# Patient Record
Sex: Female | Born: 1935 | Race: White | Hispanic: No | Smoking: Never smoker
Health system: Southern US, Community
[De-identification: ages and names within clinical notes are randomized; demographics above are authoritative.]

## PROBLEM LIST (undated history)

## (undated) DIAGNOSIS — F0390 Unspecified dementia without behavioral disturbance: Secondary | ICD-10-CM

## (undated) DIAGNOSIS — Z8051 Family history of malignant neoplasm of kidney: Secondary | ICD-10-CM

## (undated) DIAGNOSIS — Z8042 Family history of malignant neoplasm of prostate: Secondary | ICD-10-CM

## (undated) DIAGNOSIS — Z803 Family history of malignant neoplasm of breast: Secondary | ICD-10-CM

## (undated) DIAGNOSIS — C561 Malignant neoplasm of right ovary: Secondary | ICD-10-CM

## (undated) DIAGNOSIS — I10 Essential (primary) hypertension: Secondary | ICD-10-CM

## (undated) DIAGNOSIS — R112 Nausea with vomiting, unspecified: Secondary | ICD-10-CM

## (undated) DIAGNOSIS — I509 Heart failure, unspecified: Secondary | ICD-10-CM

## (undated) DIAGNOSIS — E785 Hyperlipidemia, unspecified: Secondary | ICD-10-CM

## (undated) DIAGNOSIS — T4145XA Adverse effect of unspecified anesthetic, initial encounter: Secondary | ICD-10-CM

## (undated) DIAGNOSIS — Z9889 Other specified postprocedural states: Secondary | ICD-10-CM

## (undated) DIAGNOSIS — T8859XA Other complications of anesthesia, initial encounter: Secondary | ICD-10-CM

## (undated) DIAGNOSIS — J910 Malignant pleural effusion: Secondary | ICD-10-CM

## (undated) DIAGNOSIS — N390 Urinary tract infection, site not specified: Secondary | ICD-10-CM

## (undated) DIAGNOSIS — Z8489 Family history of other specified conditions: Secondary | ICD-10-CM

## (undated) HISTORY — DX: Malignant pleural effusion: J91.0

## (undated) HISTORY — PX: INNER EAR SURGERY: SHX679

## (undated) HISTORY — PX: CYSTECTOMY: SUR359

## (undated) HISTORY — DX: Family history of malignant neoplasm of prostate: Z80.42

## (undated) HISTORY — DX: Urinary tract infection, site not specified: N39.0

## (undated) HISTORY — DX: Family history of malignant neoplasm of kidney: Z80.51

## (undated) HISTORY — DX: Malignant neoplasm of right ovary: C56.1

## (undated) HISTORY — PX: ABDOMINAL HYSTERECTOMY: SHX81

## (undated) HISTORY — DX: Family history of malignant neoplasm of breast: Z80.3

## (undated) HISTORY — DX: Hyperlipidemia, unspecified: E78.5

---

## 2012-07-21 ENCOUNTER — Ambulatory Visit: Payer: Self-pay | Admitting: Orthopedic Surgery

## 2016-03-27 ENCOUNTER — Other Ambulatory Visit: Payer: Self-pay | Admitting: Radiology

## 2016-12-01 ENCOUNTER — Emergency Department (HOSPITAL_COMMUNITY): Payer: Medicare Other

## 2016-12-01 ENCOUNTER — Encounter (HOSPITAL_COMMUNITY): Payer: Self-pay | Admitting: Emergency Medicine

## 2016-12-01 ENCOUNTER — Emergency Department (HOSPITAL_COMMUNITY)
Admission: EM | Admit: 2016-12-01 | Discharge: 2016-12-01 | Disposition: A | Discharge status: Home / Self Care | Payer: Medicare Other | Attending: Emergency Medicine | Admitting: Emergency Medicine

## 2016-12-01 DIAGNOSIS — I10 Essential (primary) hypertension: Secondary | ICD-10-CM | POA: Diagnosis not present

## 2016-12-01 DIAGNOSIS — F0390 Unspecified dementia without behavioral disturbance: Secondary | ICD-10-CM | POA: Diagnosis not present

## 2016-12-01 DIAGNOSIS — Z79899 Other long term (current) drug therapy: Secondary | ICD-10-CM | POA: Insufficient documentation

## 2016-12-01 DIAGNOSIS — Z7982 Long term (current) use of aspirin: Secondary | ICD-10-CM | POA: Diagnosis not present

## 2016-12-01 DIAGNOSIS — N390 Urinary tract infection, site not specified: Secondary | ICD-10-CM | POA: Diagnosis not present

## 2016-12-01 DIAGNOSIS — R110 Nausea: Secondary | ICD-10-CM | POA: Diagnosis present

## 2016-12-01 HISTORY — DX: Unspecified dementia, unspecified severity, without behavioral disturbance, psychotic disturbance, mood disturbance, and anxiety: F03.90

## 2016-12-01 HISTORY — DX: Essential (primary) hypertension: I10

## 2016-12-01 LAB — COMPREHENSIVE METABOLIC PANEL
ALBUMIN: 3.8 g/dL (ref 3.5–5.0)
ALK PHOS: 73 U/L (ref 38–126)
ALT: 21 U/L (ref 14–54)
AST: 27 U/L (ref 15–41)
Anion gap: 10 (ref 5–15)
BILIRUBIN TOTAL: 0.9 mg/dL (ref 0.3–1.2)
BUN: 8 mg/dL (ref 6–20)
CO2: 26 mmol/L (ref 22–32)
CREATININE: 0.73 mg/dL (ref 0.44–1.00)
Calcium: 9.8 mg/dL (ref 8.9–10.3)
Chloride: 94 mmol/L — ABNORMAL LOW (ref 101–111)
GFR calc Af Amer: >60 mL/min (ref >60)
GFR calc non Af Amer: >60 mL/min (ref >60)
GLUCOSE: 109 mg/dL — AB (ref 65–99)
POTASSIUM: 3.9 mmol/L (ref 3.5–5.1)
Sodium: 130 mmol/L — ABNORMAL LOW (ref 135–145)
TOTAL PROTEIN: 6.9 g/dL (ref 6.5–8.1)

## 2016-12-01 LAB — URINALYSIS, ROUTINE W REFLEX MICROSCOPIC
Bilirubin Urine: NEGATIVE
GLUCOSE, UA: NEGATIVE mg/dL
Hgb urine dipstick: NEGATIVE
KETONES UR: 5 mg/dL — AB
NITRITE: NEGATIVE
PROTEIN: NEGATIVE mg/dL
Specific Gravity, Urine: 1.009 (ref 1.005–1.030)
pH: 7 (ref 5.0–8.0)

## 2016-12-01 LAB — CBC WITH DIFFERENTIAL/PLATELET
BASOS ABS: 0 10*3/uL (ref 0.0–0.1)
BASOS PCT: 0 %
Eosinophils Absolute: 0 10*3/uL (ref 0.0–0.7)
Eosinophils Relative: 0 %
HCT: 37.3 % (ref 36.0–46.0)
HEMOGLOBIN: 13.1 g/dL (ref 12.0–15.0)
LYMPHS PCT: 21 %
Lymphs Abs: 1.3 10*3/uL (ref 0.7–4.0)
MCH: 31.2 pg (ref 26.0–34.0)
MCHC: 35.1 g/dL (ref 30.0–36.0)
MCV: 88.8 fL (ref 78.0–100.0)
Monocytes Absolute: 0.6 10*3/uL (ref 0.1–1.0)
Monocytes Relative: 9 %
NEUTROS ABS: 4.3 10*3/uL (ref 1.7–7.7)
NEUTROS PCT: 70 %
Platelets: 280 10*3/uL (ref 150–400)
RBC: 4.2 MIL/uL (ref 3.87–5.11)
RDW: 12.6 % (ref 11.5–15.5)
WBC: 6.1 10*3/uL (ref 4.0–10.5)

## 2016-12-01 LAB — TROPONIN I: Troponin I: <0.03 ng/mL (ref <0.03)

## 2016-12-01 LAB — LIPASE, BLOOD: Lipase: 26 U/L (ref 11–51)

## 2016-12-01 MED ORDER — CEPHALEXIN 500 MG PO CAPS: 500.0000 mg | ORAL_CAPSULE | Freq: Four times a day (QID) | ORAL | 0 refills | Status: DC

## 2016-12-01 MED ORDER — DEXTROSE 5 % IV SOLN
1.0000 g | Freq: Once | INTRAVENOUS | Status: AC
  Administered 2016-12-01: 1 g via INTRAVENOUS
  Filled 2016-12-01: qty 10

## 2016-12-01 MED ORDER — SODIUM CHLORIDE 0.9 % IV BOLUS (SEPSIS)
1000.0000 mL | Freq: Once | INTRAVENOUS | Status: AC
  Administered 2016-12-01: 1000 mL via INTRAVENOUS

## 2016-12-01 NOTE — ED Triage Notes (Signed)
Per family, patient has been c/o nausea x1 month with decrease in appetite. Patient also reports feeling hot but family states no fever and skin doesn't feel hot. Denies any vomiting, diarrhea, pain or urinary symptoms. Family states she just states "she doesn't feel good." Patient has hx of dementia but has not had any increase in confusion per family.

## 2016-12-01 NOTE — Discharge Instructions (Signed)
You have a urinary tract infection. Increase fluids. Prescription for antibiotic to start Monday morning.

## 2016-12-01 NOTE — ED Provider Notes (Signed)
[facility] DEPT Provider Note   CSN: [id number] Arrival date & time: 12/01/16  1117     History   Chief Complaint Chief Complaint  Patient presents with  . Nausea    HPI Denise Bonilla is a 83 y.o. female.  Level V caveat for dementia. Most of history obtained from 2 daughters. Family reports nausea for one month with poor appetite. She feels hot occasionally but no obvious fever. No weight loss, chest pain, dyspnea, cough, dysuria. Severity of symptoms is mild to moderate.      Past Medical History:  Diagnosis Date  . Dementia   . High cholesterol   . Hypertension     There are no active problems to display for this patient.   Past Surgical History:  Procedure Laterality Date  . CYSTECTOMY      OB History    Gravida Para Term Preterm AB Living   3 3 3     3    SAB TAB Ectopic Multiple Live Births                   Home Medications    Prior to Admission medications   Medication Sig Start Date End Date Taking? Authorizing Provider  aspirin EC 81 MG tablet Take 81 mg by mouth daily.   Yes [provider]  bisoprolol (ZEBETA) 10 MG tablet Take 10 mg by mouth daily.   Yes [provider]  lisinopril (PRINIVIL,ZESTRIL) 10 MG tablet Take 10 mg by mouth daily.   Yes [provider]  Multiple Vitamins-Minerals (MULTI FOR HER 50+) TABS Take 1 tablet by mouth daily.   Yes [provider]  Omega-3 Fatty Acids (FISH OIL) 500 MG CAPS Take 1 capsule by mouth daily.   Yes [provider]  simvastatin (ZOCOR) 20 MG tablet Take 20 mg by mouth at bedtime.    Yes [provider]  cephALEXin (KEFLEX) 500 MG capsule Take 1 capsule (500 mg total) by mouth 4 (four) times daily. 12/01/16   Nat Christen, MD    Family History Family History  Problem Relation Age of Onset  . Cancer Other   . Hyperlipidemia Other   . Hypertension Other   . Heart failure Mother     Social History Social History  Substance Use Topics  .  Smoking status: Never Smoker  . Smokeless tobacco: Never Used  . Alcohol use No     Allergies   Tetanus toxoids and Codeine   Review of Systems Review of Systems  Unable to perform ROS: Dementia (Dementia)     Physical Exam Updated Vital Signs BP (!) 168/71   Pulse 69   Temp 97.9 F (36.6 C) (Oral)   Ht 5\' 2"  (1.575 m)   Wt 72.6 kg (160 lb)   SpO2 95%   BMI 29.26 kg/m   Physical Exam  Constitutional: She is oriented to person, place, and time.  Pleasant, no acute distress  HENT:  Head: Normocephalic and atraumatic.  Eyes: Conjunctivae are normal.  Neck: Neck supple.  Cardiovascular: Normal rate and regular rhythm.   Pulmonary/Chest: Effort normal and breath sounds normal.  Abdominal: Soft. Bowel sounds are normal.  Musculoskeletal: Normal range of motion.  Neurological: She is alert and oriented to person, place, and time.  Skin: Skin is warm and dry.  Psychiatric:  Minimal dementia noted  Nursing note and vitals reviewed.    ED Treatments / Results  Labs (all labs ordered are listed, but only abnormal results are displayed)  Labs Reviewed  COMPREHENSIVE METABOLIC PANEL - Abnormal; Notable for the following:       Result Value   Sodium 130 (*)    Chloride 94 (*)    Glucose, Bld 109 (*)    All other components within normal limits  URINALYSIS, ROUTINE W REFLEX MICROSCOPIC - Abnormal; Notable for the following:    APPearance CLOUDY (*)    Ketones, ur 5 (*)    Leukocytes, UA MODERATE (*)    Bacteria, UA MANY (*)    Squamous Epithelial / LPF TOO NUMEROUS TO COUNT (*)    Non Squamous Epithelial 6-30 (*)    All other components within normal limits  URINE CULTURE  CBC WITH DIFFERENTIAL/PLATELET  LIPASE, BLOOD  TROPONIN I    EKG  EKG Interpretation  Date/Time:  Sunday December 01 2016 12:16:54 EDT Ventricular Rate:  66 PR Interval:    QRS Duration: 98 QT Interval:  398 QTC Calculation: 417 R Axis:   64 Text Interpretation:  Sinus rhythm Confirmed  by Nat Christen [phone]) on 12/01/2016 12:43:24 PM       Radiology Dg Chest 2 View  Result Date: 12/01/2016 CLINICAL DATA:  Weakness and not eating well.  Possible fever. EXAM: CHEST  2 VIEW COMPARISON:  None. FINDINGS: Lungs are adequately inflated with opacification over the lower 60% of the left thorax likely large effusion with associated atelectasis. Cannot exclude infection over the left mid to lower lung. Mild cardiomegaly. Minimal calcification over the thoracic aorta. Degenerative change of the spine. IMPRESSION: Moderate opacification over the left mid to lower lung likely large effusion with atelectasis. Cannot exclude infection in the left mid to lower lung. Recommend follow-up to resolution. Cardiomegaly. Electronically Signed   By: Marin Olp M.D.   On: 12/01/2016 13:16    Procedures Procedures (including critical care time)  Medications Ordered in ED Medications  sodium chloride 0.9 % bolus 1,000 mL (0 mLs Intravenous Stopping Infusion hung by another clincian 12/01/16 1405)  cefTRIAXone (ROCEPHIN) 1 g in dextrose 5 % 50 mL IVPB (1 g Intravenous New Bag/Given 12/01/16 1413)     Initial Impression / Assessment and Plan / ED Course  I have reviewed the triage vital signs and the nursing notes.  Pertinent labs & imaging results that were available during my care of the patient were reviewed by me and considered in my medical decision making (see chart for details).     Patient appears in no acute distress. She does have evidence of a urinary tract infection. IV Rocephin administered. Discharge medication Keflex 500 mg. This was discussed with the 2 daughters. Patient has primary care follow-up.  Final Clinical Impressions(s) / ED Diagnoses   Final diagnoses:  Urinary tract infection without hematuria, site unspecified    New Prescriptions New Prescriptions   CEPHALEXIN (KEFLEX) 500 MG CAPSULE    Take 1 capsule (500 mg total) by mouth 4 (four) times daily.     Nat Christen, MD 12/01/16 [phone]

## 2016-12-03 LAB — URINE CULTURE

## 2016-12-07 ENCOUNTER — Encounter (HOSPITAL_COMMUNITY): Payer: Self-pay | Admitting: *Deleted

## 2016-12-07 ENCOUNTER — Inpatient Hospital Stay (HOSPITAL_COMMUNITY)
Admission: EM | Admit: 2016-12-07 | Discharge: 2016-12-13 | DRG: 194 | Disposition: A | Discharge status: Home / Self Care | Payer: Medicare Other | Attending: Internal Medicine | Admitting: Internal Medicine

## 2016-12-07 ENCOUNTER — Emergency Department (HOSPITAL_COMMUNITY): Payer: Medicare Other

## 2016-12-07 ENCOUNTER — Other Ambulatory Visit: Payer: Self-pay

## 2016-12-07 DIAGNOSIS — C801 Malignant (primary) neoplasm, unspecified: Secondary | ICD-10-CM

## 2016-12-07 DIAGNOSIS — E876 Hypokalemia: Secondary | ICD-10-CM | POA: Diagnosis present

## 2016-12-07 DIAGNOSIS — J90 Pleural effusion, not elsewhere classified: Secondary | ICD-10-CM | POA: Diagnosis present

## 2016-12-07 DIAGNOSIS — E222 Syndrome of inappropriate secretion of antidiuretic hormone: Secondary | ICD-10-CM | POA: Diagnosis not present

## 2016-12-07 DIAGNOSIS — I5032 Chronic diastolic (congestive) heart failure: Secondary | ICD-10-CM | POA: Diagnosis present

## 2016-12-07 DIAGNOSIS — E042 Nontoxic multinodular goiter: Secondary | ICD-10-CM

## 2016-12-07 DIAGNOSIS — I110 Hypertensive heart disease with heart failure: Secondary | ICD-10-CM | POA: Diagnosis present

## 2016-12-07 DIAGNOSIS — E785 Hyperlipidemia, unspecified: Secondary | ICD-10-CM | POA: Diagnosis present

## 2016-12-07 DIAGNOSIS — R531 Weakness: Secondary | ICD-10-CM | POA: Diagnosis not present

## 2016-12-07 DIAGNOSIS — N390 Urinary tract infection, site not specified: Secondary | ICD-10-CM | POA: Diagnosis present

## 2016-12-07 DIAGNOSIS — J9811 Atelectasis: Secondary | ICD-10-CM | POA: Diagnosis present

## 2016-12-07 DIAGNOSIS — J181 Lobar pneumonia, unspecified organism: Secondary | ICD-10-CM | POA: Diagnosis present

## 2016-12-07 DIAGNOSIS — I509 Heart failure, unspecified: Secondary | ICD-10-CM | POA: Diagnosis not present

## 2016-12-07 DIAGNOSIS — Z9889 Other specified postprocedural states: Secondary | ICD-10-CM

## 2016-12-07 DIAGNOSIS — Z09 Encounter for follow-up examination after completed treatment for conditions other than malignant neoplasm: Secondary | ICD-10-CM

## 2016-12-07 DIAGNOSIS — F0390 Unspecified dementia without behavioral disturbance: Secondary | ICD-10-CM | POA: Diagnosis present

## 2016-12-07 DIAGNOSIS — I10 Essential (primary) hypertension: Secondary | ICD-10-CM

## 2016-12-07 DIAGNOSIS — Z7982 Long term (current) use of aspirin: Secondary | ICD-10-CM | POA: Diagnosis not present

## 2016-12-07 DIAGNOSIS — E869 Volume depletion, unspecified: Secondary | ICD-10-CM | POA: Diagnosis present

## 2016-12-07 DIAGNOSIS — I340 Nonrheumatic mitral (valve) insufficiency: Secondary | ICD-10-CM | POA: Diagnosis not present

## 2016-12-07 DIAGNOSIS — R188 Other ascites: Secondary | ICD-10-CM

## 2016-12-07 DIAGNOSIS — E041 Nontoxic single thyroid nodule: Secondary | ICD-10-CM | POA: Diagnosis present

## 2016-12-07 DIAGNOSIS — E871 Hypo-osmolality and hyponatremia: Secondary | ICD-10-CM | POA: Diagnosis present

## 2016-12-07 DIAGNOSIS — Z79899 Other long term (current) drug therapy: Secondary | ICD-10-CM

## 2016-12-07 LAB — URINALYSIS, ROUTINE W REFLEX MICROSCOPIC
Bacteria, UA: NONE SEEN
Bilirubin Urine: NEGATIVE
GLUCOSE, UA: NEGATIVE mg/dL
KETONES UR: NEGATIVE mg/dL
LEUKOCYTES UA: NEGATIVE
Nitrite: NEGATIVE
PH: 7 (ref 5.0–8.0)
Protein, ur: NEGATIVE mg/dL
Specific Gravity, Urine: 1.021 (ref 1.005–1.030)

## 2016-12-07 LAB — CBC
HCT: 37.1 % (ref 36.0–46.0)
Hemoglobin: 13 g/dL (ref 12.0–15.0)
MCH: 31.1 pg (ref 26.0–34.0)
MCHC: 35 g/dL (ref 30.0–36.0)
MCV: 88.8 fL (ref 78.0–100.0)
PLATELETS: 344 10*3/uL (ref 150–400)
RBC: 4.18 MIL/uL (ref 3.87–5.11)
RDW: 12.6 % (ref 11.5–15.5)
WBC: 6.5 10*3/uL (ref 4.0–10.5)

## 2016-12-07 LAB — BASIC METABOLIC PANEL
Anion gap: 7 (ref 5–15)
BUN: 5 mg/dL — AB (ref 6–20)
CHLORIDE: 92 mmol/L — AB (ref 101–111)
CO2: 24 mmol/L (ref 22–32)
CREATININE: 0.7 mg/dL (ref 0.44–1.00)
Calcium: 9.3 mg/dL (ref 8.9–10.3)
GFR calc Af Amer: >60 mL/min (ref >60)
GFR calc non Af Amer: >60 mL/min (ref >60)
Glucose, Bld: 118 mg/dL — ABNORMAL HIGH (ref 65–99)
Potassium: 4.7 mmol/L (ref 3.5–5.1)
SODIUM: 123 mmol/L — AB (ref 135–145)

## 2016-12-07 LAB — BRAIN NATRIURETIC PEPTIDE: B Natriuretic Peptide: 131.2 pg/mL — ABNORMAL HIGH (ref 0.0–100.0)

## 2016-12-07 LAB — OSMOLALITY, URINE: OSMOLALITY UR: 168 mosm/kg — AB (ref 300–900)

## 2016-12-07 LAB — ALBUMIN: ALBUMIN: 3.6 g/dL (ref 3.5–5.0)

## 2016-12-07 LAB — SODIUM, URINE, RANDOM: SODIUM UR: 36 mmol/L

## 2016-12-07 LAB — TROPONIN I: Troponin I: <0.03 ng/mL (ref <0.03)

## 2016-12-07 MED ORDER — IOPAMIDOL (ISOVUE-370) INJECTION 76%
INTRAVENOUS | Status: AC
  Administered 2016-12-07: 100 mL via INTRAVENOUS
  Filled 2016-12-07: qty 100

## 2016-12-07 MED ORDER — CEPHALEXIN 250 MG PO CAPS: 500.0000 mg | ORAL_CAPSULE | Freq: Four times a day (QID) | ORAL | Status: DC

## 2016-12-07 MED ORDER — MENTHOL 3 MG MT LOZG
1.0000 | LOZENGE | OROMUCOSAL | Status: DC | PRN
  Filled 2016-12-07: qty 9

## 2016-12-07 MED ORDER — SODIUM CHLORIDE 0.9% FLUSH: 3.0000 mL | INTRAVENOUS | Status: DC | PRN

## 2016-12-07 MED ORDER — ASPIRIN EC 81 MG PO TBEC
81.0000 mg | DELAYED_RELEASE_TABLET | Freq: Every day | ORAL | Status: DC
  Administered 2016-12-08 – 2016-12-13 (×6): 81 mg via ORAL
  Filled 2016-12-07 (×6): qty 1

## 2016-12-07 MED ORDER — ACETAMINOPHEN 325 MG PO TABS
650.0000 mg | ORAL_TABLET | Freq: Four times a day (QID) | ORAL | Status: DC | PRN
  Administered 2016-12-10: 650 mg via ORAL
  Filled 2016-12-07: qty 2

## 2016-12-07 MED ORDER — OMEGA-3-ACID ETHYL ESTERS 1 G PO CAPS
1.0000 g | ORAL_CAPSULE | Freq: Every day | ORAL | Status: DC
  Administered 2016-12-10 – 2016-12-13 (×4): 1 g via ORAL
  Filled 2016-12-07 (×6): qty 1

## 2016-12-07 MED ORDER — FUROSEMIDE 10 MG/ML IJ SOLN
40.0000 mg | Freq: Two times a day (BID) | INTRAMUSCULAR | Status: DC
  Administered 2016-12-07 – 2016-12-09 (×4): 40 mg via INTRAVENOUS
  Filled 2016-12-07 (×4): qty 4

## 2016-12-07 MED ORDER — ONDANSETRON HCL 4 MG/2ML IJ SOLN
4.0000 mg | Freq: Four times a day (QID) | INTRAMUSCULAR | Status: DC | PRN
  Administered 2016-12-09: 4 mg via INTRAVENOUS
  Filled 2016-12-07: qty 2

## 2016-12-07 MED ORDER — FUROSEMIDE 10 MG/ML IJ SOLN: 20.0000 mg | Freq: Once | INTRAMUSCULAR | Status: DC

## 2016-12-07 MED ORDER — ENOXAPARIN SODIUM 40 MG/0.4ML ~~LOC~~ SOLN
40.0000 mg | SUBCUTANEOUS | Status: DC
  Administered 2016-12-07 – 2016-12-12 (×6): 40 mg via SUBCUTANEOUS
  Filled 2016-12-07 (×6): qty 0.4

## 2016-12-07 MED ORDER — SODIUM CHLORIDE 0.9 % IV SOLN: 250.0000 mL | INTRAVENOUS | Status: DC | PRN

## 2016-12-07 MED ORDER — ACETAMINOPHEN 650 MG RE SUPP: 650.0000 mg | Freq: Four times a day (QID) | RECTAL | Status: DC | PRN

## 2016-12-07 MED ORDER — SIMVASTATIN 20 MG PO TABS
20.0000 mg | ORAL_TABLET | Freq: Every day | ORAL | Status: DC
  Administered 2016-12-07 – 2016-12-12 (×6): 20 mg via ORAL
  Filled 2016-12-07 (×6): qty 1

## 2016-12-07 MED ORDER — MULTI FOR HER 50+ PO TABS: 1.0000 | ORAL_TABLET | Freq: Every day | ORAL | Status: DC

## 2016-12-07 MED ORDER — ADULT MULTIVITAMIN W/MINERALS CH
1.0000 | ORAL_TABLET | Freq: Every day | ORAL | Status: DC
  Administered 2016-12-10 – 2016-12-13 (×4): 1 via ORAL
  Filled 2016-12-07 (×6): qty 1

## 2016-12-07 MED ORDER — FISH OIL 500 MG PO CAPS: 1.0000 | ORAL_CAPSULE | Freq: Every day | ORAL | Status: DC

## 2016-12-07 MED ORDER — SODIUM CHLORIDE 0.9% FLUSH
3.0000 mL | Freq: Two times a day (BID) | INTRAVENOUS | Status: DC
  Administered 2016-12-07 – 2016-12-13 (×9): 3 mL via INTRAVENOUS

## 2016-12-07 MED ORDER — ONDANSETRON HCL 4 MG PO TABS: 4.0000 mg | ORAL_TABLET | Freq: Four times a day (QID) | ORAL | Status: DC | PRN

## 2016-12-07 MED ORDER — CEPHALEXIN 500 MG PO CAPS
500.0000 mg | ORAL_CAPSULE | Freq: Four times a day (QID) | ORAL | Status: DC
  Administered 2016-12-08 (×3): 500 mg via ORAL
  Filled 2016-12-07: qty 1
  Filled 2016-12-07: qty 2
  Filled 2016-12-07 (×2): qty 1

## 2016-12-07 MED ORDER — SODIUM CHLORIDE 0.9% FLUSH
3.0000 mL | Freq: Two times a day (BID) | INTRAVENOUS | Status: DC
  Administered 2016-12-11 – 2016-12-13 (×2): 3 mL via INTRAVENOUS

## 2016-12-07 NOTE — ED Triage Notes (Signed)
Pt and family reports that pt was seen last week at [facility] for fatigue/weakness and diagnosed with UTI, has been taking antibiotics as prescribed. Family states pt is still not acting like she feels well and decreased appetite/intake. Pt having recent cough over past few days. Airway intact at triage.

## 2016-12-07 NOTE — ED Provider Notes (Signed)
[facility] DEPT Provider Note   CSN: [id number] Arrival date & time: 12/07/16  1202     History   Chief Complaint Chief Complaint  Patient presents with  . Weakness    HPI Denise Bonilla is a 83 y.o. female.  83 year old female with past mental history including dementia, hypertension, hyperlipidemia who presents with weakness. The patient was evaluated at [facility] on 6/24 for weakness and nausea. She was diagnosed with a UTI and started on Keflex. Tomorrow is her last dose of antibiotic and she has been compliant with medications. Family notes that the nausea improved with the patient has continued to act like she does not feel well and had decreased appetite and decreased PO intake. She has had mildly increased cough recently but she denies any shortness of breath. They have noticed that she is mildly dyspneic when she walks. She denies any vomiting, diarrhea, or complaints of pain. Family has noticed increased leg swelling recently. She told them that she felt hot last night but they have not had any measured fevers. She does not have a personal history of heart failure but strong family history of heart failure. Daughter thinks her weight might be up a few pounds.  LEVEL 5 CAVEAT DUE TO DEMENTIA   The history is provided by a relative.    Past Medical History:  Diagnosis Date  . Dementia   . High cholesterol   . Hypertension     There are no active problems to display for this patient.   Past Surgical History:  Procedure Laterality Date  . CYSTECTOMY      OB History    Gravida Para Term Preterm AB Living   3 3 3     3    SAB TAB Ectopic Multiple Live Births                   Home Medications    Prior to Admission medications   Medication Sig Start Date End Date Taking? Authorizing Provider  aspirin EC 81 MG tablet Take 81 mg by mouth daily.    [provider]  bisoprolol (ZEBETA) 10 MG tablet Take 10 mg by mouth daily.    [provider]   cephALEXin (KEFLEX) 500 MG capsule Take 1 capsule (500 mg total) by mouth 4 (four) times daily. 12/01/16   Nat Christen, MD  lisinopril (PRINIVIL,ZESTRIL) 10 MG tablet Take 10 mg by mouth daily.    [provider]  Multiple Vitamins-Minerals (MULTI FOR HER 50+) TABS Take 1 tablet by mouth daily.    [provider]  Omega-3 Fatty Acids (FISH OIL) 500 MG CAPS Take 1 capsule by mouth daily.    [provider]  simvastatin (ZOCOR) 20 MG tablet Take 20 mg by mouth at bedtime.     [provider]    Family History Family History  Problem Relation Age of Onset  . Cancer Other   . Hyperlipidemia Other   . Hypertension Other   . Heart failure Mother     Social History Social History  Substance Use Topics  . Smoking status: Never Smoker  . Smokeless tobacco: Never Used  . Alcohol use No     Allergies   Tetanus toxoids and Codeine   Review of Systems Review of Systems  Unable to perform ROS: Dementia     Physical Exam Updated Vital Signs BP (!) 157/72   Pulse (!) 59   Temp 97.9 F (36.6 C) (Oral)   Resp (!) 21  SpO2 95%   Physical Exam  Constitutional: She appears well-developed and well-nourished. No distress.  HENT:  Head: Normocephalic and atraumatic.  Eyes: Conjunctivae are normal. Pupils are equal, round, and reactive to light.  Neck: Neck supple.  Cardiovascular: Normal rate, regular rhythm and normal heart sounds.   No murmur heard. Pulmonary/Chest: Effort normal.  Diminished BS left lung in all lung fields  Abdominal: Soft. Bowel sounds are normal. She exhibits no distension. There is no tenderness.  Musculoskeletal: She exhibits edema (mild pitting BLE).  Neurological: She is alert.  Fluent speech, follows commands and answers questions appropriately  Skin: Skin is warm and dry.  Abrasion left shin  Psychiatric:  Calm, cooperative  Nursing note and vitals reviewed.    ED Treatments / Results  Labs (all labs ordered  are listed, but only abnormal results are displayed) Labs Reviewed  BASIC METABOLIC PANEL - Abnormal; Notable for the following:       Result Value   Sodium 123 (*)    Chloride 92 (*)    Glucose, Bld 118 (*)    BUN 5 (*)    All other components within normal limits  BRAIN NATRIURETIC PEPTIDE - Abnormal; Notable for the following:    B Natriuretic Peptide 131.2 (*)    All other components within normal limits  CBC  TROPONIN I  URINALYSIS, ROUTINE W REFLEX MICROSCOPIC    EKG  EKG Interpretation None       Radiology Dg Chest 2 View  Result Date: 12/07/2016 CLINICAL DATA:  Dry cough for 1 week. EXAM: CHEST  2 VIEW COMPARISON:  12/01/2016 FINDINGS: The cardiomediastinal silhouette is obscured by a large left pleural effusion. Calcific atherosclerotic disease of the aorta. No evidence of pneumothorax. The left pleural effusion has enlarged when compared to the prior radiograph, occupying approximately 2/3 of the left hemithorax. Osteoarthritic changes of the spine and kyphotic deformity, with mild chronic appearing compression deformity of multiple lower thoracic and upper lumbar vertebral bodies. Soft tissues are grossly normal. IMPRESSION: Enlarged left pleural effusion. The left lung parenchyma is mostly obscured by it. Small right pleural effusion.  Right lung clear. Calcific atherosclerotic disease of the aorta. Electronically Signed   By: Fidela Salisbury M.D.   On: 12/07/2016 12:59    Procedures Procedures (including critical care time)  Medications Ordered in ED Medications  iopamidol (ISOVUE-370) 76 % injection (100 mLs Intravenous Contrast Given 12/07/16 1603)     Initial Impression / Assessment and Plan / ED Course  I have reviewed the triage vital signs and the nursing notes.  Pertinent labs & imaging results that were available during my care of the patient were reviewed by me and considered in my medical decision making (see chart for details).     Pt w/ recent  evaluation for weakness, on keflex for UTI but with continued sx and decreased PO intake. She was comfortable on exam, vital signs notable for O2 saturation 95% on room air, afebrile, heart rate in the 60s, BP 166/67. She had significantly diminished breath sounds on left and pitting edema of lower legs. She denied any complaints.  Labwork is notable for sodium of 123, down from 130 on 6/24. Chloride 92, creatinine 0.7, normal CBC. Chest x-ray shows large left pleural effusion with small right pleural effusion. This was found on chest x-ray on 6/24 but appears to have enlarged since then. Obtained CT chest for better characterization given unilateral nature of effusion.   I'm signing out to the oncoming provider  who will follow-up on CT. I anticipate patient will require admission for diuresis versus thoracentesis of her pleural effusion. UA is still pending.  Final Clinical Impressions(s) / ED Diagnoses   Final diagnoses:  None    New Prescriptions New Prescriptions   No medications on file     Corianne Buccellato, Wenda Overland, MD 12/07/16 [phone]

## 2016-12-07 NOTE — ED Notes (Signed)
Attempted report x2 no answer.

## 2016-12-07 NOTE — Progress Notes (Signed)
Patient wants to use home medication, took medication to pharmacy and requested tonight's dose. Patient also complaining of feeling congested, will continue to monitor.

## 2016-12-07 NOTE — Progress Notes (Signed)
New Admission Note:   Arrival Method: ED bed with ED nurse tech and daughter. Mental Orientation: Alert and oriented to self, place, time Telemetry: initiated, box 4 Pain: denies Safety Measures: safety plan explained, signed Family: daughter at bedside  Orders to be reviewed and implemented. Will continue to monitor the patient. Call light has been placed within reach and bed alarm has been activated.   Riki Altes, RN Phone: [phone]

## 2016-12-07 NOTE — Progress Notes (Signed)
Patient and family requests a medication to be due later than MD ordered, changed the time per request.

## 2016-12-07 NOTE — H&P (Addendum)
History and Physical    Denise Bonilla ION:[id number] DOB: 07-17-1929 DOA: 12/07/2016    PCP: Monico Blitz, MD  Patient coming from: home  Chief Complaint: "not feeling well"  HPI: Denise Bonilla is a 83 y.o. female with medical history of dementia, HTN, HLD who is unable to give me a history due to dementia. She states she feels fine and is not sure why her daughters brought her here. Her daughters state that the patient has not been eating well for 1 month now. She was taken to the PCP but nothing was done about it. She was then taken to the [facility] on 6/24 and a UTI was found. The patient had been noted to have urinary urgency at home. She was started on keflex and the urgency resolved but she stopped drinking fluids as well. Daughters have noted that she has swelling in her ankles this week which she has never had before. They looked up her CXR in Mychart and found that she had a pleural effusion and thought that may have something to do with how she was feeling. They have not noted any cough or dyspnea.   ED Course:  BP elevated at 163/81, RR in 20s Sodium 123, Cl 92 CXR with large left pleural effusion and small right pleural effusion CT chest showing Large left and small right pleural effusions, collapse of the left lung, Interstitial pulmonary edema, Small volume abdominal ascites. Heterogeneous appearance of the thyroid gland, with right sided nodules.   Review of Systems:  Due to dementia/ poor short term memory unable to do ROS All other systems reviewed and apart from HPI, are negative.  Past Medical History:  Diagnosis Date  . Dementia   . High cholesterol   . Hypertension     Past Surgical History:  Procedure Laterality Date  . CYSTECTOMY      Social History:   reports that she has never smoked. She has never used smokeless tobacco. She reports that she does not drink alcohol or use drugs.  Apparently very active despite her dementia.   Allergies  Allergen  Reactions  . Tetanus Toxoids Anaphylaxis  . Codeine Nausea And Vomiting    Family History  Problem Relation Age of Onset  . Cancer Other   . Hyperlipidemia Other   . Hypertension Other   . Heart failure Mother      Prior to Admission medications   Medication Sig Start Date End Date Taking? Authorizing Provider  aspirin EC 81 MG tablet Take 81 mg by mouth daily.   Yes [provider]  bisoprolol (ZEBETA) 10 MG tablet Take 10 mg by mouth daily.   Yes [provider]  cephALEXin (KEFLEX) 500 MG capsule Take 1 capsule (500 mg total) by mouth 4 (four) times daily. 12/01/16  Yes Nat Christen, MD  lisinopril (PRINIVIL,ZESTRIL) 10 MG tablet Take 10 mg by mouth daily.   Yes [provider]  Multiple Vitamins-Minerals (MULTI FOR HER 50+) TABS Take 1 tablet by mouth daily.   Yes [provider]  Omega-3 Fatty Acids (FISH OIL) 500 MG CAPS Take 1 capsule by mouth daily.   Yes [provider]  simvastatin (ZOCOR) 20 MG tablet Take 20 mg by mouth at bedtime.    Yes [provider]    Physical Exam: Wt Readings from Last 3 Encounters:  12/01/16 72.6 kg (160 lb)   Vitals:   12/07/16 1645 12/07/16 1715 12/07/16 1745 12/07/16 1804  BP: (!) 159/73 (!) 164/77 Marland Kitchen)  162/85 139/75  Pulse: 63 64 69 65  Resp: (!) 22 (!) 26 19 (!) 24  Temp:      TempSrc:      SpO2: 94% 94% 96% 95%      Constitutional: NAD, calm, comfortable Eyes: PERTLA, lids and conjunctivae normal ENMT: Mucous membranes are moist. Posterior pharynx clear of any exudate or lesions. Normal dentition.  Neck: normal, supple, no masses, no thyromegaly Respiratory: poor breath sound in left lung field, no wheezing, no crackles. Normal respiratory effort. No accessory muscle use.  Cardiovascular: S1 & S2 heard, regular rate and rhythm, no murmurs / rubs / gallops. 2 + lower extremity edema. 2+ pedal pulses. No carotid bruits.  Abdomen: No distension, no tenderness, no masses palpated.  No hepatosplenomegaly. Bowel sounds normal.  Musculoskeletal: no clubbing / cyanosis. No joint deformity upper and lower extremities. Good ROM, no contractures. Normal muscle tone.  Skin: no rashes, lesions, ulcers. No induration Neurologic: CN 2-12 grossly intact. Sensation intact, DTR normal. Strength 5/5 in all 4 limbs.  Psychiatric:   Alert and oriented x 2 (person and place) Normal mood. Poor insight    Labs on Admission: I have personally reviewed following labs and imaging studies  CBC:  Recent Labs Lab 12/01/16 1235 12/07/16 1210  WBC 6.1 6.5  NEUTROABS 4.3  --   HGB 13.1 13.0  HCT 37.3 37.1  MCV 88.8 88.8  PLT 280 921   Basic Metabolic Panel:  Recent Labs Lab 12/01/16 1235 12/07/16 1210  NA 130* 123*  K 3.9 4.7  CL 94* 92*  CO2 26 24  GLUCOSE 109* 118*  BUN 8 5*  CREATININE 0.73 0.70  CALCIUM 9.8 9.3   GFR: Estimated Creatinine Clearance: 48 mL/min (by C-G formula based on SCr of 0.7 mg/dL). Liver Function Tests:  Recent Labs Lab 12/01/16 1235  AST 27  ALT 21  ALKPHOS 73  BILITOT 0.9  PROT 6.9  ALBUMIN 3.8    Recent Labs Lab 12/01/16 1235  LIPASE 26   No results for input(s): AMMONIA in the last 168 hours. Coagulation Profile: No results for input(s): INR, PROTIME in the last 168 hours. Cardiac Enzymes:  Recent Labs Lab 12/01/16 1235 12/07/16 1210  TROPONINI <0.03 <0.03   BNP (last 3 results) No results for input(s): PROBNP in the last 8760 hours. HbA1C: No results for input(s): HGBA1C in the last 72 hours. CBG: No results for input(s): GLUCAP in the last 168 hours. Lipid Profile: No results for input(s): CHOL, HDL, LDLCALC, TRIG, CHOLHDL, LDLDIRECT in the last 72 hours. Thyroid Function Tests: No results for input(s): TSH, T4TOTAL, FREET4, T3FREE, THYROIDAB in the last 72 hours. Anemia Panel: No results for input(s): VITAMINB12, FOLATE, FERRITIN, TIBC, IRON, RETICCTPCT in the last 72 hours. Urine analysis:    Component Value  Date/Time   COLORURINE YELLOW 12/01/2016 1235   APPEARANCEUR CLOUDY (A) 12/01/2016 1235   LABSPEC 1.009 12/01/2016 1235   PHURINE 7.0 12/01/2016 1235   GLUCOSEU NEGATIVE 12/01/2016 1235   HGBUR NEGATIVE 12/01/2016 1235   BILIRUBINUR NEGATIVE 12/01/2016 1235   KETONESUR 5 (A) 12/01/2016 1235   PROTEINUR NEGATIVE 12/01/2016 1235   NITRITE NEGATIVE 12/01/2016 1235   LEUKOCYTESUR MODERATE (A) 12/01/2016 1235   Sepsis Labs: @LABRCNTIP (procalcitonin:4,lacticidven:4) ) Recent Results (from the past 240 hour(s))  Urine culture     Status: Abnormal   Collection Time: 12/01/16 12:35 PM  Result Value Ref Range Status   Specimen Description URINE, CLEAN CATCH  Final   Special Requests NONE  Final   Culture MULTIPLE SPECIES PRESENT, SUGGEST RECOLLECTION (A)  Final   Report Status 12/03/2016 FINAL  Final     Radiological Exams on Admission: Dg Chest 2 View  Result Date: 12/07/2016 CLINICAL DATA:  Dry cough for 1 week. EXAM: CHEST  2 VIEW COMPARISON:  12/01/2016 FINDINGS: The cardiomediastinal silhouette is obscured by a large left pleural effusion. Calcific atherosclerotic disease of the aorta. No evidence of pneumothorax. The left pleural effusion has enlarged when compared to the prior radiograph, occupying approximately 2/3 of the left hemithorax. Osteoarthritic changes of the spine and kyphotic deformity, with mild chronic appearing compression deformity of multiple lower thoracic and upper lumbar vertebral bodies. Soft tissues are grossly normal. IMPRESSION: Enlarged left pleural effusion. The left lung parenchyma is mostly obscured by it. Small right pleural effusion.  Right lung clear. Calcific atherosclerotic disease of the aorta. Electronically Signed   By: Fidela Salisbury M.D.   On: 12/07/2016 12:59   Ct Angio Chest Pe W/cm &/or Wo Cm  Result Date: 12/07/2016 CLINICAL DATA:  Weakness.  Pleural effusions. EXAM: CT ANGIOGRAPHY CHEST WITH CONTRAST TECHNIQUE: Multidetector CT imaging of  the chest was performed using the standard protocol during bolus administration of intravenous contrast. Multiplanar CT image reconstructions and MIPs were obtained to evaluate the vascular anatomy. CONTRAST:  100 cc Isovue 370 intravenously. COMPARISON:  Chest radiograph 12/07/2016 FINDINGS: Cardiovascular: Satisfactory opacification of the pulmonary arteries to the segmental level. No evidence of pulmonary embolism. Normal heart size. No pericardial effusion. Aortic atherosclerosis. Mediastinum/Nodes: No enlarged mediastinal, hilar, or axillary lymph nodes. The trachea, and esophagus demonstrate no significant findings. Heterogeneous appearance of the thyroid gland. Lungs/Pleura: The bronchial tree to the level of the lobar branches is intact. There is large left and small right pleural effusion. Evaluation of the lung parenchyma as suboptimal due to motion artifact, but no pulmonary masses are seen within the aerated lung. There is significant compressive atelectasis of the left lung, secondary to the large pleural effusion with complete collapse of the left lower lobe, and partial collapse of the lingula. There is interstitial pulmonary edema. Upper Abdomen: Small volume abdominal ascites. Musculoskeletal: No chest wall abnormality. No acute or significant osseous findings. Review of the MIP images confirms the above findings. IMPRESSION: No evidence of pulmonary embolus. Large left and small right pleural effusions. Complete collapse of the left lower lobe, partial collapse of the lingula and milder atelectasis of the left upper lobe, likely due to compressive atelectasis. The collapsed lung parenchyma is poorly evaluated, but no obvious pulmonary masses are seen. Interstitial pulmonary edema. Small volume abdominal ascites. Heterogeneous appearance of the thyroid gland, with right sided nodules. Aortic Atherosclerosis (ICD10-I70.0). Electronically Signed   By: Fidela Salisbury M.D.   On: 12/07/2016 16:40     EKG: Independently reviewed. NSR when check on 6/24- repeating EKG now  Assessment/Plan Principal Problem:   Large Pleural effusion with compressive atelectasis in left lung, mild right pleural effusion Pedal edema, mild ascites on CT  - pulse ox 95% on room air-  no dyspnea  - at this point I am suspecting CHF - will obtain ECHO and start Lasix - hold B blocker and ACE I for now tomorrow AM's dose - follow I and O and daily weights - will need to repeat CXR when she has had adequate diuresis- I have explained to the patient's daughters if diuretics due not reduce the effusion, she might need a thoracentesis  Active Problems:   Hyponatremia - sodium was 130  on 6/24 - likely is due to fluid overload  -will check U sodium/ U osm as well    Multiple thyroid nodules   - obtain TSH and free T 4  lack of appetite/ generalized weakness - follow for improvement with treatment- PT eval  UTI - Keflex for 7 days was prescribed- tomorrow is the last dose - U culture showed multiple organisms and was unhelpful    HTN (hypertension) - as mentioned, will give lasix and hold Coreg and Lisinopril  Hyperlipidemia - resume statin  Dementia - very poor short term memory  DVT prophylaxis: Lovenox Code Status: Full code   Family Communication: daughters at bedside  Disposition Plan: admit to telemetry  Consults called: none  Admission status: admission    Debbe Odea MD [facility] Pager: [email or web address] Password [email or web address] 7PM-7AM, please contact night-coverage   12/07/2016, 6:08 PM

## 2016-12-08 ENCOUNTER — Inpatient Hospital Stay (HOSPITAL_COMMUNITY): Payer: Medicare Other

## 2016-12-08 DIAGNOSIS — E222 Syndrome of inappropriate secretion of antidiuretic hormone: Secondary | ICD-10-CM

## 2016-12-08 DIAGNOSIS — E876 Hypokalemia: Secondary | ICD-10-CM

## 2016-12-08 DIAGNOSIS — I340 Nonrheumatic mitral (valve) insufficiency: Secondary | ICD-10-CM

## 2016-12-08 LAB — ECHOCARDIOGRAM COMPLETE
HEIGHTINCHES: 59 in
WEIGHTICAEL: 2547.2 [oz_av]

## 2016-12-08 LAB — ALBUMIN, PLEURAL OR PERITONEAL FLUID: Albumin, Fluid: 2.7 g/dL

## 2016-12-08 LAB — BODY FLUID CELL COUNT WITH DIFFERENTIAL
Lymphs, Fluid: 55 %
MONOCYTE-MACROPHAGE-SEROUS FLUID: 37 % — AB (ref 50–90)
NEUTROPHIL FLUID: 8 % (ref 0–25)
OTHER CELLS FL: ABNORMAL %
WBC FLUID: 2025 uL — AB (ref 0–1000)

## 2016-12-08 LAB — TSH: TSH: 2.523 u[IU]/mL (ref 0.350–4.500)

## 2016-12-08 LAB — HEPATIC FUNCTION PANEL
ALBUMIN: 3.5 g/dL (ref 3.5–5.0)
ALT: 18 U/L (ref 14–54)
AST: 22 U/L (ref 15–41)
Alkaline Phosphatase: 66 U/L (ref 38–126)
Bilirubin, Direct: 0.1 mg/dL (ref 0.1–0.5)
Indirect Bilirubin: 0.7 mg/dL (ref 0.3–0.9)
TOTAL PROTEIN: 6.3 g/dL — AB (ref 6.5–8.1)
Total Bilirubin: 0.8 mg/dL (ref 0.3–1.2)

## 2016-12-08 LAB — LACTATE DEHYDROGENASE, PLEURAL OR PERITONEAL FLUID: LD, Fluid: 1529 U/L — ABNORMAL HIGH (ref 3–23)

## 2016-12-08 LAB — GRAM STAIN: GRAM STAIN: NONE SEEN

## 2016-12-08 LAB — BASIC METABOLIC PANEL
ANION GAP: 9 (ref 5–15)
Anion gap: 9 (ref 5–15)
BUN: 5 mg/dL — AB (ref 6–20)
BUN: 8 mg/dL (ref 6–20)
CALCIUM: 9 mg/dL (ref 8.9–10.3)
CHLORIDE: 88 mmol/L — AB (ref 101–111)
CHLORIDE: 86 mmol/L — AB (ref 101–111)
CO2: 26 mmol/L (ref 22–32)
CO2: 25 mmol/L (ref 22–32)
CREATININE: 0.82 mg/dL (ref 0.44–1.00)
Calcium: 8.9 mg/dL (ref 8.9–10.3)
Creatinine, Ser: 0.8 mg/dL (ref 0.44–1.00)
GFR calc Af Amer: >60 mL/min (ref >60)
GFR calc non Af Amer: >60 mL/min (ref >60)
GFR calc non Af Amer: >60 mL/min (ref >60)
Glucose, Bld: 91 mg/dL (ref 65–99)
Glucose, Bld: 163 mg/dL — ABNORMAL HIGH (ref 65–99)
POTASSIUM: 3.8 mmol/L (ref 3.5–5.1)
Potassium: 3.4 mmol/L — ABNORMAL LOW (ref 3.5–5.1)
SODIUM: 123 mmol/L — AB (ref 135–145)
SODIUM: 120 mmol/L — AB (ref 135–145)

## 2016-12-08 LAB — OSMOLALITY: OSMOLALITY: 260 mosm/kg — AB (ref 275–295)

## 2016-12-08 LAB — LACTATE DEHYDROGENASE: LDH: 222 U/L — AB (ref 98–192)

## 2016-12-08 LAB — T4, FREE: FREE T4: 1.37 ng/dL — AB (ref 0.61–1.12)

## 2016-12-08 LAB — PROTEIN, PLEURAL OR PERITONEAL FLUID: Total protein, fluid: 4.6 g/dL

## 2016-12-08 MED ORDER — DEXTROSE 5 % IV SOLN
1.0000 g | INTRAVENOUS | Status: DC
  Administered 2016-12-08 – 2016-12-12 (×5): 1 g via INTRAVENOUS
  Filled 2016-12-08 (×6): qty 10

## 2016-12-08 MED ORDER — LISINOPRIL 10 MG PO TABS
10.0000 mg | ORAL_TABLET | Freq: Every day | ORAL | Status: DC
  Administered 2016-12-09: 10 mg via ORAL
  Filled 2016-12-08: qty 1

## 2016-12-08 MED ORDER — LORAZEPAM 2 MG/ML IJ SOLN: 1.0000 mg | Freq: Four times a day (QID) | INTRAMUSCULAR | Status: AC | PRN

## 2016-12-08 MED ORDER — LIDOCAINE HCL (PF) 1 % IJ SOLN
INTRAMUSCULAR | Status: AC
  Administered 2016-12-08: 12:00:00
  Filled 2016-12-08: qty 30

## 2016-12-08 MED ORDER — POTASSIUM CHLORIDE CRYS ER 20 MEQ PO TBCR
40.0000 meq | EXTENDED_RELEASE_TABLET | Freq: Once | ORAL | Status: AC
  Administered 2016-12-08: 40 meq via ORAL
  Filled 2016-12-08: qty 2

## 2016-12-08 MED ORDER — BISOPROLOL FUMARATE 5 MG PO TABS
10.0000 mg | ORAL_TABLET | Freq: Every day | ORAL | Status: DC
  Administered 2016-12-09 – 2016-12-13 (×5): 10 mg via ORAL
  Filled 2016-12-08 (×5): qty 2

## 2016-12-08 MED ORDER — AZITHROMYCIN 500 MG PO TABS
500.0000 mg | ORAL_TABLET | Freq: Every day | ORAL | Status: DC
  Administered 2016-12-08 – 2016-12-13 (×6): 500 mg via ORAL
  Filled 2016-12-08 (×6): qty 1

## 2016-12-08 NOTE — Progress Notes (Signed)
Verbal orders received for ativan 1mg  IV prn q 6hours for agitation/confusion.   Once phone call ended, pt was calmed by family. Family did not believe pt needed it at this time, but wishes her to have it later in the evening.   Orders also received for abx, entered for pharmacy review.

## 2016-12-08 NOTE — Progress Notes (Signed)
Pharmacy is storing home medication.

## 2016-12-08 NOTE — Progress Notes (Signed)
  Echocardiogram 2D Echocardiogram has been performed.  Denise Bonilla 12/08/2016, 2:47 PM

## 2016-12-08 NOTE — Procedures (Signed)
Ultrasound-guided diagnostic and therapeutic left thoracentesis performed yielding 1.6 liters of dark serous colored fluid. No immediate complications.  Procedure terminated early secondary to coughing Follow-up chest x-ray pending.      Denise Bonilla E 11:42 AM 12/08/2016

## 2016-12-08 NOTE — Progress Notes (Signed)
Patient complaining of feeling really hot, it comes and goes like hot flashes. Daughter says she has been having these periods of suddenly getting really hot for a little over a week.

## 2016-12-08 NOTE — Progress Notes (Signed)
Consent signed for thoracentesis. Pts medications held until post-procedure.   Consent signed by pt's daughter Shirlean Mylar.

## 2016-12-08 NOTE — Progress Notes (Signed)
I went in at 6:05pm pt is sun downing and is confused, the nurse is in the room answering her questions and the lab tech is in her room trying to draw blood. The pt wasn't feeling well earlier, she purked up around 4pm and was walking in the hallway with her daughter.

## 2016-12-08 NOTE — Progress Notes (Signed)
Family expresses concern about reduced food intake and concern for inadequate nutrition. Maud Deed Tobias-Diakun,RN

## 2016-12-08 NOTE — Progress Notes (Signed)
Patient keeps asking for drinks (coffee, water). Explained to her daughter that there is a fluid restriction in order to aid in reducing extra fluid buildup.

## 2016-12-08 NOTE — Progress Notes (Signed)
CCMD notified of pt to procedure, placed on standby.

## 2016-12-08 NOTE — Progress Notes (Signed)
Pt is gone to procedure, ultrasound guided left thoracentesis.

## 2016-12-08 NOTE — Progress Notes (Signed)
Page to Dr Clementeen Graham:   3e10 pt becoming increasingly confused/agitated demanding to leave. family requesting medication to assist in calming pt down. Pt requesting MD at bedside.

## 2016-12-08 NOTE — Progress Notes (Signed)
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

## 2016-12-08 NOTE — Progress Notes (Signed)
PROGRESS NOTE                                                                                                                                                                                                             Patient Demographics:    Denise Bonilla, is a 83 y.o. female, DOB - 1930/04/04, FKC:[id number]  Admit date - 12/07/2016   Admitting Physician Debbe Odea, MD  Outpatient Primary MD for the patient is Monico Blitz, MD  LOS - 1  Outpatient Specialists:None  Chief Complaint  Patient presents with  . Weakness       Brief Narrative   83 year old female with dementia, hypertension, hyperlipidemia brought to the ED by her daughter as she has been feeling weak for past 1 month. She also has had very poor appetite. She took patient to her PCP one month back and was given antibiotics for possible UTI. She then took her to [facility] ED on 6/24 since patient was increasingly weak and was wobbly on walking. She was found to have UTI and discharged on Keflex. At that time she had a chest x-ray done which showed moderate left pleural effusion. As the patient was not feeling well the daughter looked up her chest x-ray in my chart and found she had pleural effusion and was brought to the ED. Patient was also found to have leg swelling for past few weeks.  No history of fever, dizziness, loss of consciousness, chest pain, complains of shortness of breath, abdominal pain, diarrhea or dysuria. No new medication besides antibiotics. No history of fall.  In the ED she was hypertensive. Labs showed sodium of 123, chloride of 92, chest x-ray showing large left pleural effusion and small right pleural effusion. CT chest again showed large left and small right pleural effusion with collapse of the left lung, interstitial pulmonary edema and small abdominal ascites. BNP of 131. Admitted to hospitalist service for further management.   Subjective:   Patient unable to provide much history. Denies any chest pain or shortness of breath.   Assessment  & Plan :   Principal problem Left-sided pleural effusion Thoracentesis done with 1.6 L.status fluid removed. Exudative on preliminary pleural fluid analysis (high pleural  fluid WBC of 2K, LDH >1500). Check serum LDH and protein. Follow pleural fluid culture and cytology. No lung mass seen on  CT. -Continue IV Lasix for now. Monitor strict I/O and daily weight. Fluid restriction to 1 L daily. Check 2-D echo. Continue fluid restriction.  Hyponatremia Possibly SIADH vs hypervolemic hyponatremia.. Low serum and urine osmolality. Urine sodium of 36. Monitor with fluid restriction and Lasix. Monitor sodium level in the evening.  Hypokalemia Replenish.  Essential hypertension Continue lisinopril and bisoprolol.  Hyperlipidemia Continue statin.   Dementia, moderate to severe  Recently diagnosed UTI. Has been on antibiotics in 6/24. Will discontinue.   Code Status : Full code  Family Communication  : Daughter at bedside  Disposition Plan  : Home once improved  Barriers For Discharge : Active symptoms  Consults  : None  Procedures  :  CT angiogram of the chest Left thoracentesis 2-D echo  DVT Prophylaxis  :  Lovenox -   Lab Results  Component Value Date   PLT 344 12/07/2016    Antibiotics  :    Anti-infectives    Start     Dose/Rate Route Frequency Ordered Stop   12/07/16 1815  cephALEXin (KEFLEX) capsule 500 mg     500 mg Oral 4 times daily 12/07/16 1801 12/08/16 2359   12/07/16 1800  cephALEXin (KEFLEX) capsule 500 mg  Status:  Discontinued     500 mg Oral 4 times daily 12/07/16 1752 12/07/16 1801        Objective:   Vitals:   12/08/16 0200 12/08/16 0600 12/08/16 1122 12/08/16 1137  BP: (!) 157/76 (!) 152/70 (!) 143/76 136/67  Pulse: 73 67    Resp: (!) 22 18    Temp: 98 F (36.7 C) 98.2 F (36.8 C)    TempSrc: Oral Oral    SpO2: 98% 98%     Weight:  72.2 kg (159 lb 3.2 oz)    Height:        Wt Readings from Last 3 Encounters:  12/08/16 72.2 kg (159 lb 3.2 oz)  12/01/16 72.6 kg (160 lb)     Intake/Output Summary (Last 24 hours) at 12/08/16 1351 Last data filed at 12/08/16 0927  Gross per 24 hour  Intake             1423 ml  Output             3300 ml  Net            -1877 ml     Physical Exam  Gen.: Elderly female not in distress HEENT: Mucosa, supple neck, no JVD Chest: Diminished left-sided breath sounds CVS: Normal S1 and S2, no murmurs GI: Soft, nondistended, nontender  musculoskeletal: Warm, trace pitting edema bilaterally   Labs  CBC  Recent Labs Lab 12/07/16 1210  WBC 6.5  HGB 13.0  HCT 37.1  PLT 344  MCV 88.8  MCH 31.1  MCHC 35.0  RDW 12.6    Chemistries   Recent Labs Lab 12/07/16 1210 12/08/16 0253  NA 123* 123*  K 4.7 3.4*  CL 92* 88*  CO2 24 26  GLUCOSE 118* 91  BUN 5* 5*  CREATININE 0.70 0.80  CALCIUM 9.3 9.0   ------------------------------------------------------------------------------------------------------------------ No results for input(s): CHOL, HDL, LDLCALC, TRIG, CHOLHDL, LDLDIRECT in the last 72 hours.  No results found for: HGBA1C ------------------------------------------------------------------------------------------------------------------  Recent Labs  12/08/16 0253  TSH 2.523   ------------------------------------------------------------------------------------------------------------------ No results for input(s): VITAMINB12, FOLATE, FERRITIN, TIBC, IRON, RETICCTPCT in the last 72 hours.  Coagulation profile No results for input(s): INR, PROTIME in the last 168 hours.  No results for input(s):  DDIMER in the last 72 hours.  Cardiac Enzymes  Recent Labs Lab 12/07/16 1210  TROPONINI <0.03   ------------------------------------------------------------------------------------------------------------------    Component Value Date/Time    BNP 131.2 (H) 12/07/2016 1352    Inpatient Medications  Scheduled Meds: . aspirin EC  81 mg Oral Daily  . cephALEXin  500 mg Oral QID  . enoxaparin (LOVENOX) injection  40 mg Subcutaneous Q24H  . furosemide  40 mg Intravenous Q12H  . multivitamin with minerals  1 tablet Oral Daily  . omega-3 acid ethyl esters  1 g Oral Daily  . simvastatin  20 mg Oral QHS  . sodium chloride flush  3 mL Intravenous Q12H  . sodium chloride flush  3 mL Intravenous Q12H   Continuous Infusions: . sodium chloride     PRN Meds:.sodium chloride, acetaminophen **OR** acetaminophen, menthol-cetylpyridinium, ondansetron **OR** ondansetron (ZOFRAN) IV, sodium chloride flush  Micro Results Recent Results (from the past 240 hour(s))  Urine culture     Status: Abnormal   Collection Time: 12/01/16 12:35 PM  Result Value Ref Range Status   Specimen Description URINE, CLEAN CATCH  Final   Special Requests NONE  Final   Culture MULTIPLE SPECIES PRESENT, SUGGEST RECOLLECTION (A)  Final   Report Status 12/03/2016 FINAL  Final    Radiology Reports Dg Chest 1 View  Result Date: 12/08/2016 CLINICAL DATA:  Post thoracentesis EXAM: CHEST 1 VIEW COMPARISON:  12/08/2016 FINDINGS: Moderate-sized left pleural effusion, decreased following thoracentesis. No pneumothorax. Improving aeration in the left lung with continued left lower lobe atelectasis or infiltrate. No confluent opacity on the right. IMPRESSION: Decreasing left effusion following thoracentesis.  No pneumothorax. Electronically Signed   By: Rolm Baptise M.D.   On: 12/08/2016 12:03   Dg Chest 2 View  Result Date: 12/07/2016 CLINICAL DATA:  Dry cough for 1 week. EXAM: CHEST  2 VIEW COMPARISON:  12/01/2016 FINDINGS: The cardiomediastinal silhouette is obscured by a large left pleural effusion. Calcific atherosclerotic disease of the aorta. No evidence of pneumothorax. The left pleural effusion has enlarged when compared to the prior radiograph, occupying  approximately 2/3 of the left hemithorax. Osteoarthritic changes of the spine and kyphotic deformity, with mild chronic appearing compression deformity of multiple lower thoracic and upper lumbar vertebral bodies. Soft tissues are grossly normal. IMPRESSION: Enlarged left pleural effusion. The left lung parenchyma is mostly obscured by it. Small right pleural effusion.  Right lung clear. Calcific atherosclerotic disease of the aorta. Electronically Signed   By: Fidela Salisbury M.D.   On: 12/07/2016 12:59   Dg Chest 2 View  Result Date: 12/01/2016 CLINICAL DATA:  Weakness and not eating well.  Possible fever. EXAM: CHEST  2 VIEW COMPARISON:  None. FINDINGS: Lungs are adequately inflated with opacification over the lower 60% of the left thorax likely large effusion with associated atelectasis. Cannot exclude infection over the left mid to lower lung. Mild cardiomegaly. Minimal calcification over the thoracic aorta. Degenerative change of the spine. IMPRESSION: Moderate opacification over the left mid to lower lung likely large effusion with atelectasis. Cannot exclude infection in the left mid to lower lung. Recommend follow-up to resolution. Cardiomegaly. Electronically Signed   By: Marin Olp M.D.   On: 12/01/2016 13:16   Ct Angio Chest Pe W/cm &/or Wo Cm  Result Date: 12/07/2016 CLINICAL DATA:  Weakness.  Pleural effusions. EXAM: CT ANGIOGRAPHY CHEST WITH CONTRAST TECHNIQUE: Multidetector CT imaging of the chest was performed using the standard protocol during bolus administration of intravenous contrast. Multiplanar CT  image reconstructions and MIPs were obtained to evaluate the vascular anatomy. CONTRAST:  100 cc Isovue 370 intravenously. COMPARISON:  Chest radiograph 12/07/2016 FINDINGS: Cardiovascular: Satisfactory opacification of the pulmonary arteries to the segmental level. No evidence of pulmonary embolism. Normal heart size. No pericardial effusion. Aortic atherosclerosis.  Mediastinum/Nodes: No enlarged mediastinal, hilar, or axillary lymph nodes. The trachea, and esophagus demonstrate no significant findings. Heterogeneous appearance of the thyroid gland. Lungs/Pleura: The bronchial tree to the level of the lobar branches is intact. There is large left and small right pleural effusion. Evaluation of the lung parenchyma as suboptimal due to motion artifact, but no pulmonary masses are seen within the aerated lung. There is significant compressive atelectasis of the left lung, secondary to the large pleural effusion with complete collapse of the left lower lobe, and partial collapse of the lingula. There is interstitial pulmonary edema. Upper Abdomen: Small volume abdominal ascites. Musculoskeletal: No chest wall abnormality. No acute or significant osseous findings. Review of the MIP images confirms the above findings. IMPRESSION: No evidence of pulmonary embolus. Large left and small right pleural effusions. Complete collapse of the left lower lobe, partial collapse of the lingula and milder atelectasis of the left upper lobe, likely due to compressive atelectasis. The collapsed lung parenchyma is poorly evaluated, but no obvious pulmonary masses are seen. Interstitial pulmonary edema. Small volume abdominal ascites. Heterogeneous appearance of the thyroid gland, with right sided nodules. Aortic Atherosclerosis (ICD10-I70.0). Electronically Signed   By: Fidela Salisbury M.D.   On: 12/07/2016 16:40   Dg Chest Port 1 View  Result Date: 12/08/2016 CLINICAL DATA:  Left pleural effusion EXAM: PORTABLE CHEST 1 VIEW COMPARISON:  12/07/2016 FINDINGS: Large left pleural effusion with left lower lobe atelectasis, unchanged. Probable cardiomegaly. Right lung is clear. No change since prior study. IMPRESSION: Stable large left pleural effusion with left lung atelectasis. Electronically Signed   By: Rolm Baptise M.D.   On: 12/08/2016 11:10    Time Spent in minutes  35   Louellen Molder M.D on 12/08/2016 at 1:51 PM  Between 7am to 7pm - Pager - [phone]  After 7pm go to [email or web address] - password [facility]  [facility] -  Office  [phone]

## 2016-12-08 NOTE — Progress Notes (Signed)
PT Cancellation Note  Patient Details Name: Denise Bonilla MRN: [id number] DOB: 09-Feb-1930   Cancelled Treatment:    Reason Eval/Treat Not Completed: Patient at procedure or test/unavailable.   Duncan Dull 12/08/2016, 11:24 AM Alben Deeds, PT DPT [facility] [phone]

## 2016-12-09 ENCOUNTER — Inpatient Hospital Stay (HOSPITAL_COMMUNITY): Payer: Medicare Other

## 2016-12-09 LAB — URINALYSIS, COMPLETE (UACMP) WITH MICROSCOPIC
Bilirubin Urine: NEGATIVE
GLUCOSE, UA: NEGATIVE mg/dL
HGB URINE DIPSTICK: NEGATIVE
Ketones, ur: NEGATIVE mg/dL
Leukocytes, UA: NEGATIVE
NITRITE: NEGATIVE
PROTEIN: NEGATIVE mg/dL
SPECIFIC GRAVITY, URINE: 1.015 (ref 1.005–1.030)
pH: 5 (ref 5.0–8.0)

## 2016-12-09 LAB — BASIC METABOLIC PANEL
Anion gap: 10 (ref 5–15)
Anion gap: 11 (ref 5–15)
BUN: 8 mg/dL (ref 6–20)
BUN: 10 mg/dL (ref 6–20)
CALCIUM: 8.7 mg/dL — AB (ref 8.9–10.3)
CHLORIDE: 88 mmol/L — AB (ref 101–111)
CHLORIDE: 85 mmol/L — AB (ref 101–111)
CO2: 27 mmol/L (ref 22–32)
CO2: 25 mmol/L (ref 22–32)
CREATININE: 0.75 mg/dL (ref 0.44–1.00)
CREATININE: 0.85 mg/dL (ref 0.44–1.00)
Calcium: 9.1 mg/dL (ref 8.9–10.3)
GFR calc non Af Amer: >60 mL/min (ref >60)
Glucose, Bld: 95 mg/dL (ref 65–99)
Glucose, Bld: 138 mg/dL — ABNORMAL HIGH (ref 65–99)
POTASSIUM: 3.7 mmol/L (ref 3.5–5.1)
Potassium: 4 mmol/L (ref 3.5–5.1)
SODIUM: 121 mmol/L — AB (ref 135–145)
Sodium: 125 mmol/L — ABNORMAL LOW (ref 135–145)

## 2016-12-09 LAB — CORTISOL: CORTISOL PLASMA: 24.1 ug/dL

## 2016-12-09 LAB — SODIUM, URINE, RANDOM

## 2016-12-09 LAB — OSMOLALITY, URINE: Osmolality, Ur: 368 mOsm/kg (ref 300–900)

## 2016-12-09 LAB — OSMOLALITY: Osmolality: 255 mOsm/kg — ABNORMAL LOW (ref 275–295)

## 2016-12-09 LAB — CREATININE, URINE, RANDOM: CREATININE, URINE: 163.49 mg/dL

## 2016-12-09 MED ORDER — SODIUM CHLORIDE 0.9 % IV SOLN: INTRAVENOUS | Status: DC

## 2016-12-09 NOTE — Progress Notes (Signed)
PROGRESS NOTE                                                                                                                                                                                                             Patient Demographics:    Denise Bonilla, is a 83 y.o. female, DOB - [birth year], ZOX:[id number]  Admit date - 12/07/2016   Admitting Physician Debbe Odea, MD  Outpatient Primary MD for the patient is Monico Blitz, MD  LOS - 2  Outpatient Specialists:None  Chief Complaint  Patient presents with  . Weakness       Brief Narrative   83 year old female with dementia, hypertension, hyperlipidemia brought to the ED by her daughter as she has been feeling weak for past 1 month. She also has had very poor appetite. She took patient to her PCP one month back and was given antibiotics for possible UTI. She then took her to [facility] ED on 6/24 since patient was increasingly weak and was wobbly on walking. She was found to have UTI and discharged on Keflex. At that time she had a chest x-ray done which showed moderate left pleural effusion. As the patient was not feeling well the daughter looked up her chest x-ray in my chart and found she had pleural effusion and was brought to the ED. Patient was also found to have leg swelling for past few weeks.  No history of fever, dizziness, loss of consciousness, chest pain, complains of shortness of breath, abdominal pain, diarrhea or dysuria. No new medication besides antibiotics. No history of fall.  In the ED she was hypertensive. Labs showed sodium of 123, chloride of 92, chest x-ray showing large left pleural effusion and small right pleural effusion. CT chest again showed large left and small right pleural effusion with collapse of the left lung, interstitial pulmonary edema and small abdominal ascites. BNP of 131. Admitted to hospitalist service for further management.   Subjective:   She knows that access yesterday and wanting to go home. Time done after receiving some Ativan.   Assessment  & Plan :   Principal problem Left-sided pleural effusion Thoracentesis done with 1.6 L.status fluid removed. Appears exudative. (? Parapneumonic versus malignant) Follow fluid culture and cytology.  No lung mass seen on CT.  2-D echo with EF of 65-70% and no wall motion  abnormality, grade 1 diastolic dysfunction. DC IV Lasix and fluid restriction.  ? Lobar pneumonia with parapneumonic effusion. -Exudative effusion noted. treat empirically with Rocephin and azithromycin. Follow fluid for culture.  Hyponatremia Possibly hypovolemic given poor by mouth intake. Has low serum and urine osmolality and normal urine sodium. Did not show improvement with IV Lasix and fluid restriction. Have discontinued both. Nephrology consulted. May need IV fluids.   Hypokalemia Replenished  Essential hypertension Continue bisoprolol. Lisinopril held.  Hyperlipidemia Continue statin.   Dementia, moderate to severe  Recent UTI. Was treated with Keflex.  Thyroid nodule Daughter reports patient having hot flashes. TSH normal 1 free T4 is mildly elevated (1.7). Right-sided nodule seen on CT angiogram of the chest. Check thyroid ultrasound.    Code Status : Full code  Family Communication  : Daughter at bedside  Disposition Plan  : Home once improved (lung effusion, shortness of breath and hyponatremia)  Barriers For Discharge : Active symptoms  Consults  : Nephrology  Procedures  :  CT angiogram of the chest Left thoracentesis 2-D echo  DVT Prophylaxis  :  Lovenox -   Lab Results  Component Value Date   PLT 344 12/07/2016    Antibiotics  :    Anti-infectives    Start     Dose/Rate Route Frequency Ordered Stop   12/08/16 2000  cefTRIAXone (ROCEPHIN) 1 g in dextrose 5 % 50 mL IVPB     1 g 100 mL/hr over 30 Minutes Intravenous Every 24 hours 12/08/16 1902      12/08/16 1915  azithromycin (ZITHROMAX) tablet 500 mg     500 mg Oral Daily 12/08/16 1902     12/07/16 1815  cephALEXin (KEFLEX) capsule 500 mg  Status:  Discontinued     500 mg Oral 4 times daily 12/07/16 1801 12/08/16 1901   12/07/16 1800  cephALEXin (KEFLEX) capsule 500 mg  Status:  Discontinued     500 mg Oral 4 times daily 12/07/16 1752 12/07/16 1801        Objective:   Vitals:   12/08/16 2023 12/08/16 2353 12/09/16 0436 12/09/16 0622  BP: (!) 116/53 137/66 (!) 110/48   Pulse: 77 80 76   Resp: 17 16 15    Temp: 97.9 F (36.6 C) 97.9 F (36.6 C) 97.4 F (36.3 C)   TempSrc: Oral Oral Oral   SpO2: 94% 93% 93%   Weight:    70.8 kg (156 lb 1.4 oz)  Height:        Wt Readings from Last 3 Encounters:  12/09/16 70.8 kg (156 lb 1.4 oz)  12/01/16 72.6 kg (160 lb)     Intake/Output Summary (Last 24 hours) at 12/09/16 1019 Last data filed at 12/09/16 0940  Gross per 24 hour  Intake              536 ml  Output             1700 ml  Net            -1164 ml     Physical Exam Gen.: Elderly female not in distress, pleasant HEENT: Moist mucosa, supple neck Chest: Improved left-sided breath sounds, fine crackles This: S1 and S2, no murmurs GI: Soft, nondistended, nontender Musculoskeletal: Warm, trace pitting edema bilaterally  Labs  CBC  Recent Labs Lab 12/07/16 1210  WBC 6.5  HGB 13.0  HCT 37.1  PLT 344  MCV 88.8  MCH 31.1  MCHC 35.0  RDW 12.6    Chemistries  Recent Labs Lab 12/07/16 1210 12/08/16 0253 12/08/16 1455 12/08/16 1814 12/09/16 0706  NA 123* 123*  --  120* 125*  K 4.7 3.4*  --  3.8 3.7  CL 92* 88*  --  86* 88*  CO2 24 26  --  25 27  GLUCOSE 118* 91  --  163* 95  BUN 5* 5*  --  8 8  CREATININE 0.70 0.80  --  0.82 0.75  CALCIUM 9.3 9.0  --  8.9 9.1  AST  --   --  22  --   --   ALT  --   --  18  --   --   ALKPHOS  --   --  66  --   --   BILITOT  --   --  0.8  --   --     ------------------------------------------------------------------------------------------------------------------ No results for input(s): CHOL, HDL, LDLCALC, TRIG, CHOLHDL, LDLDIRECT in the last 72 hours.  No results found for: HGBA1C ------------------------------------------------------------------------------------------------------------------  Recent Labs  12/08/16 0253  TSH 2.523   ------------------------------------------------------------------------------------------------------------------ No results for input(s): VITAMINB12, FOLATE, FERRITIN, TIBC, IRON, RETICCTPCT in the last 72 hours.  Coagulation profile No results for input(s): INR, PROTIME in the last 168 hours.  No results for input(s): DDIMER in the last 72 hours.  Cardiac Enzymes  Recent Labs Lab 12/07/16 1210  TROPONINI <0.03   ------------------------------------------------------------------------------------------------------------------    Component Value Date/Time   BNP 131.2 (H) 12/07/2016 1352    Inpatient Medications  Scheduled Meds: . aspirin EC  81 mg Oral Daily  . azithromycin  500 mg Oral Daily  . bisoprolol  10 mg Oral Daily  . enoxaparin (LOVENOX) injection  40 mg Subcutaneous Q24H  . furosemide  40 mg Intravenous Q12H  . lisinopril  10 mg Oral Daily  . multivitamin with minerals  1 tablet Oral Daily  . omega-3 acid ethyl esters  1 g Oral Daily  . simvastatin  20 mg Oral QHS  . sodium chloride flush  3 mL Intravenous Q12H  . sodium chloride flush  3 mL Intravenous Q12H   Continuous Infusions: . sodium chloride    . cefTRIAXone (ROCEPHIN)  IV Stopped (12/08/16 2118)   PRN Meds:.sodium chloride, acetaminophen **OR** acetaminophen, LORazepam, menthol-cetylpyridinium, ondansetron **OR** ondansetron (ZOFRAN) IV, sodium chloride flush  Micro Results Recent Results (from the past 240 hour(s))  Urine culture     Status: Abnormal   Collection Time: 12/01/16 12:35 PM  Result  Value Ref Range Status   Specimen Description URINE, CLEAN CATCH  Final   Special Requests NONE  Final   Culture MULTIPLE SPECIES PRESENT, SUGGEST RECOLLECTION (A)  Final   Report Status 12/03/2016 FINAL  Final  Gram stain     Status: None   Collection Time: 12/08/16 11:32 AM  Result Value Ref Range Status   Specimen Description PLEURAL LEFT  Final   Special Requests NONE  Final   Gram Stain NO WBC SEEN NO ORGANISMS SEEN   Final   Report Status 12/08/2016 FINAL  Final    Radiology Reports Dg Chest 1 View  Result Date: 12/08/2016 CLINICAL DATA:  Post thoracentesis EXAM: CHEST 1 VIEW COMPARISON:  12/08/2016 FINDINGS: Moderate-sized left pleural effusion, decreased following thoracentesis. No pneumothorax. Improving aeration in the left lung with continued left lower lobe atelectasis or infiltrate. No confluent opacity on the right. IMPRESSION: Decreasing left effusion following thoracentesis.  No pneumothorax. Electronically Signed   By: Rolm Baptise M.D.   On: 12/08/2016 12:03  Dg Chest 2 View  Result Date: 12/07/2016 CLINICAL DATA:  Dry cough for 1 week. EXAM: CHEST  2 VIEW COMPARISON:  12/01/2016 FINDINGS: The cardiomediastinal silhouette is obscured by a large left pleural effusion. Calcific atherosclerotic disease of the aorta. No evidence of pneumothorax. The left pleural effusion has enlarged when compared to the prior radiograph, occupying approximately 2/3 of the left hemithorax. Osteoarthritic changes of the spine and kyphotic deformity, with mild chronic appearing compression deformity of multiple lower thoracic and upper lumbar vertebral bodies. Soft tissues are grossly normal. IMPRESSION: Enlarged left pleural effusion. The left lung parenchyma is mostly obscured by it. Small right pleural effusion.  Right lung clear. Calcific atherosclerotic disease of the aorta. Electronically Signed   By: Fidela Salisbury M.D.   On: 12/07/2016 12:59   Dg Chest 2 View  Result Date:  12/01/2016 CLINICAL DATA:  Weakness and not eating well.  Possible fever. EXAM: CHEST  2 VIEW COMPARISON:  None. FINDINGS: Lungs are adequately inflated with opacification over the lower 60% of the left thorax likely large effusion with associated atelectasis. Cannot exclude infection over the left mid to lower lung. Mild cardiomegaly. Minimal calcification over the thoracic aorta. Degenerative change of the spine. IMPRESSION: Moderate opacification over the left mid to lower lung likely large effusion with atelectasis. Cannot exclude infection in the left mid to lower lung. Recommend follow-up to resolution. Cardiomegaly. Electronically Signed   By: Marin Olp M.D.   On: 12/01/2016 13:16   Ct Angio Chest Pe W/cm &/or Wo Cm  Result Date: 12/07/2016 CLINICAL DATA:  Weakness.  Pleural effusions. EXAM: CT ANGIOGRAPHY CHEST WITH CONTRAST TECHNIQUE: Multidetector CT imaging of the chest was performed using the standard protocol during bolus administration of intravenous contrast. Multiplanar CT image reconstructions and MIPs were obtained to evaluate the vascular anatomy. CONTRAST:  100 cc Isovue 370 intravenously. COMPARISON:  Chest radiograph 12/07/2016 FINDINGS: Cardiovascular: Satisfactory opacification of the pulmonary arteries to the segmental level. No evidence of pulmonary embolism. Normal heart size. No pericardial effusion. Aortic atherosclerosis. Mediastinum/Nodes: No enlarged mediastinal, hilar, or axillary lymph nodes. The trachea, and esophagus demonstrate no significant findings. Heterogeneous appearance of the thyroid gland. Lungs/Pleura: The bronchial tree to the level of the lobar branches is intact. There is large left and small right pleural effusion. Evaluation of the lung parenchyma as suboptimal due to motion artifact, but no pulmonary masses are seen within the aerated lung. There is significant compressive atelectasis of the left lung, secondary to the large pleural effusion with complete  collapse of the left lower lobe, and partial collapse of the lingula. There is interstitial pulmonary edema. Upper Abdomen: Small volume abdominal ascites. Musculoskeletal: No chest wall abnormality. No acute or significant osseous findings. Review of the MIP images confirms the above findings. IMPRESSION: No evidence of pulmonary embolus. Large left and small right pleural effusions. Complete collapse of the left lower lobe, partial collapse of the lingula and milder atelectasis of the left upper lobe, likely due to compressive atelectasis. The collapsed lung parenchyma is poorly evaluated, but no obvious pulmonary masses are seen. Interstitial pulmonary edema. Small volume abdominal ascites. Heterogeneous appearance of the thyroid gland, with right sided nodules. Aortic Atherosclerosis (ICD10-I70.0). Electronically Signed   By: Fidela Salisbury M.D.   On: 12/07/2016 16:40   Dg Chest Port 1 View  Result Date: 12/08/2016 CLINICAL DATA:  Left pleural effusion EXAM: PORTABLE CHEST 1 VIEW COMPARISON:  12/07/2016 FINDINGS: Large left pleural effusion with left lower lobe atelectasis, unchanged. Probable cardiomegaly.  Right lung is clear. No change since prior study. IMPRESSION: Stable large left pleural effusion with left lung atelectasis. Electronically Signed   By: Rolm Baptise M.D.   On: 12/08/2016 11:10    Time Spent in minutes  25   Louellen Molder M.D on 12/09/2016 at 10:19 AM  Between 7am to 7pm - Pager - [phone]  After 7pm go to [email or web address] - password [facility]  [facility] -  Office  [phone]

## 2016-12-09 NOTE — Progress Notes (Signed)
Patient was stable throughout the shift, just ready to go home.

## 2016-12-09 NOTE — Evaluation (Signed)
Physical Therapy Evaluation Patient Details Name: Denise Bonilla MRN: [id number] DOB: 1930-05-15 Today's Date: 12/09/2016   History of Present Illness  Pt is a 83 yo female admitted with c/o of not feeling well, with decreased appetite and UTI, dx with pleural effusion s/p thorocentisis to remove 1.6L of fluid. PMH significant for UTI, HTN, and dementia. Dementia has been exacerbated by hospital stay.    Clinical Impression  Pt admitted with above diagnosis. Pt currently with functional limitations due to the deficits listed below (see PT Problem List). Pt is minA for transfers and min guard for ambulation of 150 feet with RW. Pt educated on need to use RW at d/c for safety given her generalized weakness until she is feeling better. Pt and daughter agree that using the RW would be a good idea. Pt will benefit from skilled PT to increase their independence and safety with mobility to allow discharge to the venue listed below.       Follow Up Recommendations No PT follow up;Supervision/Assistance - 24 hour    Equipment Recommendations  None recommended by PT       Precautions / Restrictions Precautions Precautions: Fall Restrictions Weight Bearing Restrictions: No      Mobility  Bed Mobility               General bed mobility comments: on BSC at entry  Transfers Overall transfer level: Needs assistance Equipment used: None;Rolling walker (2 wheeled) Transfers: Sit to/from Stand Sit to Stand: Min assist         General transfer comment: minA for safety with power up vc for hand placement to push off from bed when using RW   Ambulation/Gait Ambulation/Gait assistance: Min guard Ambulation Distance (Feet): 150 Feet Assistive device: Rolling walker (2 wheeled) Gait Pattern/deviations: Step-through pattern;Decreased step length - right;Decreased step length - left;Trunk flexed Gait velocity: slowed Gait velocity interpretation: Below normal speed for age/gender General Gait  Details: min guard for safety,  slow, steady gait with decreased step length, vc for upright posture and anterior pelvic tilt as well as staying within the walker with ambulation        Balance Overall balance assessment: Needs assistance Sitting-balance support: Feet supported;No upper extremity supported Sitting balance-Leahy Scale: Good     Standing balance support: No upper extremity supported Standing balance-Leahy Scale: Fair Standing balance comment: able to stand unsupported while staying within BoS                             Pertinent Vitals/Pain Pain Assessment: No/denies pain  VSS    Home Living Family/patient expects to be discharged to:: Private residence Living Arrangements: Children Available Help at Discharge: Family;Available 24 hours/day Type of Home: House Home Access: Stairs to enter Entrance Stairs-Rails: Right Entrance Stairs-Number of Steps: 3 Home Layout: Two level;Able to live on main level with bedroom/bathroom Home Equipment: Gilford Rile - 2 wheels;Hand held shower head      Prior Function Level of Independence: Independent;Needs assistance   Gait / Transfers Assistance Needed: independent with ambulation around house and stairs  ADL's / Homemaking Assistance Needed: help with iADLs, showers on her own   Comments: community ambulator and occasional driver        Extremity/Trunk Assessment   Upper Extremity Assessment Upper Extremity Assessment: Generalized weakness    Lower Extremity Assessment Lower Extremity Assessment: Generalized weakness    Cervical / Trunk Assessment Cervical / Trunk Assessment: Kyphotic  Communication  Communication: No difficulties  Cognition Arousal/Alertness: Awake/alert Behavior During Therapy: WFL for tasks assessed/performed Overall Cognitive Status: Impaired/Different from baseline Area of Impairment: Memory;Safety/judgement                     Memory: Decreased short-term memory    Safety/Judgement: Decreased awareness of safety;Decreased awareness of deficits            General Comments General comments (skin integrity, edema, etc.): Pt daughter present for session and aided in providing information on prior level of function        Assessment/Plan    PT Assessment Patient needs continued PT services  PT Problem List Decreased activity tolerance;Decreased knowledge of use of DME;Decreased safety awareness       PT Treatment Interventions Gait training;DME instruction;Functional mobility training;Therapeutic activities;Therapeutic exercise;Balance training;Patient/family education    PT Goals (Current goals can be found in the Care Plan section)  Acute Rehab PT Goals Patient Stated Goal: go home PT Goal Formulation: With patient/family Time For Goal Achievement: 12/23/16 Potential to Achieve Goals: Good    Frequency Min 3X/week    AM-PAC PT "6 Clicks" Daily Activity  Outcome Measure Difficulty turning over in bed (including adjusting bedclothes, sheets and blankets)?: A Little Difficulty moving from lying on back to sitting on the side of the bed? : A Little Difficulty sitting down on and standing up from a chair with arms (e.g., wheelchair, bedside commode, etc,.)?: A Little Help needed moving to and from a bed to chair (including a wheelchair)?: None Help needed walking in hospital room?: None Help needed climbing 3-5 steps with a railing? : A Little 6 Click Score: 20    End of Session Equipment Utilized During Treatment: Gait belt Activity Tolerance: Patient tolerated treatment well Patient left: in chair;with call bell/phone within reach;with family/visitor present Nurse Communication: Mobility status PT Visit Diagnosis: Other abnormalities of gait and mobility (R26.89);Muscle weakness (generalized) (M62.81)    Time: 3005-1102 PT Time Calculation (min) (ACUTE ONLY): 34 min   Charges:   PT Evaluation $PT Eval Moderate Complexity: 1  Procedure PT Treatments $Gait Training: 8-22 mins   PT G Codes:        Sherlin Sonier B. Migdalia Dk PT, DPT Acute Rehabilitation  [phone] Pager [phone]    [place] 12/09/2016, 9:16 AM

## 2016-12-09 NOTE — Progress Notes (Signed)
Pt and family educated about safety and importance of bed alarm during the night however family refuses bed alarm at this time. Daugter will stay overnight.. Will continue to round on patient.   Witney Huie, RN

## 2016-12-09 NOTE — Consult Note (Signed)
Reason for Consult: Hyponatremia Referring Physician: Clementeen Graham, MD  Denise Bonilla is an 83 y.o. female.  HPI: Denise Bonilla has a PMH significant for dementia, HTN, and hypercholesterolemia who was brought to [facility] on 12/07/16 by her daughters after she had been to [facility] on 12/01/16 with the complaints of not feeling well.  She was found to have an UTI and treated with keflex, however she stopped drinking fluids and they noted swelling in her ankles.  While in the ED, her serum sodium was found to be 123, CXR revealed large left pleural effusion and small right pleural effusion.  CT showed collaps of left lung with large effusion, small ascites.  She was admitted for further evaluation and management.  She was started on IV lasix for presumed volume overload as the source of her hyponatremia.  The trend in sodium levels is seen below.  We were consulted due to persistent hyponatremia.  Uosm at time of admission was appropriately low at 168, urine sodium was 36, serum osmolality was 260 on the following day.  She is 1.2 liters negative since admission and has had poor po intake for some time. Of note, her lisinopril was placed on hold since admission.    Trend in Serum Sodium:  Sodium  Date/Time Value Ref Range Status  12/09/2016 07:06 AM 125 (L) 135 - 145 mmol/L Final  12/08/2016 06:14 PM 120 (L) 135 - 145 mmol/L Final  12/08/2016 02:53 AM 123 (L) 135 - 145 mmol/L Final  12/07/2016 12:10 PM 123 (L) 135 - 145 mmol/L Final  12/01/2016 12:35 PM 130 (L) 135 - 145 mmol/L Final   PMH:   Past Medical History:  Diagnosis Date  . Dementia   . High cholesterol   . Hypertension     PSH:   Past Surgical History:  Procedure Laterality Date  . CYSTECTOMY      Allergies:  Allergies  Allergen Reactions  . Tetanus Toxoids Anaphylaxis  . Codeine Nausea And Vomiting    Medications:   Prior to Admission medications   Medication Sig Start Date End Date Taking? Authorizing Provider  aspirin EC 81 MG tablet  Take 81 mg by mouth daily.   Yes [provider]  bisoprolol (ZEBETA) 10 MG tablet Take 10 mg by mouth daily.   Yes [provider]  cephALEXin (KEFLEX) 500 MG capsule Take 1 capsule (500 mg total) by mouth 4 (four) times daily. 12/01/16  Yes Nat Christen, MD  lisinopril (PRINIVIL,ZESTRIL) 10 MG tablet Take 10 mg by mouth daily.   Yes [provider]  Multiple Vitamins-Minerals (MULTI FOR HER 50+) TABS Take 1 tablet by mouth daily.   Yes [provider]  Omega-3 Fatty Acids (FISH OIL) 500 MG CAPS Take 1 capsule by mouth daily.   Yes [provider]  simvastatin (ZOCOR) 20 MG tablet Take 20 mg by mouth at bedtime.    Yes [provider]    Discontinued Meds:   Medications Discontinued During This Encounter  Medication Reason  . furosemide (LASIX) injection 20 mg   . cephALEXin (KEFLEX) capsule 500 mg   . MULTI FOR HER 50+ TABS 1 tablet   . Fish Oil CAPS 500 mg   . cephALEXin (KEFLEX) capsule 500 mg   . 0.9 %  sodium chloride infusion   . furosemide (LASIX) injection 40 mg   . lisinopril (PRINIVIL,ZESTRIL) tablet 10 mg     Social History:  reports that she has never smoked. She has never used smokeless tobacco.  She reports that she does not drink alcohol or use drugs.  Family History:   Family History  Problem Relation Age of Onset  . Cancer Other   . Hyperlipidemia Other   . Hypertension Other   . Heart failure Mother     Pertinent items are noted in HPI.  Blood pressure (!) 116/57, pulse 65, temperature 97.5 F (36.4 C), temperature source Oral, resp. rate 18, height 4\' 11"  (1.499 m), weight 70.8 kg (156 lb 1.4 oz), SpO2 98 %. General appearance: alert, cooperative and no distress Head: Normocephalic, without obvious abnormality, atraumatic Eyes: negative findings: lids and lashes normal, conjunctivae and sclerae normal and corneas clear Neck: no adenopathy, no carotid bruit, no JVD, supple, symmetrical, trachea midline and  thyroid not enlarged, symmetric, no tenderness/mass/nodules Resp: diminished breath sounds LLL Cardio: regular rate and rhythm, S1, S2 normal, no murmur, click, rub or gallop GI: soft, non-tender; bowel sounds normal; no masses,  no organomegaly Extremities: extremities normal, atraumatic, no cyanosis or edema  Labs: Basic Metabolic Panel:  Recent Labs Lab 12/07/16 1210 12/07/16 1947 12/08/16 0253 12/08/16 1455 12/08/16 1814 12/09/16 0706  NA 123*  --  123*  --  120* 125*  K 4.7  --  3.4*  --  3.8 3.7  CL 92*  --  88*  --  86* 88*  CO2 24  --  26  --  25 27  GLUCOSE 118*  --  91  --  163* 95  BUN 5*  --  5*  --  8 8  CREATININE 0.70  --  0.80  --  0.82 0.75  ALBUMIN  --  3.6  --  3.5  --   --   CALCIUM 9.3  --  9.0  --  8.9 9.1   Liver Function Tests:  Recent Labs Lab 12/07/16 1947 12/08/16 1455  AST  --  22  ALT  --  18  ALKPHOS  --  66  BILITOT  --  0.8  PROT  --  6.3*  ALBUMIN 3.6 3.5   No results for input(s): LIPASE, AMYLASE in the last 168 hours. No results for input(s): AMMONIA in the last 168 hours. CBC:  Recent Labs Lab 12/07/16 1210  WBC 6.5  HGB 13.0  HCT 37.1  MCV 88.8  PLT 344   PT/INR: @labrcntip (inr:5) Cardiac Enzymes:  Recent Labs Lab 12/07/16 1210  TROPONINI <0.03   CBG: No results for input(s): GLUCAP in the last 168 hours.  Iron Studies: No results for input(s): IRON, TIBC, TRANSFERRIN, FERRITIN in the last 168 hours.  Xrays/Other Studies: Dg Chest 1 View  Result Date: 12/08/2016 CLINICAL DATA:  Post thoracentesis EXAM: CHEST 1 VIEW COMPARISON:  12/08/2016 FINDINGS: Moderate-sized left pleural effusion, decreased following thoracentesis. No pneumothorax. Improving aeration in the left lung with continued left lower lobe atelectasis or infiltrate. No confluent opacity on the right. IMPRESSION: Decreasing left effusion following thoracentesis.  No pneumothorax. Electronically Signed   By: Rolm Baptise M.D.   On: 12/08/2016 12:03    Ct Angio Chest Pe W/cm &/or Wo Cm  Result Date: 12/07/2016 CLINICAL DATA:  Weakness.  Pleural effusions. EXAM: CT ANGIOGRAPHY CHEST WITH CONTRAST TECHNIQUE: Multidetector CT imaging of the chest was performed using the standard protocol during bolus administration of intravenous contrast. Multiplanar CT image reconstructions and MIPs were obtained to evaluate the vascular anatomy. CONTRAST:  100 cc Isovue 370 intravenously. COMPARISON:  Chest radiograph 12/07/2016 FINDINGS: Cardiovascular: Satisfactory opacification of the pulmonary arteries to the segmental  level. No evidence of pulmonary embolism. Normal heart size. No pericardial effusion. Aortic atherosclerosis. Mediastinum/Nodes: No enlarged mediastinal, hilar, or axillary lymph nodes. The trachea, and esophagus demonstrate no significant findings. Heterogeneous appearance of the thyroid gland. Lungs/Pleura: The bronchial tree to the level of the lobar branches is intact. There is large left and small right pleural effusion. Evaluation of the lung parenchyma as suboptimal due to motion artifact, but no pulmonary masses are seen within the aerated lung. There is significant compressive atelectasis of the left lung, secondary to the large pleural effusion with complete collapse of the left lower lobe, and partial collapse of the lingula. There is interstitial pulmonary edema. Upper Abdomen: Small volume abdominal ascites. Musculoskeletal: No chest wall abnormality. No acute or significant osseous findings. Review of the MIP images confirms the above findings. IMPRESSION: No evidence of pulmonary embolus. Large left and small right pleural effusions. Complete collapse of the left lower lobe, partial collapse of the lingula and milder atelectasis of the left upper lobe, likely due to compressive atelectasis. The collapsed lung parenchyma is poorly evaluated, but no obvious pulmonary masses are seen. Interstitial pulmonary edema. Small volume abdominal  ascites. Heterogeneous appearance of the thyroid gland, with right sided nodules. Aortic Atherosclerosis (ICD10-I70.0). Electronically Signed   By: Fidela Salisbury M.D.   On: 12/07/2016 16:40   Dg Chest Port 1 View  Result Date: 12/08/2016 CLINICAL DATA:  Left pleural effusion EXAM: PORTABLE CHEST 1 VIEW COMPARISON:  12/07/2016 FINDINGS: Large left pleural effusion with left lower lobe atelectasis, unchanged. Probable cardiomegaly. Right lung is clear. No change since prior study. IMPRESSION: Stable large left pleural effusion with left lung atelectasis. Electronically Signed   By: Rolm Baptise M.D.   On: 12/08/2016 11:10   US Thyroid  Result Date: 12/09/2016 CLINICAL DATA:  Incidental on CT. Thyroid nodules incidentally seen on chest CT. EXAM: THYROID ULTRASOUND TECHNIQUE: Ultrasound examination of the thyroid gland and adjacent soft tissues was performed. COMPARISON:  Chest CT -12/07/2016 FINDINGS: Parenchymal Echotexture: Mildly heterogenous Isthmus: Normal in size measuring 0.2 cm in diameter Right lobe: Normal in size measuring 4.1 x 2.1 x 1.6 cm Left lobe: Normal in size measuring 3.4 x 1.5 x 1.1 cm _________________________________________________________ Estimated total number of nodules >/= 1 cm: 2 Number of spongiform nodules >/=  2 cm not described below (TR1): 0 Number of mixed cystic and solid nodules >/= 1.5 cm not described below (TR2): 0 _________________________________________________________ There is an approximately 1.5 x 0.7 x 1.2 cm anechoic cyst within the superior pole of the right lobe of the thyroid, which correlates with the nodule seen on preceding chest CT, which contains an eccentric echogenic calcification with ring down artifact suggestive of colloid. This nodule does not meet imaging criteria to recommend percutaneous sampling or dedicated follow-up There are several additional smaller punctate (sub 9 mm) cysts, several of which also contain benign colloid, and none of  which meet imaging criteria to recommend percutaneous sampling or dedicated follow-up. There is a punctate (approximately 0.8 x 0.5 x 0.7 cm) anechoic cyst within the superior pole the left lobe of the thyroid as well as a smaller (approximately 0.3 cm) anechoic cyst within the inferior pole the left lobe of the thyroid, both of which contain eccentric echogenic calcification with ring down artifact suggestive of colloid. Neither nodule meets imaging criteria to recommend percutaneous sampling or dedicated follow-up. IMPRESSION: 1. Findings suggestive of multinodular goiter. 2. None of the discretely measured thyroid nodules/cysts, including the approximately 1.5 cm cyst within  the right lobe of the thyroid which correlates with the nodule seen on preceding chest CT, meet imaging criteria to recommend percutaneous sampling or dedicated follow-up. The above is in keeping with the ACR TI-RADS recommendations - J Am Coll Radiol 2017;14:587-595. Electronically Signed   By: Sandi Mariscal M.D.   On: 12/09/2016 12:43     Assessment/Plan: 1.  Hyponatremia- in setting of large left pleural effusion.  Thoracentesis results appears exudative (parapneumonic vs malignant).  Awaiting cultures and cytology.  No lung mass seen on CT and had normal EF on ECHO but did have grad 1 diastolic dysfunction.  Her urine studies are appropriately low Uosm (but not maximally dilllute likely due to her poor nutrition and low protein intake).   1. Will repeat labs and include cortisol and tsh 2. Agree with holding diuretics for now and follow I's/O's as well as sodium levels 3. Likely related to her pulmonary issue as no offending agent has been identified 2. Left pleural effusion- as above, work up underway.  Awaiting cytologies and cultures. 3. Right sided CHF- peripheral edema has improved.  4. Hypokalemia- will also check TSH and cortisol levels 5. HTN- on bisoprolol, lisinopril on hold 6. Hyperlipidemia- on statin   [name] 12/09/2016, 1:27 PM

## 2016-12-10 LAB — BASIC METABOLIC PANEL
ANION GAP: 8 (ref 5–15)
Anion gap: 10 (ref 5–15)
BUN: 9 mg/dL (ref 6–20)
BUN: 10 mg/dL (ref 6–20)
CALCIUM: 8.6 mg/dL — AB (ref 8.9–10.3)
CALCIUM: 9.2 mg/dL (ref 8.9–10.3)
CO2: 27 mmol/L (ref 22–32)
CO2: 26 mmol/L (ref 22–32)
CREATININE: 0.8 mg/dL (ref 0.44–1.00)
CREATININE: 0.8 mg/dL (ref 0.44–1.00)
Chloride: 86 mmol/L — ABNORMAL LOW (ref 101–111)
Chloride: 84 mmol/L — ABNORMAL LOW (ref 101–111)
GFR calc Af Amer: >60 mL/min (ref >60)
GFR calc Af Amer: >60 mL/min (ref >60)
GFR calc non Af Amer: >60 mL/min (ref >60)
GLUCOSE: 99 mg/dL (ref 65–99)
GLUCOSE: 129 mg/dL — AB (ref 65–99)
Potassium: 3.8 mmol/L (ref 3.5–5.1)
Potassium: 4.9 mmol/L (ref 3.5–5.1)
Sodium: 121 mmol/L — ABNORMAL LOW (ref 135–145)
Sodium: 120 mmol/L — ABNORMAL LOW (ref 135–145)

## 2016-12-10 MED ORDER — SODIUM CHLORIDE 0.9 % IV SOLN
INTRAVENOUS | Status: AC
  Administered 2016-12-10 (×2): via INTRAVENOUS

## 2016-12-10 NOTE — Progress Notes (Signed)
Patient ID: Denise Bonilla, female   DOB: 1930-05-03, 83 y.o.   MRN: [id number] S:"I feel great" O:BP (!) 157/53 (BP Location: Left Arm)   Pulse 71   Temp 97.6 F (36.4 C) (Oral)   Resp 18   Ht 4\' 11"  (1.499 m)   Wt 71.3 kg (157 lb 3 oz)   SpO2 92%   BMI 31.75 kg/m   Intake/Output Summary (Last 24 hours) at 12/10/16 1044 Last data filed at 12/10/16 1013  Gross per 24 hour  Intake              920 ml  Output             1500 ml  Net             -580 ml   Intake/Output: I/O last 3 completed shifts: In: 56 [P.O.:600; I.V.:6; IV Piggyback:100] Out: 3200 [Urine:3200]  Intake/Output this shift:  Total I/O In: 510 [P.O.:510] Out: -  Weight change: 0.5 kg (1 lb 1.6 oz) Gen: WD WF in NAD CVS:no rub Resp:decreased BS at left base BWI:OMBTDH RCB:ULAGTXM pretib edema   Recent Labs Lab 12/07/16 1210 12/07/16 1947 12/08/16 0253 12/08/16 1455 12/08/16 1814 12/09/16 0706 12/09/16 1801 12/10/16 0711  NA 123*  --  123*  --  120* 125* 121* 121*  K 4.7  --  3.4*  --  3.8 3.7 4.0 3.8  CL 92*  --  88*  --  86* 88* 85* 86*  CO2 24  --  26  --  25 27 25 27   GLUCOSE 118*  --  91  --  163* 95 138* 99  BUN 5*  --  5*  --  8 8 10 9   CREATININE 0.70  --  0.80  --  0.82 0.75 0.85 0.80  ALBUMIN  --  3.6  --  3.5  --   --   --   --   CALCIUM 9.3  --  9.0  --  8.9 9.1 8.7* 8.6*  AST  --   --   --  22  --   --   --   --   ALT  --   --   --  18  --   --   --   --    Liver Function Tests:  Recent Labs Lab 12/07/16 1947 12/08/16 1455  AST  --  22  ALT  --  18  ALKPHOS  --  66  BILITOT  --  0.8  PROT  --  6.3*  ALBUMIN 3.6 3.5   No results for input(s): LIPASE, AMYLASE in the last 168 hours. No results for input(s): AMMONIA in the last 168 hours. CBC:  Recent Labs Lab 12/07/16 1210  WBC 6.5  HGB 13.0  HCT 37.1  MCV 88.8  PLT 344   Cardiac Enzymes:  Recent Labs Lab 12/07/16 1210  TROPONINI <0.03   CBG: No results for input(s): GLUCAP in the last 168 hours.  Iron  Studies: No results for input(s): IRON, TIBC, TRANSFERRIN, FERRITIN in the last 72 hours. Studies/Results: Dg Chest 1 View  Result Date: 12/08/2016 CLINICAL DATA:  Post thoracentesis EXAM: CHEST 1 VIEW COMPARISON:  12/08/2016 FINDINGS: Moderate-sized left pleural effusion, decreased following thoracentesis. No pneumothorax. Improving aeration in the left lung with continued left lower lobe atelectasis or infiltrate. No confluent opacity on the right. IMPRESSION: Decreasing left effusion following thoracentesis.  No pneumothorax. Electronically Signed   By: Rolm Baptise M.D.   On:  12/08/2016 12:03   US Thyroid  Result Date: 12/09/2016 CLINICAL DATA:  Incidental on CT. Thyroid nodules incidentally seen on chest CT. EXAM: THYROID ULTRASOUND TECHNIQUE: Ultrasound examination of the thyroid gland and adjacent soft tissues was performed. COMPARISON:  Chest CT -12/07/2016 FINDINGS: Parenchymal Echotexture: Mildly heterogenous Isthmus: Normal in size measuring 0.2 cm in diameter Right lobe: Normal in size measuring 4.1 x 2.1 x 1.6 cm Left lobe: Normal in size measuring 3.4 x 1.5 x 1.1 cm _________________________________________________________ Estimated total number of nodules >/= 1 cm: 2 Number of spongiform nodules >/=  2 cm not described below (TR1): 0 Number of mixed cystic and solid nodules >/= 1.5 cm not described below (TR2): 0 _________________________________________________________ There is an approximately 1.5 x 0.7 x 1.2 cm anechoic cyst within the superior pole of the right lobe of the thyroid, which correlates with the nodule seen on preceding chest CT, which contains an eccentric echogenic calcification with ring down artifact suggestive of colloid. This nodule does not meet imaging criteria to recommend percutaneous sampling or dedicated follow-up There are several additional smaller punctate (sub 9 mm) cysts, several of which also contain benign colloid, and none of which meet imaging criteria to  recommend percutaneous sampling or dedicated follow-up. There is a punctate (approximately 0.8 x 0.5 x 0.7 cm) anechoic cyst within the superior pole the left lobe of the thyroid as well as a smaller (approximately 0.3 cm) anechoic cyst within the inferior pole the left lobe of the thyroid, both of which contain eccentric echogenic calcification with ring down artifact suggestive of colloid. Neither nodule meets imaging criteria to recommend percutaneous sampling or dedicated follow-up. IMPRESSION: 1. Findings suggestive of multinodular goiter. 2. None of the discretely measured thyroid nodules/cysts, including the approximately 1.5 cm cyst within the right lobe of the thyroid which correlates with the nodule seen on preceding chest CT, meet imaging criteria to recommend percutaneous sampling or dedicated follow-up. The above is in keeping with the ACR TI-RADS recommendations - J Am Coll Radiol 2017;14:587-595. Electronically Signed   By: Sandi Mariscal M.D.   On: 12/09/2016 12:43   US Thoracentesis Asp Pleural Space W/img Guide  Result Date: 12/10/2016 INDICATION: Large left pleural effusion noted on recent imaging. Request made for diagnostic and therapeutic thoracentesis. EXAM: ULTRASOUND GUIDED DIAGNOSTIC AND THERAPEUTIC THORACENTESIS MEDICATIONS: 1% lidocaine COMPLICATIONS: None immediate. PROCEDURE: An ultrasound guided thoracentesis was thoroughly discussed with the patient's daughter and questions answered. The benefits, risks, alternatives and complications were also discussed. The patient's daughter understands and wishes to proceed with the procedure. Written consent was obtained. Ultrasound was performed to localize and mark an adequate pocket of fluid in the left chest. The area was then prepped and draped in the normal sterile fashion. 1% Lidocaine was used for local anesthesia. Under ultrasound guidance a Safe-T-Centesis catheter was introduced. Thoracentesis was performed. The catheter was removed  and a dressing applied. FINDINGS: A total of approximately 1.6 L of slightly dark, serous fluid was removed. Samples were sent to the laboratory as requested by the clinical team. IMPRESSION: Successful ultrasound guided left thoracentesis yielding 1.6 L of pleural fluid. The procedure was terminated early secondary to significant coughing. Read by: Saverio Danker, PA-C Electronically Signed   By: Markus Daft M.D.   On: 12/08/2016 11:47   . aspirin EC  81 mg Oral Daily  . azithromycin  500 mg Oral Daily  . bisoprolol  10 mg Oral Daily  . enoxaparin (LOVENOX) injection  40 mg Subcutaneous Q24H  .  multivitamin with minerals  1 tablet Oral Daily  . omega-3 acid ethyl esters  1 g Oral Daily  . simvastatin  20 mg Oral QHS  . sodium chloride flush  3 mL Intravenous Q12H  . sodium chloride flush  3 mL Intravenous Q12H    BMET    Component Value Date/Time   NA 121 (L) 12/10/2016 0711   K 3.8 12/10/2016 0711   CL 86 (L) 12/10/2016 0711   CO2 27 12/10/2016 0711   GLUCOSE 99 12/10/2016 0711   BUN 9 12/10/2016 0711   CREATININE 0.80 12/10/2016 0711   CALCIUM 8.6 (L) 12/10/2016 0711   GFRNONAA >60 12/10/2016 0711   GFRAA >60 12/10/2016 0711   CBC    Component Value Date/Time   WBC 6.5 12/07/2016 1210   RBC 4.18 12/07/2016 1210   HGB 13.0 12/07/2016 1210   HCT 37.1 12/07/2016 1210   PLT 344 12/07/2016 1210   MCV 88.8 12/07/2016 1210   MCH 31.1 12/07/2016 1210   MCHC 35.0 12/07/2016 1210   RDW 12.6 12/07/2016 1210   LYMPHSABS 1.3 12/01/2016 1235   MONOABS 0.6 12/01/2016 1235   EOSABS 0.0 12/01/2016 1235   BASOSABS 0.0 12/01/2016 1235     Assessment/Plan: 1.  Hyponatremia- in setting of large left pleural effusion.  Thoracentesis results appears exudative (parapneumonic vs malignant).  Awaiting cultures and cytology.  No lung mass seen on CT and had normal EF on ECHO but did have grad 1 diastolic dysfunction.  Her urine studies are appropriately low Uosm (but not maximally dilllute  likely due to her poor nutrition and low protein intake).   1. Repeat urine Na consistent with volume depletion (<10).  Will hold diuretics and start IV NS and follow.   2. follow I's/O's as well as sodium levels 3. Likely related to her pulmonary issue as no offending agent has been identified 2. Left pleural effusion- as above, work up underway.  Awaiting cytologies and cultures but worrisome for exudative effusion.  Cytology pending. 3. Right sided CHF- peripheral edema has improved.  4. Hypokalemia- will also check TSH and cortisol levels 5. HTN- on bisoprolol, lisinopril on hold 6. Hyperlipidemia- on statin  [name], MD [facility] [phone]

## 2016-12-10 NOTE — Progress Notes (Signed)
Initial Nutrition Assessment  DOCUMENTATION CODES:   Not applicable  INTERVENTION:  1. Ensure Enlive po BID, each supplement provides 350 kcal and 20 grams of protein  NUTRITION DIAGNOSIS:   Inadequate oral intake related to chronic illness (Dementia) as evidenced by per patient/family report.  GOAL:   Patient will meet greater than or equal to 90% of their needs  MONITOR:   PO intake, I & O's, Labs, Weight trends, Supplement acceptance  REASON FOR ASSESSMENT:   Consult Assessment of nutrition requirement/status  ASSESSMENT:   83 year old female with dementia, hypertension, hyperlipidemia brought to the ED by her daughter as she has been feeling weak for past 1 month  Patient with previous visits to PCP and [facility]. Recent UTI.  Moderate-Severe dementai Poor Appetite x1 month. Only 3# insignificant wt loss so far. Usual weight is ~160# Weight loss may be masked by edema, legs mildly swollen, non-pitting. RUE extrema mild w/ mild edema as well. PO intake has been poor in hospital, tray at bedside virtually un-touched. Daughter states patient will drink ensure with much encouragement.  Encouraged pt to increase PO intake, protein intake to maintain weight and strength. Daughter will encourage her to consume ensure. Nutrition-Focused physical exam completed. Findings are mild fat depletion, no  muscle depletion, and mild-moderate edema.  Labs and medications reviewed: Na 121 MVI w/ Minerals, NS @ 53mL/hr  Diet Order:  Diet regular Room service appropriate? Yes; Fluid consistency: Thin  Skin:  Wound (see comment) (Ecchymosis to arm, buttocks)  Last BM:  12/09/2016  Height:   Ht Readings from Last 1 Encounters:  12/07/16 4\' 11"  (1.499 m)    Weight:   Wt Readings from Last 1 Encounters:  12/10/16 157 lb 3 oz (71.3 kg)    Ideal Body Weight:  43.18 kg  BMI:  Body mass index is 31.75 kg/m.  Estimated Nutritional Needs:   Kcal:  1450-1750 calories  Protein:   71-86 grams  Fluid:  1.2L  EDUCATION NEEDS:   Education needs addressed  Satira Anis. Kari Kerth, MS, RD LDN Inpatient Clinical Dietitian Pager [phone]

## 2016-12-10 NOTE — Care Management Important Message (Signed)
Important Message  Patient Details  Name: Denise Bonilla MRN: [id number] Date of Birth: 20-Oct-1929   Medicare Important Message Given:  Yes    Koki Buxton 12/10/2016, 12:54 PM

## 2016-12-10 NOTE — Progress Notes (Addendum)
PROGRESS NOTE                                                                                                                                                                                                             Patient Demographics:    Denise Bonilla, is a 83 y.o. female, DOB - [birth year], MPN:[id number]  Admit date - 12/07/2016   Admitting Physician Debbe Odea, MD  Outpatient Primary MD for the patient is Monico Blitz, MD  LOS - 3  Outpatient Specialists:None  Chief Complaint  Patient presents with  . Weakness       Brief Narrative   83 year old female with dementia, hypertension, hyperlipidemia brought to the ED by her daughter as she has been feeling weak for past 1 month. She also has had very poor appetite. She took patient to her PCP one month back and was given antibiotics for possible UTI. She then took her to [facility] ED on 6/24 since patient was increasingly weak and was wobbly on walking. She was found to have UTI and discharged on Keflex. At that time she had a chest x-ray done which showed moderate left pleural effusion. As the patient was not feeling well the daughter looked up her chest x-ray in my chart and found she had pleural effusion and was brought to the ED. Patient was also found to have leg swelling for past few weeks.  No history of fever, dizziness, loss of consciousness, chest pain, complains of shortness of breath, abdominal pain, diarrhea or dysuria. No new medication besides antibiotics. No history of fall.  In the ED she was hypertensive. Labs showed sodium of 123, chloride of 92, chest x-ray showing large left pleural effusion and small right pleural effusion. CT chest again showed large left and small right pleural effusion with collapse of the left lung, interstitial pulmonary edema and small abdominal ascites. BNP of 131. Admitted to hospitalist service for further management.   Subjective:   Still has very poor appetite. Denies shortness of breath.   Assessment  & Plan :   Principal problem Left-sided pleural effusion Thoracentesis done with 1.6 L. serous fluid removed. Appears exudative. (? Parapneumonic versus malignant) Cultures without any growth so far. Cytology pending. No lung mass seen on CT.  2-D echo with EF of 65-70% and no wall motion abnormality, grade 1 diastolic  dysfunction.  Discontinued IV Lasix .  ? Lobar pneumonia with parapneumonic effusion. -Exudative effusion noted. Treating empirically with Rocephin and azithromycin. Follow fluid for culture.  Hyponatremia Possibly hypovolemic given poor by mouth intake. Has low serum and urine osmolality with normal urine sodium initially but repeat showed low urine sodium and low osmolality. Serum sodium did not improve with IV Lasix and fluid restriction. Started on gentle hydration with normal saline@ 56ml/ h for discussing with nephrology today. Monitor labs later today.  Hypokalemia Replenished. Normal cortisol.  Essential hypertension Continue bisoprolol. Lisinopril held.  Hyperlipidemia Continue statin.   Dementia, moderate to severe  Recent UTI. Was treated with Keflex.  Thyroid nodule Daughter reports patient having hot flashes. TSH normal 1 free T4 is mildly elevated (1.7). Right-sided nodule seen on CT angiogram of the chest. Thyroid ultrasound shows multinodular goiter. If pleural fluid cytology shows malignant cells then she would need CT of the abdomen and pelvis to look for primary source and also possibly need a thyroid nodule biopsy.  Poor oral intake Dilatation consulted. Patient encouraged to eat more.   Code Status : Full code  Family Communication  : Daughter at bedside  Disposition Plan  : Home pending clinical improvement  Barriers For Discharge : Active symptoms  Consults  : Nephrology  Procedures  :  CT angiogram of the chest Left thoracentesis 2-D  echo  DVT Prophylaxis  :  Lovenox -   Lab Results  Component Value Date   PLT 344 12/07/2016    Antibiotics  :    Anti-infectives    Start     Dose/Rate Route Frequency Ordered Stop   12/08/16 2000  cefTRIAXone (ROCEPHIN) 1 g in dextrose 5 % 50 mL IVPB     1 g 100 mL/hr over 30 Minutes Intravenous Every 24 hours 12/08/16 1902     12/08/16 1915  azithromycin (ZITHROMAX) tablet 500 mg     500 mg Oral Daily 12/08/16 1902     12/07/16 1815  cephALEXin (KEFLEX) capsule 500 mg  Status:  Discontinued     500 mg Oral 4 times daily 12/07/16 1801 12/08/16 1901   12/07/16 1800  cephALEXin (KEFLEX) capsule 500 mg  Status:  Discontinued     500 mg Oral 4 times daily 12/07/16 1752 12/07/16 1801        Objective:   Vitals:   12/09/16 1233 12/09/16 1935 12/10/16 0447 12/10/16 0837  BP: (!) 116/57 (!) 103/46 (!) 108/51 (!) 157/53  Pulse: 65 68 65 71  Resp: 18 18 18 18   Temp: 97.5 F (36.4 C) 97.6 F (36.4 C) 97.9 F (36.6 C) 97.6 F (36.4 C)  TempSrc: Oral Oral Oral Oral  SpO2: 98% 96% 97% 92%  Weight:   71.3 kg (157 lb 3 oz)   Height:        Wt Readings from Last 3 Encounters:  12/10/16 71.3 kg (157 lb 3 oz)  12/01/16 72.6 kg (160 lb)     Intake/Output Summary (Last 24 hours) at 12/10/16 1151 Last data filed at 12/10/16 1013  Gross per 24 hour  Intake              920 ml  Output             1500 ml  Net             -580 ml     Physical Exam Gen.: Elderly female not in distress HEENT: Moist mucosa, supple neck Chest: Improved breath sounds  over left lung, no rhonchi or wheeze CVS: Normal S1 and S2, no murmurs  GI: Soft, nondistended, nontender Musculoskeletal: Warm, trace pitting edema bilaterally   Labs  CBC  Recent Labs Lab 12/07/16 1210  WBC 6.5  HGB 13.0  HCT 37.1  PLT 344  MCV 88.8  MCH 31.1  MCHC 35.0  RDW 12.6    Chemistries   Recent Labs Lab 12/08/16 0253 12/08/16 1455 12/08/16 1814 12/09/16 0706 12/09/16 1801 12/10/16 0711  NA 123*   --  120* 125* 121* 121*  K 3.4*  --  3.8 3.7 4.0 3.8  CL 88*  --  86* 88* 85* 86*  CO2 26  --  25 27 25 27   GLUCOSE 91  --  163* 95 138* 99  BUN 5*  --  8 8 10 9   CREATININE 0.80  --  0.82 0.75 0.85 0.80  CALCIUM 9.0  --  8.9 9.1 8.7* 8.6*  AST  --  22  --   --   --   --   ALT  --  18  --   --   --   --   ALKPHOS  --  66  --   --   --   --   BILITOT  --  0.8  --   --   --   --    ------------------------------------------------------------------------------------------------------------------ No results for input(s): CHOL, HDL, LDLCALC, TRIG, CHOLHDL, LDLDIRECT in the last 72 hours.  No results found for: HGBA1C ------------------------------------------------------------------------------------------------------------------  Recent Labs  12/08/16 0253  TSH 2.523   ------------------------------------------------------------------------------------------------------------------ No results for input(s): VITAMINB12, FOLATE, FERRITIN, TIBC, IRON, RETICCTPCT in the last 72 hours.  Coagulation profile No results for input(s): INR, PROTIME in the last 168 hours.  No results for input(s): DDIMER in the last 72 hours.  Cardiac Enzymes  Recent Labs Lab 12/07/16 1210  TROPONINI <0.03   ------------------------------------------------------------------------------------------------------------------    Component Value Date/Time   BNP 131.2 (H) 12/07/2016 1352    Inpatient Medications  Scheduled Meds: . aspirin EC  81 mg Oral Daily  . azithromycin  500 mg Oral Daily  . bisoprolol  10 mg Oral Daily  . enoxaparin (LOVENOX) injection  40 mg Subcutaneous Q24H  . multivitamin with minerals  1 tablet Oral Daily  . omega-3 acid ethyl esters  1 g Oral Daily  . simvastatin  20 mg Oral QHS  . sodium chloride flush  3 mL Intravenous Q12H  . sodium chloride flush  3 mL Intravenous Q12H   Continuous Infusions: . sodium chloride    . sodium chloride    . cefTRIAXone (ROCEPHIN)  IV  Stopped (12/09/16 2022)   PRN Meds:.sodium chloride, acetaminophen **OR** acetaminophen, LORazepam, menthol-cetylpyridinium, ondansetron **OR** ondansetron (ZOFRAN) IV, sodium chloride flush  Micro Results Recent Results (from the past 240 hour(s))  Urine culture     Status: Abnormal   Collection Time: 12/01/16 12:35 PM  Result Value Ref Range Status   Specimen Description URINE, CLEAN CATCH  Final   Special Requests NONE  Final   Culture MULTIPLE SPECIES PRESENT, SUGGEST RECOLLECTION (A)  Final   Report Status 12/03/2016 FINAL  Final  Culture, body fluid-bottle     Status: None (Preliminary result)   Collection Time: 12/08/16 11:32 AM  Result Value Ref Range Status   Specimen Description PLEURAL LEFT  Final   Special Requests NONE  Final   Culture NO GROWTH 1 DAY  Final   Report Status PENDING  Incomplete  Gram stain  Status: None   Collection Time: 12/08/16 11:32 AM  Result Value Ref Range Status   Specimen Description PLEURAL LEFT  Final   Special Requests NONE  Final   Gram Stain NO WBC SEEN NO ORGANISMS SEEN   Final   Report Status 12/08/2016 FINAL  Final    Radiology Reports Dg Chest 1 View  Result Date: 12/08/2016 CLINICAL DATA:  Post thoracentesis EXAM: CHEST 1 VIEW COMPARISON:  12/08/2016 FINDINGS: Moderate-sized left pleural effusion, decreased following thoracentesis. No pneumothorax. Improving aeration in the left lung with continued left lower lobe atelectasis or infiltrate. No confluent opacity on the right. IMPRESSION: Decreasing left effusion following thoracentesis.  No pneumothorax. Electronically Signed   By: Rolm Baptise M.D.   On: 12/08/2016 12:03   Dg Chest 2 View  Result Date: 12/07/2016 CLINICAL DATA:  Dry cough for 1 week. EXAM: CHEST  2 VIEW COMPARISON:  12/01/2016 FINDINGS: The cardiomediastinal silhouette is obscured by a large left pleural effusion. Calcific atherosclerotic disease of the aorta. No evidence of pneumothorax. The left pleural  effusion has enlarged when compared to the prior radiograph, occupying approximately 2/3 of the left hemithorax. Osteoarthritic changes of the spine and kyphotic deformity, with mild chronic appearing compression deformity of multiple lower thoracic and upper lumbar vertebral bodies. Soft tissues are grossly normal. IMPRESSION: Enlarged left pleural effusion. The left lung parenchyma is mostly obscured by it. Small right pleural effusion.  Right lung clear. Calcific atherosclerotic disease of the aorta. Electronically Signed   By: Fidela Salisbury M.D.   On: 12/07/2016 12:59   Dg Chest 2 View  Result Date: 12/01/2016 CLINICAL DATA:  Weakness and not eating well.  Possible fever. EXAM: CHEST  2 VIEW COMPARISON:  None. FINDINGS: Lungs are adequately inflated with opacification over the lower 60% of the left thorax likely large effusion with associated atelectasis. Cannot exclude infection over the left mid to lower lung. Mild cardiomegaly. Minimal calcification over the thoracic aorta. Degenerative change of the spine. IMPRESSION: Moderate opacification over the left mid to lower lung likely large effusion with atelectasis. Cannot exclude infection in the left mid to lower lung. Recommend follow-up to resolution. Cardiomegaly. Electronically Signed   By: Marin Olp M.D.   On: 12/01/2016 13:16   Ct Angio Chest Pe W/cm &/or Wo Cm  Result Date: 12/07/2016 CLINICAL DATA:  Weakness.  Pleural effusions. EXAM: CT ANGIOGRAPHY CHEST WITH CONTRAST TECHNIQUE: Multidetector CT imaging of the chest was performed using the standard protocol during bolus administration of intravenous contrast. Multiplanar CT image reconstructions and MIPs were obtained to evaluate the vascular anatomy. CONTRAST:  100 cc Isovue 370 intravenously. COMPARISON:  Chest radiograph 12/07/2016 FINDINGS: Cardiovascular: Satisfactory opacification of the pulmonary arteries to the segmental level. No evidence of pulmonary embolism. Normal heart  size. No pericardial effusion. Aortic atherosclerosis. Mediastinum/Nodes: No enlarged mediastinal, hilar, or axillary lymph nodes. The trachea, and esophagus demonstrate no significant findings. Heterogeneous appearance of the thyroid gland. Lungs/Pleura: The bronchial tree to the level of the lobar branches is intact. There is large left and small right pleural effusion. Evaluation of the lung parenchyma as suboptimal due to motion artifact, but no pulmonary masses are seen within the aerated lung. There is significant compressive atelectasis of the left lung, secondary to the large pleural effusion with complete collapse of the left lower lobe, and partial collapse of the lingula. There is interstitial pulmonary edema. Upper Abdomen: Small volume abdominal ascites. Musculoskeletal: No chest wall abnormality. No acute or significant osseous findings. Review of  the MIP images confirms the above findings. IMPRESSION: No evidence of pulmonary embolus. Large left and small right pleural effusions. Complete collapse of the left lower lobe, partial collapse of the lingula and milder atelectasis of the left upper lobe, likely due to compressive atelectasis. The collapsed lung parenchyma is poorly evaluated, but no obvious pulmonary masses are seen. Interstitial pulmonary edema. Small volume abdominal ascites. Heterogeneous appearance of the thyroid gland, with right sided nodules. Aortic Atherosclerosis (ICD10-I70.0). Electronically Signed   By: Fidela Salisbury M.D.   On: 12/07/2016 16:40   Dg Chest Port 1 View  Result Date: 12/08/2016 CLINICAL DATA:  Left pleural effusion EXAM: PORTABLE CHEST 1 VIEW COMPARISON:  12/07/2016 FINDINGS: Large left pleural effusion with left lower lobe atelectasis, unchanged. Probable cardiomegaly. Right lung is clear. No change since prior study. IMPRESSION: Stable large left pleural effusion with left lung atelectasis. Electronically Signed   By: Rolm Baptise M.D.   On: 12/08/2016  11:10   US Thyroid  Result Date: 12/09/2016 CLINICAL DATA:  Incidental on CT. Thyroid nodules incidentally seen on chest CT. EXAM: THYROID ULTRASOUND TECHNIQUE: Ultrasound examination of the thyroid gland and adjacent soft tissues was performed. COMPARISON:  Chest CT -12/07/2016 FINDINGS: Parenchymal Echotexture: Mildly heterogenous Isthmus: Normal in size measuring 0.2 cm in diameter Right lobe: Normal in size measuring 4.1 x 2.1 x 1.6 cm Left lobe: Normal in size measuring 3.4 x 1.5 x 1.1 cm _________________________________________________________ Estimated total number of nodules >/= 1 cm: 2 Number of spongiform nodules >/=  2 cm not described below (TR1): 0 Number of mixed cystic and solid nodules >/= 1.5 cm not described below (TR2): 0 _________________________________________________________ There is an approximately 1.5 x 0.7 x 1.2 cm anechoic cyst within the superior pole of the right lobe of the thyroid, which correlates with the nodule seen on preceding chest CT, which contains an eccentric echogenic calcification with ring down artifact suggestive of colloid. This nodule does not meet imaging criteria to recommend percutaneous sampling or dedicated follow-up There are several additional smaller punctate (sub 9 mm) cysts, several of which also contain benign colloid, and none of which meet imaging criteria to recommend percutaneous sampling or dedicated follow-up. There is a punctate (approximately 0.8 x 0.5 x 0.7 cm) anechoic cyst within the superior pole the left lobe of the thyroid as well as a smaller (approximately 0.3 cm) anechoic cyst within the inferior pole the left lobe of the thyroid, both of which contain eccentric echogenic calcification with ring down artifact suggestive of colloid. Neither nodule meets imaging criteria to recommend percutaneous sampling or dedicated follow-up. IMPRESSION: 1. Findings suggestive of multinodular goiter. 2. None of the discretely measured thyroid  nodules/cysts, including the approximately 1.5 cm cyst within the right lobe of the thyroid which correlates with the nodule seen on preceding chest CT, meet imaging criteria to recommend percutaneous sampling or dedicated follow-up. The above is in keeping with the ACR TI-RADS recommendations - J Am Coll Radiol 2017;14:587-595. Electronically Signed   By: Sandi Mariscal M.D.   On: 12/09/2016 12:43   US Thoracentesis Asp Pleural Space W/img Guide  Result Date: 12/10/2016 INDICATION: Large left pleural effusion noted on recent imaging. Request made for diagnostic and therapeutic thoracentesis. EXAM: ULTRASOUND GUIDED DIAGNOSTIC AND THERAPEUTIC THORACENTESIS MEDICATIONS: 1% lidocaine COMPLICATIONS: None immediate. PROCEDURE: An ultrasound guided thoracentesis was thoroughly discussed with the patient's daughter and questions answered. The benefits, risks, alternatives and complications were also discussed. The patient's daughter understands and wishes to proceed with the procedure.  Written consent was obtained. Ultrasound was performed to localize and mark an adequate pocket of fluid in the left chest. The area was then prepped and draped in the normal sterile fashion. 1% Lidocaine was used for local anesthesia. Under ultrasound guidance a Safe-T-Centesis catheter was introduced. Thoracentesis was performed. The catheter was removed and a dressing applied. FINDINGS: A total of approximately 1.6 L of slightly dark, serous fluid was removed. Samples were sent to the laboratory as requested by the clinical team. IMPRESSION: Successful ultrasound guided left thoracentesis yielding 1.6 L of pleural fluid. The procedure was terminated early secondary to significant coughing. Read by: Saverio Danker, PA-C Electronically Signed   By: Markus Daft M.D.   On: 12/08/2016 11:47    Time Spent in minutes  25   Louellen Molder M.D on 12/10/2016 at 11:51 AM  Between 7am to 7pm - Pager - [phone]  After 7pm go to  [email or web address] - password [facility]  [facility] -  Office  [phone]

## 2016-12-11 DIAGNOSIS — E871 Hypo-osmolality and hyponatremia: Secondary | ICD-10-CM

## 2016-12-11 DIAGNOSIS — E041 Nontoxic single thyroid nodule: Secondary | ICD-10-CM

## 2016-12-11 DIAGNOSIS — R531 Weakness: Secondary | ICD-10-CM

## 2016-12-11 DIAGNOSIS — Z9889 Other specified postprocedural states: Secondary | ICD-10-CM

## 2016-12-11 DIAGNOSIS — I10 Essential (primary) hypertension: Secondary | ICD-10-CM

## 2016-12-11 DIAGNOSIS — J90 Pleural effusion, not elsewhere classified: Secondary | ICD-10-CM

## 2016-12-11 DIAGNOSIS — E042 Nontoxic multinodular goiter: Secondary | ICD-10-CM

## 2016-12-11 DIAGNOSIS — I509 Heart failure, unspecified: Secondary | ICD-10-CM

## 2016-12-11 LAB — RENAL FUNCTION PANEL
ANION GAP: 7 (ref 5–15)
Albumin: 2.7 g/dL — ABNORMAL LOW (ref 3.5–5.0)
BUN: 8 mg/dL (ref 6–20)
CHLORIDE: 90 mmol/L — AB (ref 101–111)
CO2: 27 mmol/L (ref 22–32)
Calcium: 8.9 mg/dL (ref 8.9–10.3)
Creatinine, Ser: 0.65 mg/dL (ref 0.44–1.00)
GFR calc non Af Amer: >60 mL/min (ref >60)
Glucose, Bld: 94 mg/dL (ref 65–99)
POTASSIUM: 3.7 mmol/L (ref 3.5–5.1)
Phosphorus: 3 mg/dL (ref 2.5–4.6)
Sodium: 124 mmol/L — ABNORMAL LOW (ref 135–145)

## 2016-12-11 MED ORDER — SODIUM CHLORIDE 0.9 % IV SOLN
INTRAVENOUS | Status: DC
  Administered 2016-12-11: 17:00:00 via INTRAVENOUS
  Administered 2016-12-12: 75 mL/h via INTRAVENOUS

## 2016-12-11 NOTE — Progress Notes (Signed)
PROGRESS NOTE    Denise Bonilla  GDJ:[id number] DOB: [birth year] DOA: 12/07/2016 PCP: Monico Blitz, MD    Brief Narrative: 83 year old lady  Admitted for left pleural effusion and hyponatremia.   Assessment & Plan:   Principal Problem:   Pleural effusion due to CHF (congestive heart failure) (HCC) Active Problems:   Hyponatremia   Ascites   HTN (hypertension)   Multiple thyroid nodules   Large left pleural effusion: s/p thoracentesis, appears exudative. Possibly para pneumonic effusion.  Negative cultures.  Cytology pending.  CT chest, no lung mass.  2 d echocardiogram with grade 1 diastolic dysfunction.    Para pneumonic effusion: Treating the effusion with IV rocephin and zithromax.  Negative cultures so far.    Hyponatremia:  Volume depleted.  Gentle hydration with NS, slight improvement of sodium.  Renal consulted and recommendations given.    Hypertension:  Controlled.   Dementia:  Moderate, no agitation.    Thyroid nodule: US showed multinodular goiter.  Possibly need biopsy after we get the cytology of the sputum.      DVT prophylaxis: lovenox.  Code Status: (Full) Family Communication: family at bedside.  Disposition Plan: home in 1 to 2 days.    Consultants:   Nephrology.    Procedures: none.    Antimicrobials: rocephin and zithromax from 7/1   Subjective: Reports feeling  Better, no chest pain or sob.  No nausea, or vomiting.  No abdominal pain.   Objective: Vitals:   12/10/16 2026 12/11/16 0033 12/11/16 0349 12/11/16 1204  BP: (!) 132/47 120/60 (!) 124/46 (!) 129/57  Pulse: 66 62 64 (!) 58  Resp: 18 18 18 18   Temp: 97.8 F (36.6 C) 97.9 F (36.6 C) 97.9 F (36.6 C) 98.3 F (36.8 C)  TempSrc: Oral Oral Oral Oral  SpO2: 95% 95% 93% 97%  Weight:   71.3 kg (157 lb 3.2 oz)   Height:        Intake/Output Summary (Last 24 hours) at 12/11/16 1922 Last data filed at 12/11/16 1719  Gross per 24 hour  Intake          1903.75 ml   Output             2775 ml  Net          -871.25 ml   Filed Weights   12/09/16 0622 12/10/16 0447 12/11/16 0349  Weight: 70.8 kg (156 lb 1.4 oz) 71.3 kg (157 lb 3 oz) 71.3 kg (157 lb 3.2 oz)    Examination:  General exam: Appears calm and comfortable  Respiratory system: Clear to auscultation. Respiratory effort normal. Cardiovascular system: S1 & S2 heard, RRR. No JVD, murmurs, rubs, gallops or clicks. No pedal edema. Gastrointestinal system: Abdomen is nondistended, soft and nontender. No organomegaly or masses felt. Normal bowel sounds heard. Central nervous system: Alert and oriented. No focal neurological deficits. Extremities: Symmetric 5 x 5 power. Skin: No rashes, lesions or ulcers Psychiatry:  Mood & affect appropriate.     Data Reviewed: I have personally reviewed following labs and imaging studies  CBC:  Recent Labs Lab 12/07/16 1210  WBC 6.5  HGB 13.0  HCT 37.1  MCV 88.8  PLT 622   Basic Metabolic Panel:  Recent Labs Lab 12/09/16 0706 12/09/16 1801 12/10/16 0711 12/10/16 1832 12/11/16 0326  NA 125* 121* 121* 120* 124*  K 3.7 4.0 3.8 4.9 3.7  CL 88* 85* 86* 84* 90*  CO2 27 25 27 26 27   GLUCOSE 95 138*  99 129* 94  BUN 8 10 9 10 8   CREATININE 0.75 0.85 0.80 0.80 0.65  CALCIUM 9.1 8.7* 8.6* 9.2 8.9  PHOS  --   --   --   --  3.0   GFR: Estimated Creatinine Clearance: 44.2 mL/min (by C-G formula based on SCr of 0.65 mg/dL). Liver Function Tests:  Recent Labs Lab 12/07/16 1947 12/08/16 1455 12/11/16 0326  AST  --  22  --   ALT  --  18  --   ALKPHOS  --  66  --   BILITOT  --  0.8  --   PROT  --  6.3*  --   ALBUMIN 3.6 3.5 2.7*   No results for input(s): LIPASE, AMYLASE in the last 168 hours. No results for input(s): AMMONIA in the last 168 hours. Coagulation Profile: No results for input(s): INR, PROTIME in the last 168 hours. Cardiac Enzymes:  Recent Labs Lab 12/07/16 1210  TROPONINI <0.03   BNP (last 3 results) No results for  input(s): PROBNP in the last 8760 hours. HbA1C: No results for input(s): HGBA1C in the last 72 hours. CBG: No results for input(s): GLUCAP in the last 168 hours. Lipid Profile: No results for input(s): CHOL, HDL, LDLCALC, TRIG, CHOLHDL, LDLDIRECT in the last 72 hours. Thyroid Function Tests: No results for input(s): TSH, T4TOTAL, FREET4, T3FREE, THYROIDAB in the last 72 hours. Anemia Panel: No results for input(s): VITAMINB12, FOLATE, FERRITIN, TIBC, IRON, RETICCTPCT in the last 72 hours. Sepsis Labs: No results for input(s): PROCALCITON, LATICACIDVEN in the last 168 hours.  Recent Results (from the past 240 hour(s))  Culture, body fluid-bottle     Status: None (Preliminary result)   Collection Time: 12/08/16 11:32 AM  Result Value Ref Range Status   Specimen Description PLEURAL LEFT  Final   Special Requests NONE  Final   Culture NO GROWTH 3 DAYS  Final   Report Status PENDING  Incomplete  Gram stain     Status: None   Collection Time: 12/08/16 11:32 AM  Result Value Ref Range Status   Specimen Description PLEURAL LEFT  Final   Special Requests NONE  Final   Gram Stain NO WBC SEEN NO ORGANISMS SEEN   Final   Report Status 12/08/2016 FINAL  Final         Radiology Studies: No results found.      Scheduled Meds: . aspirin EC  81 mg Oral Daily  . azithromycin  500 mg Oral Daily  . bisoprolol  10 mg Oral Daily  . enoxaparin (LOVENOX) injection  40 mg Subcutaneous Q24H  . multivitamin with minerals  1 tablet Oral Daily  . omega-3 acid ethyl esters  1 g Oral Daily  . simvastatin  20 mg Oral QHS  . sodium chloride flush  3 mL Intravenous Q12H  . sodium chloride flush  3 mL Intravenous Q12H   Continuous Infusions: . sodium chloride    . sodium chloride 75 mL/hr at 12/11/16 1719  . cefTRIAXone (ROCEPHIN)  IV Stopped (12/10/16 2100)     LOS: 4 days    Time spent: 40 minutes.     Hosie Poisson, MD [facility] Pager [phone]  If 7PM-7AM, please  contact night-coverage [email or web address] Password [email or web address] 12/11/2016, 7:22 PM

## 2016-12-11 NOTE — Progress Notes (Signed)
Physical Therapy Treatment Patient Details Name: Denise Bonilla MRN: [id number] DOB: 1929/06/27 Today's Date: 12/11/2016    History of Present Illness Pt is a 83 yo female admitted with c/o of not feeling well, with decreased appetite and UTI, dx with pleural effusion s/p thorocentisis to remove 1.6L of fluid. PMH significant for UTI, HTN, and dementia. Dementia has been exacerbated by hospital stay.      PT Comments    Pt is making good progress towards her goals. Pt is currently min guard for transfer to RW and ambulation of 250 feet with RW. Pt daughter stated that pt walked last night with nursing without the RW. PT encourages pt to practice using the RW for now so that she maintains her stability with ambulation. Pt requires skilled PT in the acute setting to continue gait training and to increase LE strength and endurance to safely mobilize in her home environment at discharge.     Follow Up Recommendations  No PT follow up;Supervision/Assistance - 24 hour     Equipment Recommendations  None recommended by PT    Recommendations for Other Services       Precautions / Restrictions Precautions Precautions: Fall Restrictions Weight Bearing Restrictions: No    Mobility  Bed Mobility               General bed mobility comments: in recliner at entry  Transfers Overall transfer level: Needs assistance Equipment used: None;Rolling walker (2 wheeled) Transfers: Sit to/from Stand Sit to Stand: Min guard         General transfer comment: min guard for safety, good power up and hand placement on RW  Ambulation/Gait Ambulation/Gait assistance: Min guard Ambulation Distance (Feet): 250 Feet Assistive device: Rolling walker (2 wheeled) Gait Pattern/deviations: Step-through pattern;Decreased step length - right;Decreased step length - left;Trunk flexed Gait velocity: slowed Gait velocity interpretation: Below normal speed for age/gender General Gait Details: min guard for  safety, slow, steady gait with decreased step length, vc for staying inside the RW and upright posture        Balance Overall balance assessment: Needs assistance Sitting-balance support: Feet supported;No upper extremity supported Sitting balance-Leahy Scale: Good     Standing balance support: No upper extremity supported Standing balance-Leahy Scale: Fair Standing balance comment: able to stand unsupported while staying within [employer] Arousal/Alertness: Awake/alert Behavior During Therapy: [employer] for tasks assessed/performed Overall Cognitive Status: Impaired/Different from baseline Area of Impairment: Memory;Safety/judgement                     Memory: Decreased short-term memory   Safety/Judgement: Decreased awareness of safety;Decreased awareness of deficits            Exercises General Exercises - Lower Extremity Ankle Circles/Pumps: AROM;Both;10 reps;Seated Quad Sets: AROM;Both;10 reps;Seated Gluteal Sets: AROM;Both;10 reps;Seated Hip ABduction/ADduction: AROM;Both;10 reps;Seated Hip Flexion/Marching: AROM;Both;10 reps;Seated    General Comments General comments (skin integrity, edema, etc.): Pt daughter present in room       Pertinent Vitals/Pain Pain Assessment: No/denies pain  VSS    Home Living Family/patient expects to be discharged to:: Private residence Living Arrangements: Children Available Help at Discharge: Family;Available 24 hours/day Type of Home: House Home Access: Stairs to enter Entrance Stairs-Rails: Right Home Layout: Two level;Able to live on main level with bedroom/bathroom Home Equipment: Walker - 2 wheels;Hand held shower head  Prior Function Level of Independence: Independent;Needs assistance  Gait / Transfers Assistance Needed: independent with ambulation around house and stairs ADL's / Homemaking Assistance Needed: help with iADLs, showers on her own  Comments: community  ambulator and occasional driver   PT Goals (current goals can now be found in the care plan section) Acute Rehab PT Goals Patient Stated Goal: go home PT Goal Formulation: With patient/family Time For Goal Achievement: 12/23/16 Potential to Achieve Goals: Good Progress towards PT goals: Progressing toward goals    Frequency    Min 3X/week      PT Plan Current plan remains appropriate       AM-PAC PT "6 Clicks" Daily Activity  Outcome Measure  Difficulty turning over in bed (including adjusting bedclothes, sheets and blankets)?: A Little Difficulty moving from lying on back to sitting on the side of the bed? : A Little Difficulty sitting down on and standing up from a chair with arms (e.g., wheelchair, bedside commode, etc,.)?: A Little Help needed moving to and from a bed to chair (including a wheelchair)?: None Help needed walking in hospital room?: None Help needed climbing 3-5 steps with a railing? : A Little 6 Click Score: 20    End of Session Equipment Utilized During Treatment: Gait belt Activity Tolerance: Patient tolerated treatment well Patient left: in chair;with call bell/phone within reach;with family/visitor present Nurse Communication: Mobility status PT Visit Diagnosis: Other abnormalities of gait and mobility (R26.89);Muscle weakness (generalized) (M62.81)     Time: 8757-9728 PT Time Calculation (min) (ACUTE ONLY): 17 min  Charges:  $Gait Training: 8-22 mins $Therapeutic Exercise: 8-22 mins                    G Codes:       Mattew Chriswell B. Migdalia Dk PT, DPT Acute Rehabilitation  [phone] Pager [phone]     [place] 12/11/2016, 3:17 PM

## 2016-12-11 NOTE — Progress Notes (Signed)
Patient ID: Denise Bonilla, female   DOB: 20-Nov-1929, 83 y.o.   MRN: [id number] S:Wants to go home, no complaints O:BP (!) 129/57 (BP Location: Left Arm)   Pulse (!) 58   Temp 98.3 F (36.8 C) (Oral)   Resp 18   Ht 4\' 11"  (1.499 m)   Wt 71.3 kg (157 lb 3.2 oz)   SpO2 97%   BMI 31.75 kg/m   Intake/Output Summary (Last 24 hours) at 12/11/16 1415 Last data filed at 12/11/16 1300  Gross per 24 hour  Intake          2023.75 ml  Output             2495 ml  Net          -471.25 ml   Intake/Output: I/O last 3 completed shifts: In: 2703.8 [P.O.:1230; I.V.:1373.8; IV Piggyback:100] Out: 3299 [Urine:3345]  Intake/Output this shift:  Total I/O In: 240 [P.O.:240] Out: 300 [Urine:300] Weight change: 0.006 kg (0.2 oz) Gen:WD WN WF in NAD CVS:no rub Resp:decreased BS at left base MEQ:ASTMHD QQI:WLNLGXQ pretibial edema   Recent Labs Lab 12/07/16 1947 12/08/16 0253 12/08/16 1455 12/08/16 1814 12/09/16 0706 12/09/16 1801 12/10/16 0711 12/10/16 1832 12/11/16 0326  NA  --  123*  --  120* 125* 121* 121* 120* 124*  K  --  3.4*  --  3.8 3.7 4.0 3.8 4.9 3.7  CL  --  88*  --  86* 88* 85* 86* 84* 90*  CO2  --  26  --  25 27 25 27 26 27   GLUCOSE  --  91  --  163* 95 138* 99 129* 94  BUN  --  5*  --  8 8 10 9 10 8   CREATININE  --  0.80  --  0.82 0.75 0.85 0.80 0.80 0.65  ALBUMIN 3.6  --  3.5  --   --   --   --   --  2.7*  CALCIUM  --  9.0  --  8.9 9.1 8.7* 8.6* 9.2 8.9  PHOS  --   --   --   --   --   --   --   --  3.0  AST  --   --  22  --   --   --   --   --   --   ALT  --   --  18  --   --   --   --   --   --    Liver Function Tests:  Recent Labs Lab 12/07/16 1947 12/08/16 1455 12/11/16 0326  AST  --  22  --   ALT  --  18  --   ALKPHOS  --  66  --   BILITOT  --  0.8  --   PROT  --  6.3*  --   ALBUMIN 3.6 3.5 2.7*   No results for input(s): LIPASE, AMYLASE in the last 168 hours. No results for input(s): AMMONIA in the last 168 hours. CBC:  Recent Labs Lab 12/07/16 1210   WBC 6.5  HGB 13.0  HCT 37.1  MCV 88.8  PLT 344   Cardiac Enzymes:  Recent Labs Lab 12/07/16 1210  TROPONINI <0.03   CBG: No results for input(s): GLUCAP in the last 168 hours.  Iron Studies: No results for input(s): IRON, TIBC, TRANSFERRIN, FERRITIN in the last 72 hours. Studies/Results: No results found. Marland Kitchen aspirin EC  81 mg Oral Daily  . azithromycin  500 mg Oral Daily  . bisoprolol  10 mg Oral Daily  . enoxaparin (LOVENOX) injection  40 mg Subcutaneous Q24H  . multivitamin with minerals  1 tablet Oral Daily  . omega-3 acid ethyl esters  1 g Oral Daily  . simvastatin  20 mg Oral QHS  . sodium chloride flush  3 mL Intravenous Q12H  . sodium chloride flush  3 mL Intravenous Q12H    BMET    Component Value Date/Time   NA 124 (L) 12/11/2016 0326   K 3.7 12/11/2016 0326   CL 90 (L) 12/11/2016 0326   CO2 27 12/11/2016 0326   GLUCOSE 94 12/11/2016 0326   BUN 8 12/11/2016 0326   CREATININE 0.65 12/11/2016 0326   CALCIUM 8.9 12/11/2016 0326   GFRNONAA >60 12/11/2016 0326   GFRAA >60 12/11/2016 0326   CBC    Component Value Date/Time   WBC 6.5 12/07/2016 1210   RBC 4.18 12/07/2016 1210   HGB 13.0 12/07/2016 1210   HCT 37.1 12/07/2016 1210   PLT 344 12/07/2016 1210   MCV 88.8 12/07/2016 1210   MCH 31.1 12/07/2016 1210   MCHC 35.0 12/07/2016 1210   RDW 12.6 12/07/2016 1210   LYMPHSABS 1.3 12/01/2016 1235   MONOABS 0.6 12/01/2016 1235   EOSABS 0.0 12/01/2016 1235   BASOSABS 0.0 12/01/2016 1235    Assessment/Plan: 1. Hyponatremia- in setting of large left pleural effusion. Thoracentesis results appears exudative (parapneumonic vs malignant). Awaiting cultures and cytology. No lung mass seen on CT and had normal EF on ECHO but did have grad 1 diastolic dysfunction. Her urine studies are appropriately low Uosm (but not maximally dilllute likely due to her poor nutrition and low protein intake).  1. Repeat urine Na consistent with volume depletion (<10).  Will  hold diuretics and started IV NS 12/10/16 with improvement of sodium to 124.   2. follow I's/O's as well as sodium levels 3. Likely related to her pulmonary issue as no offending agent has been identified 2. Left pleural effusion- as above, work up underway. Awaiting cytologies and cultures but worrisome for exudative effusion.  Cytology pending. 3. Right sided CHF- peripheral edema has improved.  4. Hypokalemia- will also check TSH and cortisol levels 5. HTN- on bisoprolol, lisinopril on hold 6. Hyperlipidemia- on statin   Denise Potts, MD [facility] [phone]

## 2016-12-11 NOTE — Progress Notes (Signed)
Patient with no complaints or concerns during 7pm - 7am shift. Slept during the night.   Macklyn Glandon, RN 

## 2016-12-12 ENCOUNTER — Encounter (HOSPITAL_COMMUNITY): Payer: Self-pay | Admitting: Radiology

## 2016-12-12 ENCOUNTER — Inpatient Hospital Stay (HOSPITAL_COMMUNITY): Payer: Medicare Other

## 2016-12-12 LAB — BASIC METABOLIC PANEL
Anion gap: 7 (ref 5–15)
BUN: 9 mg/dL (ref 6–20)
CALCIUM: 9.1 mg/dL (ref 8.9–10.3)
CO2: 27 mmol/L (ref 22–32)
CREATININE: 0.7 mg/dL (ref 0.44–1.00)
Chloride: 95 mmol/L — ABNORMAL LOW (ref 101–111)
GLUCOSE: 90 mg/dL (ref 65–99)
Potassium: 4 mmol/L (ref 3.5–5.1)
Sodium: 129 mmol/L — ABNORMAL LOW (ref 135–145)

## 2016-12-12 MED ORDER — FUROSEMIDE 20 MG PO TABS
20.0000 mg | ORAL_TABLET | Freq: Every day | ORAL | Status: DC
  Administered 2016-12-12 – 2016-12-13 (×2): 20 mg via ORAL
  Filled 2016-12-12 (×2): qty 1

## 2016-12-12 MED ORDER — IOPAMIDOL (ISOVUE-300) INJECTION 61%
30.0000 mL | Freq: Once | INTRAVENOUS | Status: AC | PRN
  Administered 2016-12-12: 100 mL via ORAL

## 2016-12-12 MED ORDER — IOPAMIDOL (ISOVUE-300) INJECTION 61%
INTRAVENOUS | Status: AC
  Filled 2016-12-12: qty 100

## 2016-12-12 NOTE — Progress Notes (Signed)
Pt's daughter wants her mom to roll her down in a hallway in a wheelchair, since daughter is her primary caregiver an taking care of her Mom, RN let her wheel down her mom, they verbalize the understanding of safety and policy of hospital

## 2016-12-12 NOTE — Progress Notes (Signed)
Patient ID: Denise Bonilla, female   DOB: 08/07/1929, 83 y.o.   MRN: [id number] S:No new complaints, "I want to go home" O:BP (!) 123/52 (BP Location: Left Arm)   Pulse 60   Temp 97.7 F (36.5 C) (Oral)   Resp 17   Ht 4\' 11"  (1.499 m)   Wt 71.4 kg (157 lb 4.8 oz)   SpO2 97%   BMI 31.77 kg/m   Intake/Output Summary (Last 24 hours) at 12/12/16 1014 Last data filed at 12/12/16 0609  Gross per 24 hour  Intake          1486.25 ml  Output             3100 ml  Net         -1613.75 ml   Intake/Output: I/O last 3 completed shifts: In: 3150 [P.O.:720; I.V.:2330; IV Piggyback:100] Out: 4775 [Urine:4775]  Intake/Output this shift:  No intake/output data recorded. Weight change: 0.045 kg (1.6 oz) Gen:NAD CVS:no rub Resp:decreased BS at left base UKG:URKYHC WCB:JSEGBTD pretibial edema   Recent Labs Lab 12/07/16 1947  12/08/16 1455 12/08/16 1814 12/09/16 0706 12/09/16 1801 12/10/16 0711 12/10/16 1832 12/11/16 0326 12/12/16 0317  NA  --   < >  --  120* 125* 121* 121* 120* 124* 129*  K  --   < >  --  3.8 3.7 4.0 3.8 4.9 3.7 4.0  CL  --   < >  --  86* 88* 85* 86* 84* 90* 95*  CO2  --   < >  --  25 27 25 27 26 27 27   GLUCOSE  --   < >  --  163* 95 138* 99 129* 94 90  BUN  --   < >  --  8 8 10 9 10 8 9   CREATININE  --   < >  --  0.82 0.75 0.85 0.80 0.80 0.65 0.70  ALBUMIN 3.6  --  3.5  --   --   --   --   --  2.7*  --   CALCIUM  --   < >  --  8.9 9.1 8.7* 8.6* 9.2 8.9 9.1  PHOS  --   --   --   --   --   --   --   --  3.0  --   AST  --   --  22  --   --   --   --   --   --   --   ALT  --   --  18  --   --   --   --   --   --   --   < > = values in this interval not displayed. Liver Function Tests:  Recent Labs Lab 12/07/16 1947 12/08/16 1455 12/11/16 0326  AST  --  22  --   ALT  --  18  --   ALKPHOS  --  66  --   BILITOT  --  0.8  --   PROT  --  6.3*  --   ALBUMIN 3.6 3.5 2.7*   No results for input(s): LIPASE, AMYLASE in the last 168 hours. No results for input(s):  AMMONIA in the last 168 hours. CBC:  Recent Labs Lab 12/07/16 1210  WBC 6.5  HGB 13.0  HCT 37.1  MCV 88.8  PLT 344   Cardiac Enzymes:  Recent Labs Lab 12/07/16 1210  TROPONINI <0.03   CBG: No results for input(s):  GLUCAP in the last 168 hours.  Iron Studies: No results for input(s): IRON, TIBC, TRANSFERRIN, FERRITIN in the last 72 hours. Studies/Results: No results found. Marland Kitchen aspirin EC  81 mg Oral Daily  . azithromycin  500 mg Oral Daily  . bisoprolol  10 mg Oral Daily  . enoxaparin (LOVENOX) injection  40 mg Subcutaneous Q24H  . multivitamin with minerals  1 tablet Oral Daily  . omega-3 acid ethyl esters  1 g Oral Daily  . simvastatin  20 mg Oral QHS  . sodium chloride flush  3 mL Intravenous Q12H  . sodium chloride flush  3 mL Intravenous Q12H    BMET    Component Value Date/Time   NA 129 (L) 12/12/2016 0317   K 4.0 12/12/2016 0317   CL 95 (L) 12/12/2016 0317   CO2 27 12/12/2016 0317   GLUCOSE 90 12/12/2016 0317   BUN 9 12/12/2016 0317   CREATININE 0.70 12/12/2016 0317   CALCIUM 9.1 12/12/2016 0317   GFRNONAA >60 12/12/2016 0317   GFRAA >60 12/12/2016 0317   CBC    Component Value Date/Time   WBC 6.5 12/07/2016 1210   RBC 4.18 12/07/2016 1210   HGB 13.0 12/07/2016 1210   HCT 37.1 12/07/2016 1210   PLT 344 12/07/2016 1210   MCV 88.8 12/07/2016 1210   MCH 31.1 12/07/2016 1210   MCHC 35.0 12/07/2016 1210   RDW 12.6 12/07/2016 1210   LYMPHSABS 1.3 12/01/2016 1235   MONOABS 0.6 12/01/2016 1235   EOSABS 0.0 12/01/2016 1235   BASOSABS 0.0 12/01/2016 1235     Assessment/Plan: 1. Hyponatremia- in setting of large left pleural effusion. Thoracentesis results appears exudative (parapneumonic vs malignant). Awaiting cultures and cytology. No lung mass seen on CT and had normal EF on ECHO but did have grad 1 diastolic dysfunction. Her urine studies are appropriately low Uosm (but not maximally dilllute likely due to her poor nutrition and low protein  intake).  1. Repeat urine Na consistent with volume depletion (<10). Will hold diuretics and started IV NS 12/10/16 with improvement of sodium to 124 and now 129.  2. follow I's/O's as well as sodium levels 3. Stop IVF's and follow sodium response prior to discharge.   4. Would add po lasix 20mg  daily to help with her CHF and sodium levels 5. Would not resume lisinopril given hyponatremia and f/u with her PCP 6. Encourage increased protein intake  2. Left pleural effusion- as above, work up underway. Cultures negative to date but worrisome for exudative effusion. Cytology pending. 3. Right sided CHF- peripheral edema has improved.  4. Hypokalemia- will also check TSH and cortisol levels 5. HTN- on bisoprolol, lisinopril on hold 6. Hyperlipidemia- on statin 7. Disposition- awaiting cytology and will need to maintain serum sodium levels off of IVFs prior to discharge  [name], MD [facility] [phone]

## 2016-12-12 NOTE — Progress Notes (Signed)
PROGRESS NOTE    Denise Bonilla  ZOX:[id number] DOB: 1930-02-17 DOA: 12/07/2016 PCP: Monico Blitz, MD    Brief Narrative: 83 year old lady  With prior h/o hypertension, hyperlipidemia, dementia, was broughti n to ED by her daughters for generalized malaise, loss of appetite,,. She was found to have mod to large left pleural effusion and hyponatremia. She underwent thoracentesis and about 1.6 lit of pleural fluid removed and cultures and cytology sent. Cultures have been negative and pathology called to say cytology looks suspicious for malignancy and they are sending it for further stains. Discussed the results with the patient's daughter at bedside.  We will go ahead and get a CT abd and pelvis to look for primary.  Assessment & Plan:   Principal Problem:   Pleural effusion due to CHF (congestive heart failure) (HCC) Active Problems:   Hyponatremia   Ascites   HTN (hypertension)   Multiple thyroid nodules   Large left pleural effusion: s/p thoracentesis, appears exudative. Possibly para pneumonic effusion vs malignancy.  Negative cultures so far. Is off oxygen and breathing good. Not in any distress.  Cytology pending, pathology notified that it could possibly be malignant, further testing with stains.  CT chest, no lung mass.  2 d echocardiogram with grade 1 diastolic dysfunction.  Get CT abd and pelvis to look for primary and mets.    Para pneumonic effusion: Treating the effusion with IV rocephin and zithromax.  Negative cultures so far.    Hyponatremia:  Volume depleted.  Gentle hydration with NS, improvement of sodium to 129. Stopped he IV fluids and start her on low dose lasix.  Renal consulted and recommendations given.    Hypertension:  Controlled.   Dementia:  Moderate, no agitation.    Thyroid nodule: US showed multinodular goiter.  Possibly need biopsy after we get the cytology of the sputum.  Awaiting final results from cytology of the pleural fluid.       DVT prophylaxis: lovenox.  Code Status: (Full) Family Communication: family at bedside.  Disposition Plan: home in 1 to 2 days.    Consultants:   Nephrology.    Procedures: none.    Antimicrobials: rocephin and zithromax from 7/1   Subjective: Reports feeling  Better, no chest pain or sob.  No nausea, or vomiting.  No abdominal pain.  Wants to go home.   Objective: Vitals:   12/11/16 0349 12/11/16 1204 12/11/16 2100 12/12/16 0608  BP: (!) 124/46 (!) 129/57 (!) 145/54 (!) 123/52  Pulse: 64 (!) 58 62 60  Resp: 18 18 18 17   Temp: 97.9 F (36.6 C) 98.3 F (36.8 C) 98 F (36.7 C) 97.7 F (36.5 C)  TempSrc: Oral Oral Oral Oral  SpO2: 93% 97% 95% 97%  Weight: 71.3 kg (157 lb 3.2 oz)   71.4 kg (157 lb 4.8 oz)  Height:        Intake/Output Summary (Last 24 hours) at 12/12/16 1110 Last data filed at 12/12/16 0609  Gross per 24 hour  Intake          1486.25 ml  Output             3100 ml  Net         -1613.75 ml   Filed Weights   12/10/16 0447 12/11/16 0349 12/12/16 0608  Weight: 71.3 kg (157 lb 3 oz) 71.3 kg (157 lb 3.2 oz) 71.4 kg (157 lb 4.8 oz)    Examination:  General exam: Appears calm and comfortable  Respiratory  system: diminished at bases.  Cardiovascular system: S1 & S2 heard, RRR. No JVD, murmurs, rubs, gallops or clicks.  Gastrointestinal system: Abdomen is nondistended, soft and nontender. No organomegaly or masses felt. Normal bowel sounds heard. Central nervous system: Alert and oriented. No focal neurological deficits. Extremities: Symmetric 5 x 5 power. 1+ pedal edema.  Skin: No rashes, lesions or ulcers Psychiatry:  Mood & affect appropriate.     Data Reviewed: I have personally reviewed following labs and imaging studies  CBC:  Recent Labs Lab 12/07/16 1210  WBC 6.5  HGB 13.0  HCT 37.1  MCV 88.8  PLT 071   Basic Metabolic Panel:  Recent Labs Lab 12/09/16 1801 12/10/16 0711 12/10/16 1832 12/11/16 0326 12/12/16 0317   NA 121* 121* 120* 124* 129*  K 4.0 3.8 4.9 3.7 4.0  CL 85* 86* 84* 90* 95*  CO2 25 27 26 27 27   GLUCOSE 138* 99 129* 94 90  BUN 10 9 10 8 9   CREATININE 0.85 0.80 0.80 0.65 0.70  CALCIUM 8.7* 8.6* 9.2 8.9 9.1  PHOS  --   --   --  3.0  --    GFR: Estimated Creatinine Clearance: 44.2 mL/min (by C-G formula based on SCr of 0.7 mg/dL). Liver Function Tests:  Recent Labs Lab 12/07/16 1947 12/08/16 1455 12/11/16 0326  AST  --  22  --   ALT  --  18  --   ALKPHOS  --  66  --   BILITOT  --  0.8  --   PROT  --  6.3*  --   ALBUMIN 3.6 3.5 2.7*   No results for input(s): LIPASE, AMYLASE in the last 168 hours. No results for input(s): AMMONIA in the last 168 hours. Coagulation Profile: No results for input(s): INR, PROTIME in the last 168 hours. Cardiac Enzymes:  Recent Labs Lab 12/07/16 1210  TROPONINI <0.03   BNP (last 3 results) No results for input(s): PROBNP in the last 8760 hours. HbA1C: No results for input(s): HGBA1C in the last 72 hours. CBG: No results for input(s): GLUCAP in the last 168 hours. Lipid Profile: No results for input(s): CHOL, HDL, LDLCALC, TRIG, CHOLHDL, LDLDIRECT in the last 72 hours. Thyroid Function Tests: No results for input(s): TSH, T4TOTAL, FREET4, T3FREE, THYROIDAB in the last 72 hours. Anemia Panel: No results for input(s): VITAMINB12, FOLATE, FERRITIN, TIBC, IRON, RETICCTPCT in the last 72 hours. Sepsis Labs: No results for input(s): PROCALCITON, LATICACIDVEN in the last 168 hours.  Recent Results (from the past 240 hour(s))  Culture, body fluid-bottle     Status: None (Preliminary result)   Collection Time: 12/08/16 11:32 AM  Result Value Ref Range Status   Specimen Description PLEURAL LEFT  Final   Special Requests NONE  Final   Culture NO GROWTH 3 DAYS  Final   Report Status PENDING  Incomplete  Gram stain     Status: None   Collection Time: 12/08/16 11:32 AM  Result Value Ref Range Status   Specimen Description PLEURAL LEFT   Final   Special Requests NONE  Final   Gram Stain NO WBC SEEN NO ORGANISMS SEEN   Final   Report Status 12/08/2016 FINAL  Final         Radiology Studies: No results found.      Scheduled Meds: . aspirin EC  81 mg Oral Daily  . azithromycin  500 mg Oral Daily  . bisoprolol  10 mg Oral Daily  . enoxaparin (LOVENOX) injection  40 mg Subcutaneous Q24H  . furosemide  20 mg Oral Daily  . multivitamin with minerals  1 tablet Oral Daily  . omega-3 acid ethyl esters  1 g Oral Daily  . simvastatin  20 mg Oral QHS  . sodium chloride flush  3 mL Intravenous Q12H  . sodium chloride flush  3 mL Intravenous Q12H   Continuous Infusions: . sodium chloride    . cefTRIAXone (ROCEPHIN)  IV Stopped (12/11/16 2057)     LOS: 5 days    Time spent: 35 minutes.     Hosie Poisson, MD [facility] Pager [phone]  If 7PM-7AM, please contact night-coverage [email or web address] Password [email or web address] 12/12/2016, 11:10 AM

## 2016-12-12 NOTE — Progress Notes (Signed)
Patient sitting in the geri chair, Denies any pain. Two daughters at bedside. Patient already finished two nottle of contrast. CT tech  notified.

## 2016-12-12 NOTE — Progress Notes (Signed)
Physical Therapy Treatment and Discharge Patient Details Name: Denise Bonilla MRN: [id number] DOB: December 06, 1929 Today's Date: 12/12/2016    History of Present Illness Pt is a 83 yo female admitted with c/o of not feeling well, with decreased appetite and UTI, dx with pleural effusion s/p thorocentisis to remove 1.6L of fluid. PMH significant for UTI, HTN, and dementia. Dementia has been exacerbated by hospital stay.      PT Comments    Pt is capable of independence with transfers and ambulation of over 250 feet as well as supervision for ascent/descent of 10 stairs. Given pt's low level dementia PT encourages pt to ambulate with RW while in the hospital to gain familiarity with using walker for stability so that if it is necessary for use in the future it will not be a novel experience. Pt and family educated on purpose for RW training and use and are agreeable for use while in hospital. Pt has met all of her goals and in not in need of skilled PT services at this time. PT recommends that pt stay on mobility team schedule. If pt's mobility status changes please request reevaluation. Thank you.   Follow Up Recommendations  No PT follow up;Supervision/Assistance - 24 hour     Equipment Recommendations  None recommended by PT    Recommendations for Other Services       Precautions / Restrictions Precautions Precautions: Fall Restrictions Weight Bearing Restrictions: No    Mobility  Bed Mobility               General bed mobility comments: in recliner at entry  Transfers Overall transfer level: Needs assistance Equipment used: None;Rolling walker (2 wheeled) Transfers: Sit to/from Stand Sit to Stand: Modified independent (Device/Increase time);Independent         General transfer comment: mod I with RW  Ambulation/Gait Ambulation/Gait assistance: Independent;Modified independent (Device/Increase time) Ambulation Distance (Feet): 250 Feet Assistive device: Rolling walker (2  wheeled) Gait Pattern/deviations: Step-through pattern;Trunk flexed;Decreased stride length Gait velocity: increased Gait velocity interpretation: at or above normal speed for age/gender General Gait Details: supervision for safety    Stairs Stairs: Yes   Stair Management: Two rails;Step to pattern;Alternating pattern;Forwards Number of Stairs: 10 General stair comments: steady ascent with step over step pattern and descent with step to pattern     Balance Overall balance assessment: Needs assistance Sitting-balance support: Feet supported;No upper extremity supported Sitting balance-Leahy Scale: Good     Standing balance support: No upper extremity supported Standing balance-Leahy Scale: Good Standing balance comment: able to stand unsupported and reach for things outside her BoS                            Cognition Arousal/Alertness: Awake/alert Behavior During Therapy: WFL for tasks assessed/performed Overall Cognitive Status: Impaired/Different from baseline Area of Impairment: Memory;Safety/judgement                     Memory: Decreased short-term memory   Safety/Judgement: Decreased awareness of safety;Decreased awareness of deficits               General Comments General comments (skin integrity, edema, etc.): Given pt's low level dementia, training for walking in RW so that using a RW is familiar      Pertinent Vitals/Pain Pain Assessment: No/denies pain    Home Living Family/patient expects to be discharged to:: Private residence Living Arrangements: Children Available Help at Discharge: Family;Available 24 hours/day Type  of Home: House Home Access: Stairs to enter Entrance Stairs-Rails: Right Home Layout: Two level;Able to live on main level with bedroom/bathroom Home Equipment: Gilford Rile - 2 wheels;Hand held shower head      Prior Function Level of Independence: Independent;Needs assistance  Gait / Transfers Assistance Needed:  independent with ambulation around house and stairs ADL's / Homemaking Assistance Needed: help with iADLs, showers on her own  Comments: community ambulator and occasional driver   PT Goals (current goals can now be found in the care plan section) Acute Rehab PT Goals Patient Stated Goal: go home PT Goal Formulation: With patient/family Time For Goal Achievement: 12/23/16 Potential to Achieve Goals: Good Progress towards PT goals: Goals met/education completed, patient discharged from PT       PT Plan Current plan remains appropriate;Other (comment) (d/c from PT )       AM-PAC PT "6 Clicks" Daily Activity  Outcome Measure  Difficulty turning over in bed (including adjusting bedclothes, sheets and blankets)?: None Difficulty moving from lying on back to sitting on the side of the bed? : None Difficulty sitting down on and standing up from a chair with arms (e.g., wheelchair, bedside commode, etc,.)?: None Help needed moving to and from a bed to chair (including a wheelchair)?: None Help needed walking in hospital room?: None Help needed climbing 3-5 steps with a railing? : None 6 Click Score: 24    End of Session Equipment Utilized During Treatment: Gait belt Activity Tolerance: Patient tolerated treatment well Patient left: in chair;with call bell/phone within reach;with family/visitor present Nurse Communication: Mobility status       Time: 5486-2824 PT Time Calculation (min) (ACUTE ONLY): 17 min  Charges:  $Gait Training: 8-22 mins                    G Codes:       Denise Bonilla B. Migdalia Dk PT, DPT Acute Rehabilitation  [phone] Pager [phone]     [place] 12/12/2016, 3:42 PM

## 2016-12-12 NOTE — Progress Notes (Signed)
Physical Therapy Discharge Patient Details Name: Denise Bonilla MRN: [id number] DOB: [birth year] Today's Date: 12/12/2016 Time: 8916-9450 PT Time Calculation (min) (ACUTE ONLY): 17 min  Patient discharged from PT services secondary to goals met and no further PT needs identified.  Please see latest therapy progress note for current level of functioning and progress toward goals.    Progress and discharge plan discussed with patient and/or caregiver: Patient/Caregiver agrees with plan  GP   Dani Gobble. Migdalia Dk PT, DPT Acute Rehabilitation  [phone] Pager [phone]    [place] 12/12/2016, 3:49 PM

## 2016-12-13 DIAGNOSIS — C801 Malignant (primary) neoplasm, unspecified: Secondary | ICD-10-CM

## 2016-12-13 LAB — CULTURE, BODY FLUID W GRAM STAIN -BOTTLE: Culture: NO GROWTH

## 2016-12-13 LAB — BASIC METABOLIC PANEL
Anion gap: 7 (ref 5–15)
BUN: 7 mg/dL (ref 6–20)
CALCIUM: 9.4 mg/dL (ref 8.9–10.3)
CO2: 25 mmol/L (ref 22–32)
CREATININE: 0.75 mg/dL (ref 0.44–1.00)
Chloride: 98 mmol/L — ABNORMAL LOW (ref 101–111)
Glucose, Bld: 99 mg/dL (ref 65–99)
Potassium: 4.2 mmol/L (ref 3.5–5.1)
SODIUM: 130 mmol/L — AB (ref 135–145)

## 2016-12-13 LAB — CULTURE, BODY FLUID-BOTTLE

## 2016-12-13 MED ORDER — FUROSEMIDE 20 MG PO TABS
20.0000 mg | ORAL_TABLET | Freq: Every day | ORAL | 0 refills | Status: AC
Start: 2016-12-14 — End: ?

## 2016-12-13 NOTE — Discharge Summary (Signed)
Physician Discharge Summary  Denise Bonilla ONG:[id number] DOB: 1929/12/15 DOA: 12/07/2016  PCP: Monico Blitz, MD  Admit date: 12/07/2016 Discharge date: 12/13/2016  Admitted From:Home.  Disposition:  Home.   Recommendations for Outpatient Follow-up:  1. Follow up with PCP in 1-2 weeks 2. Please obtain BMP/CBC in one week 3. Please follow up with Dr Alvy Bimler in one week for evaluation of right ovarian mass. 4. Please follow up with PCP regarding the multinodular goiter, you will probably need a biopsy of the thyroid nodule.  5. Please follow up the final results of the cytology from pleural fluid.     Discharge Condition:stable.  CODE STATUS:full code.  Diet recommendation: Heart Healthy  Brief/Interim Summary: 83 year old lady  With prior h/o hypertension, hyperlipidemia, dementia, was broughti n to ED by her daughters for generalized malaise, loss of appetite,,. She was found to have mod to large left pleural effusion and hyponatremia. She underwent thoracentesis and about 1.6 lit of pleural fluid removed and cultures and cytology sent. Cultures have been negative and pathology called to say cytology looks suspicious for malignancy and they are sending it for further stains. Discussed the results with the patient's daughter at bedside.  CT abd and pelvis ordered showed right adnexal mass, suspicious for ovarian cancer.  Oncology consulted and recommended outpatient follow up.  Patient did not want to wait to speak with oncology and wanted to be discharged.  Oncology office will call the patient for follow up appt.   Discharge Diagnoses:  Principal Problem:   Pleural effusion due to CHF (congestive heart failure) (HCC) Active Problems:   Hyponatremia   Ascites   HTN (hypertension)   Multiple thyroid nodules  Large left pleural effusion: s/p thoracentesis, appears exudative. Possibly para pneumonic effusion vs malignancy.  Negative cultures so far. Is off oxygen and breathing good. Not  in any distress.  Cytology pending, pathology notified that it could possibly be malignant, further testing with stains.  CT chest, no lung mass.  2 d echocardiogram with grade 1 diastolic dysfunction.  Get CT abd and pelvis showed right adnexal mass, consulted oncology, outpatient follow up.  Discussed the results with the patient and family at bedside.    Para pneumonic effusion: Completed the treatment with rocephin and zithromax.  Negative cultures so far.    Hyponatremia:  Volume depleted on admission.  Gentle hydration with NS, improvement of sodium to 129. Stopped he IV fluids and started her on low dose lasix.  Renal consulted and recommendations given.    Hypertension:  Controlled.   Dementia:  Moderate, no agitation.    Thyroid nodule: US showed multinodular goiter.  Possibly need biopsy after we get the cytology of the sputum.  Awaiting final results from cytology of the pleural fluid.    Chronic diastolic heart failure:  She appears compensated.  Echo reviewed.  On low dose lasix.   Discharge Instructions  Discharge Instructions    Diet - low sodium heart healthy    Complete by:  As directed    Discharge instructions    Complete by:  As directed    Please follow up with Dr Alvy Bimler as recommended, their office will call for an appointment.  Please follow up with PCP in one week.  Please follow up the blood work done today.     Allergies as of 12/13/2016      Reactions   Tetanus Toxoids Anaphylaxis   Codeine Nausea And Vomiting      Medication List  STOP taking these medications   cephALEXin 500 MG capsule Commonly known as:  KEFLEX   lisinopril 10 MG tablet Commonly known as:  PRINIVIL,ZESTRIL     TAKE these medications   aspirin EC 81 MG tablet Take 81 mg by mouth daily.   bisoprolol 10 MG tablet Commonly known as:  ZEBETA Take 10 mg by mouth daily.   Fish Oil 500 MG Caps Take 1 capsule by mouth daily.   furosemide 20  MG tablet Commonly known as:  LASIX Take 1 tablet (20 mg total) by mouth daily. Start taking on:  12/14/2016   MULTI FOR HER 50+ Tabs Take 1 tablet by mouth daily.   simvastatin 20 MG tablet Commonly known as:  ZOCOR Take 20 mg by mouth at bedtime.      Follow-up Information    Monico Blitz, MD. Schedule an appointment as soon as possible for a visit in 1 week(s).   Specialty:  Internal Medicine Why:  follow up with PCP in one week, post hospitalization visit and follwo up the tumour markers.  Contact information: [place] [address] [phone]        Heath Lark, MD. Schedule an appointment as soon as possible for a visit in 1 week(s).   Specialty:  Hematology and Oncology Why:  please call to make appt in one week.  Contact information: [place] [address] [phone]          Allergies  Allergen Reactions  . Tetanus Toxoids Anaphylaxis  . Codeine Nausea And Vomiting    Consultations:  Oncology over the phone   Nephrology.   Procedures/Studies: Dg Chest 1 View  Result Date: 12/08/2016 CLINICAL DATA:  Post thoracentesis EXAM: CHEST 1 VIEW COMPARISON:  12/08/2016 FINDINGS: Moderate-sized left pleural effusion, decreased following thoracentesis. No pneumothorax. Improving aeration in the left lung with continued left lower lobe atelectasis or infiltrate. No confluent opacity on the right. IMPRESSION: Decreasing left effusion following thoracentesis.  No pneumothorax. Electronically Signed   By: Rolm Baptise M.D.   On: 12/08/2016 12:03   Dg Chest 2 View  Result Date: 12/07/2016 CLINICAL DATA:  Dry cough for 1 week. EXAM: CHEST  2 VIEW COMPARISON:  12/01/2016 FINDINGS: The cardiomediastinal silhouette is obscured by a large left pleural effusion. Calcific atherosclerotic disease of the aorta. No evidence of pneumothorax. The left pleural effusion has enlarged when compared to the prior radiograph, occupying approximately 2/3 of  the left hemithorax. Osteoarthritic changes of the spine and kyphotic deformity, with mild chronic appearing compression deformity of multiple lower thoracic and upper lumbar vertebral bodies. Soft tissues are grossly normal. IMPRESSION: Enlarged left pleural effusion. The left lung parenchyma is mostly obscured by it. Small right pleural effusion.  Right lung clear. Calcific atherosclerotic disease of the aorta. Electronically Signed   By: Fidela Salisbury M.D.   On: 12/07/2016 12:59   Dg Chest 2 View  Result Date: 12/01/2016 CLINICAL DATA:  Weakness and not eating well.  Possible fever. EXAM: CHEST  2 VIEW COMPARISON:  None. FINDINGS: Lungs are adequately inflated with opacification over the lower 60% of the left thorax likely large effusion with associated atelectasis. Cannot exclude infection over the left mid to lower lung. Mild cardiomegaly. Minimal calcification over the thoracic aorta. Degenerative change of the spine. IMPRESSION: Moderate opacification over the left mid to lower lung likely large effusion with atelectasis. Cannot exclude infection in the left mid to lower lung. Recommend follow-up to resolution. Cardiomegaly. Electronically Signed  By: Marin Olp M.D.   On: 12/01/2016 13:16   Ct Angio Chest Pe W/cm &/or Wo Cm  Result Date: 12/07/2016 CLINICAL DATA:  Weakness.  Pleural effusions. EXAM: CT ANGIOGRAPHY CHEST WITH CONTRAST TECHNIQUE: Multidetector CT imaging of the chest was performed using the standard protocol during bolus administration of intravenous contrast. Multiplanar CT image reconstructions and MIPs were obtained to evaluate the vascular anatomy. CONTRAST:  100 cc Isovue 370 intravenously. COMPARISON:  Chest radiograph 12/07/2016 FINDINGS: Cardiovascular: Satisfactory opacification of the pulmonary arteries to the segmental level. No evidence of pulmonary embolism. Normal heart size. No pericardial effusion. Aortic atherosclerosis. Mediastinum/Nodes: No enlarged  mediastinal, hilar, or axillary lymph nodes. The trachea, and esophagus demonstrate no significant findings. Heterogeneous appearance of the thyroid gland. Lungs/Pleura: The bronchial tree to the level of the lobar branches is intact. There is large left and small right pleural effusion. Evaluation of the lung parenchyma as suboptimal due to motion artifact, but no pulmonary masses are seen within the aerated lung. There is significant compressive atelectasis of the left lung, secondary to the large pleural effusion with complete collapse of the left lower lobe, and partial collapse of the lingula. There is interstitial pulmonary edema. Upper Abdomen: Small volume abdominal ascites. Musculoskeletal: No chest wall abnormality. No acute or significant osseous findings. Review of the MIP images confirms the above findings. IMPRESSION: No evidence of pulmonary embolus. Large left and small right pleural effusions. Complete collapse of the left lower lobe, partial collapse of the lingula and milder atelectasis of the left upper lobe, likely due to compressive atelectasis. The collapsed lung parenchyma is poorly evaluated, but no obvious pulmonary masses are seen. Interstitial pulmonary edema. Small volume abdominal ascites. Heterogeneous appearance of the thyroid gland, with right sided nodules. Aortic Atherosclerosis (ICD10-I70.0). Electronically Signed   By: Fidela Salisbury M.D.   On: 12/07/2016 16:40   Ct Abdomen Pelvis W Contrast  Result Date: 12/12/2016 CLINICAL DATA:  Left pleural effusion with cytology suspicious for malignancy. Concern for intra- abdominal malignancy/metastasis. EXAM: CT ABDOMEN AND PELVIS WITH CONTRAST TECHNIQUE: Multidetector CT imaging of the abdomen and pelvis was performed using the standard protocol following bolus administration of intravenous contrast. CONTRAST:  146mL ISOVUE-300 IOPAMIDOL (ISOVUE-300) INJECTION 61% COMPARISON:  No prior abdominal imaging.  Chest CTA 12/07/2016  FINDINGS: Lower chest: Left pleural effusion has decreased in size from prior exam post thoracentesis. Persistent adjacent left lung base atelectasis. Tiny right pleural effusion and adjacent atelectasis. Hepatobiliary: Nonspecific ill-defined low density in the inferior right lobe of the liver image 30 series 3 measures approximately 18 mm. Subcapsular lesion measures 14 mm with peripheral enhancement, may be a hemangioma, incompletely characterized. Pancreas: No ductal dilatation or inflammation. Spleen: Normal in size without focal abnormality. Adrenals/Urinary Tract: Normal adrenal glands, no adrenal nodule. No hydronephrosis or perinephric edema. 9 mm low-density lesion in the posterior mid right kidney is too small to characterize. Small subcentimeter low-density lesions in the lower left kidney, also too small to characterize. Symmetric excretion on delayed phase imaging. Urinary bladder is physiologically distended. Stomach/Bowel: Stomach is nondistended. No bowel obstruction or inflammation. Moderate stool burden throughout the colon with stool distending the rectum. Minimal sigmoid colonic diverticulosis, no diverticulitis. No evidence of colonic mass. Vascular/Lymphatic: Abdominal aortic atherosclerosis. Retroaortic left renal vein. No retroperitoneal adenopathy. No mesenteric adenopathy. No definite pelvic adenopathy. Reproductive: Large right adnexal cystic structure measures 8.5 x 6.5 cm. There internal septations. Left ovary not well-defined, no gross left adnexal mass. Uterus is abnormal in appearance for  age. Endometrial thickening or fluid in the endometrial canal. Posterior heterogeneous lesion in the uterus may be a fibroid, unusual for age. Other: Small volume of upper abdominal ascites. Fluid is mildly complex. Minimal fluid tracks in both pericolic gutters in the mesenteries. There is no definite omental soft tissue density, however mild stranding and ascites is seen. Musculoskeletal: No  blastic osseous lesions. No evidence of destructive lytic lesion. Bones are under mineralized which limits assessment. Scoliosis and degenerative change in the lumbar spine. IMPRESSION: 1. Septated right adnexal cyst measuring 8.5 x 6.5 cm, concerning for ovarian malignancy in this setting. Small volume complex intra-abdominal ascites which tracks in the pericolic gutters and anterior omentum. No definite soft tissue omental caking. 2. Endometrial thickening versus fluid in the endometrial canal, nonspecific. Rounded 3.9 cm lesion in the uterine fundus may be a fibroid, however would be unusual for patient's age. Endometrial/ uterine neoplasm is considered. 3. Nonspecific ill-defined low-density lesion in the right lobe of the liver. Probable hepatic hemangioma at the dome. 4. Further evaluation of the GYN structures could be performed with ultrasound. MRI may provide further detail if needed for potential surgical planning. 5. Aortic atherosclerosis. Electronically Signed   By: Jeb Levering M.D.   On: 12/12/2016 23:42   Dg Chest Port 1 View  Result Date: 12/12/2016 CLINICAL DATA:  Follow-up left pleural effusion. EXAM: PORTABLE CHEST 1 VIEW COMPARISON:  12/08/2016. FINDINGS: Cardiomegaly normal pulmonary vascularity. Progressive left base atelectasis and infiltrate. Left base infiltrate cannot be excluded. Small left pleural effusion is unchanged. IMPRESSION: 1. Progressive left base atelectasis and infiltrate. Small left pleural effusion again noted. 2.  Cardiomegaly, no evidence of pulmonary venous congestion. Electronically Signed   By: Marcello Moores  Register   On: 12/12/2016 11:59   Dg Chest Port 1 View  Result Date: 12/08/2016 CLINICAL DATA:  Left pleural effusion EXAM: PORTABLE CHEST 1 VIEW COMPARISON:  12/07/2016 FINDINGS: Large left pleural effusion with left lower lobe atelectasis, unchanged. Probable cardiomegaly. Right lung is clear. No change since prior study. IMPRESSION: Stable large left pleural  effusion with left lung atelectasis. Electronically Signed   By: Rolm Baptise M.D.   On: 12/08/2016 11:10   US Thyroid  Result Date: 12/09/2016 CLINICAL DATA:  Incidental on CT. Thyroid nodules incidentally seen on chest CT. EXAM: THYROID ULTRASOUND TECHNIQUE: Ultrasound examination of the thyroid gland and adjacent soft tissues was performed. COMPARISON:  Chest CT -12/07/2016 FINDINGS: Parenchymal Echotexture: Mildly heterogenous Isthmus: Normal in size measuring 0.2 cm in diameter Right lobe: Normal in size measuring 4.1 x 2.1 x 1.6 cm Left lobe: Normal in size measuring 3.4 x 1.5 x 1.1 cm _________________________________________________________ Estimated total number of nodules >/= 1 cm: 2 Number of spongiform nodules >/=  2 cm not described below (TR1): 0 Number of mixed cystic and solid nodules >/= 1.5 cm not described below (TR2): 0 _________________________________________________________ There is an approximately 1.5 x 0.7 x 1.2 cm anechoic cyst within the superior pole of the right lobe of the thyroid, which correlates with the nodule seen on preceding chest CT, which contains an eccentric echogenic calcification with ring down artifact suggestive of colloid. This nodule does not meet imaging criteria to recommend percutaneous sampling or dedicated follow-up There are several additional smaller punctate (sub 9 mm) cysts, several of which also contain benign colloid, and none of which meet imaging criteria to recommend percutaneous sampling or dedicated follow-up. There is a punctate (approximately 0.8 x 0.5 x 0.7 cm) anechoic cyst within the superior pole the  left lobe of the thyroid as well as a smaller (approximately 0.3 cm) anechoic cyst within the inferior pole the left lobe of the thyroid, both of which contain eccentric echogenic calcification with ring down artifact suggestive of colloid. Neither nodule meets imaging criteria to recommend percutaneous sampling or dedicated follow-up. IMPRESSION:  1. Findings suggestive of multinodular goiter. 2. None of the discretely measured thyroid nodules/cysts, including the approximately 1.5 cm cyst within the right lobe of the thyroid which correlates with the nodule seen on preceding chest CT, meet imaging criteria to recommend percutaneous sampling or dedicated follow-up. The above is in keeping with the ACR TI-RADS recommendations - J Am Coll Radiol 2017;14:587-595. Electronically Signed   By: Sandi Mariscal M.D.   On: 12/09/2016 12:43   US Thoracentesis Asp Pleural Space W/img Guide  Result Date: 12/10/2016 INDICATION: Large left pleural effusion noted on recent imaging. Request made for diagnostic and therapeutic thoracentesis. EXAM: ULTRASOUND GUIDED DIAGNOSTIC AND THERAPEUTIC THORACENTESIS MEDICATIONS: 1% lidocaine COMPLICATIONS: None immediate. PROCEDURE: An ultrasound guided thoracentesis was thoroughly discussed with the patient's daughter and questions answered. The benefits, risks, alternatives and complications were also discussed. The patient's daughter understands and wishes to proceed with the procedure. Written consent was obtained. Ultrasound was performed to localize and mark an adequate pocket of fluid in the left chest. The area was then prepped and draped in the normal sterile fashion. 1% Lidocaine was used for local anesthesia. Under ultrasound guidance a Safe-T-Centesis catheter was introduced. Thoracentesis was performed. The catheter was removed and a dressing applied. FINDINGS: A total of approximately 1.6 L of slightly dark, serous fluid was removed. Samples were sent to the laboratory as requested by the clinical team. IMPRESSION: Successful ultrasound guided left thoracentesis yielding 1.6 L of pleural fluid. The procedure was terminated early secondary to significant coughing. Read by: Saverio Danker, PA-C Electronically Signed   By: Markus Daft M.D.   On: 12/08/2016 11:47       Subjective:  No chest pain ,no sob. No headache, or  dizziness.  No abd pain. Wants to go home as soon as possitble.   Discharge Exam: Vitals:   12/12/16 2142 12/13/16 0558  BP: (!) 155/65 (!) 146/62  Pulse: 61 64  Resp:  18  Temp:  98.1 F (36.7 C)   Vitals:   12/12/16 1218 12/12/16 2127 12/12/16 2142 12/13/16 0558  BP: (!) 113/47 (!) 187/75 (!) 155/65 (!) 146/62  Pulse: 60 66 61 64  Resp: 18 18  18   Temp: 97.7 F (36.5 C) 98.3 F (36.8 C)  98.1 F (36.7 C)  TempSrc: Oral Oral  Oral  SpO2: 98% 96% 97% 96%  Weight:    71.6 kg (157 lb 12.8 oz)  Height:        General: Pt is alert, awake, not in acute distress Cardiovascular: RRR, S1/S2 +, no rubs, no gallops Respiratory: CTA bilaterally, no wheezing, no rhonchi Abdominal: Soft, NT, ND, bowel sounds + Extremities: no edema, no cyanosis    The results of significant diagnostics from this hospitalization (including imaging, microbiology, ancillary and laboratory) are listed below for reference.     Microbiology: Recent Results (from the past 240 hour(s))  Culture, body fluid-bottle     Status: None (Preliminary result)   Collection Time: 12/08/16 11:32 AM  Result Value Ref Range Status   Specimen Description PLEURAL LEFT  Final   Special Requests NONE  Final   Culture NO GROWTH 4 DAYS  Final   Report Status PENDING  Incomplete  Gram stain     Status: None   Collection Time: 12/08/16 11:32 AM  Result Value Ref Range Status   Specimen Description PLEURAL LEFT  Final   Special Requests NONE  Final   Gram Stain NO WBC SEEN NO ORGANISMS SEEN   Final   Report Status 12/08/2016 FINAL  Final     Labs: BNP (last 3 results)  Recent Labs  12/07/16 1352  BNP 638.7*   Basic Metabolic Panel:  Recent Labs Lab 12/10/16 0711 12/10/16 1832 12/11/16 0326 12/12/16 0317 12/13/16 0738  NA 121* 120* 124* 129* 130*  K 3.8 4.9 3.7 4.0 4.2  CL 86* 84* 90* 95* 98*  CO2 27 26 27 27 25   GLUCOSE 99 129* 94 90 99  BUN 9 10 8 9 7   CREATININE 0.80 0.80 0.65 0.70 0.75   CALCIUM 8.6* 9.2 8.9 9.1 9.4  PHOS  --   --  3.0  --   --    Liver Function Tests:  Recent Labs Lab 12/07/16 1947 12/08/16 1455 12/11/16 0326  AST  --  22  --   ALT  --  18  --   ALKPHOS  --  66  --   BILITOT  --  0.8  --   PROT  --  6.3*  --   ALBUMIN 3.6 3.5 2.7*   No results for input(s): LIPASE, AMYLASE in the last 168 hours. No results for input(s): AMMONIA in the last 168 hours. CBC:  Recent Labs Lab 12/07/16 1210  WBC 6.5  HGB 13.0  HCT 37.1  MCV 88.8  PLT 344   Cardiac Enzymes:  Recent Labs Lab 12/07/16 1210  TROPONINI <0.03   BNP: Invalid input(s): POCBNP CBG: No results for input(s): GLUCAP in the last 168 hours. D-Dimer No results for input(s): DDIMER in the last 72 hours. Hgb A1c No results for input(s): HGBA1C in the last 72 hours. Lipid Profile No results for input(s): CHOL, HDL, LDLCALC, TRIG, CHOLHDL, LDLDIRECT in the last 72 hours. Thyroid function studies No results for input(s): TSH, T4TOTAL, T3FREE, THYROIDAB in the last 72 hours.  Invalid input(s): FREET3 Anemia work up No results for input(s): VITAMINB12, FOLATE, FERRITIN, TIBC, IRON, RETICCTPCT in the last 72 hours. Urinalysis    Component Value Date/Time   COLORURINE YELLOW 12/09/2016 2031   APPEARANCEUR HAZY (A) 12/09/2016 2031   LABSPEC 1.015 12/09/2016 2031   PHURINE 5.0 12/09/2016 2031   GLUCOSEU NEGATIVE 12/09/2016 2031   HGBUR NEGATIVE 12/09/2016 2031   BILIRUBINUR NEGATIVE 12/09/2016 2031   KETONESUR NEGATIVE 12/09/2016 2031   PROTEINUR NEGATIVE 12/09/2016 2031   NITRITE NEGATIVE 12/09/2016 2031   LEUKOCYTESUR NEGATIVE 12/09/2016 2031   Sepsis Labs Invalid input(s): PROCALCITONIN,  WBC,  LACTICIDVEN Microbiology Recent Results (from the past 240 hour(s))  Culture, body fluid-bottle     Status: None (Preliminary result)   Collection Time: 12/08/16 11:32 AM  Result Value Ref Range Status   Specimen Description PLEURAL LEFT  Final   Special Requests NONE  Final    Culture NO GROWTH 4 DAYS  Final   Report Status PENDING  Incomplete  Gram stain     Status: None   Collection Time: 12/08/16 11:32 AM  Result Value Ref Range Status   Specimen Description PLEURAL LEFT  Final   Special Requests NONE  Final   Gram Stain NO WBC SEEN NO ORGANISMS SEEN   Final   Report Status 12/08/2016 FINAL  Final     Time coordinating  discharge: Over 30 minutes  SIGNED:   Hosie Poisson, MD  [facility] 12/13/2016, 11:58 AM Pager   If 7PM-7AM, please contact night-coverage [email or web address] Password [email or web address]

## 2016-12-13 NOTE — Progress Notes (Signed)
Pt discharge education provided at bedside with pt and pt daughters. Pt IV discontinued, catheter intact and telemetry removed. Pt has all belongings including home medications. Pt discharged via wheelchair with nurse tech  Pt daughters aware to schedule follow up appointments, stated MD told her the office would call. Office number provided   [employer]

## 2016-12-13 NOTE — Progress Notes (Signed)
MD aware oncologist has not seen pt, MD states pt will follow outpt, okay to discharge   Whitman Meinhardt

## 2016-12-14 LAB — CANCER ANTIGEN 19-9: CA 19-9: 357 U/mL — ABNORMAL HIGH (ref 0–35)

## 2016-12-14 LAB — CA 125: Cancer Antigen (CA) 125: 1310 U/mL — ABNORMAL HIGH (ref 0.0–38.1)

## 2016-12-16 ENCOUNTER — Telehealth: Payer: Self-pay | Admitting: *Deleted

## 2016-12-16 NOTE — Telephone Encounter (Signed)
Per staff message from Dr. Burr Medico and Dr. Alvy Bimler, scheduled appts for Monday July 16th. Contacted the patient and spoke to the Shirlean Mylar the daughter. Gave her the date/times of the appts on Monday July 16th.

## 2016-12-23 ENCOUNTER — Ambulatory Visit: Payer: Medicare Other | Attending: Gynecologic Oncology | Admitting: Gynecologic Oncology

## 2016-12-23 ENCOUNTER — Encounter: Payer: Self-pay | Admitting: Hematology and Oncology

## 2016-12-23 ENCOUNTER — Ambulatory Visit (HOSPITAL_BASED_OUTPATIENT_CLINIC_OR_DEPARTMENT_OTHER): Payer: Medicare Other | Admitting: Hematology and Oncology

## 2016-12-23 ENCOUNTER — Telehealth: Payer: Self-pay | Admitting: Hematology and Oncology

## 2016-12-23 ENCOUNTER — Other Ambulatory Visit: Payer: Self-pay | Admitting: Radiology

## 2016-12-23 VITALS — BP 151/72 | HR 76 | Temp 98.3°F | Resp 20 | Ht 60.04 in | Wt 151.9 lb

## 2016-12-23 VITALS — BP 144/70 | HR 69 | Temp 97.6°F | Resp 18 | Ht 60.0 in | Wt 155.0 lb

## 2016-12-23 DIAGNOSIS — K5909 Other constipation: Secondary | ICD-10-CM | POA: Diagnosis not present

## 2016-12-23 DIAGNOSIS — Z8249 Family history of ischemic heart disease and other diseases of the circulatory system: Secondary | ICD-10-CM | POA: Insufficient documentation

## 2016-12-23 DIAGNOSIS — R64 Cachexia: Secondary | ICD-10-CM | POA: Diagnosis not present

## 2016-12-23 DIAGNOSIS — Z7189 Other specified counseling: Secondary | ICD-10-CM | POA: Diagnosis not present

## 2016-12-23 DIAGNOSIS — Z803 Family history of malignant neoplasm of breast: Secondary | ICD-10-CM | POA: Diagnosis not present

## 2016-12-23 DIAGNOSIS — E7800 Pure hypercholesterolemia, unspecified: Secondary | ICD-10-CM | POA: Diagnosis not present

## 2016-12-23 DIAGNOSIS — I10 Essential (primary) hypertension: Secondary | ICD-10-CM | POA: Insufficient documentation

## 2016-12-23 DIAGNOSIS — R110 Nausea: Secondary | ICD-10-CM | POA: Insufficient documentation

## 2016-12-23 DIAGNOSIS — Z7982 Long term (current) use of aspirin: Secondary | ICD-10-CM | POA: Diagnosis not present

## 2016-12-23 DIAGNOSIS — J910 Malignant pleural effusion: Secondary | ICD-10-CM | POA: Diagnosis not present

## 2016-12-23 DIAGNOSIS — Z79899 Other long term (current) drug therapy: Secondary | ICD-10-CM | POA: Diagnosis not present

## 2016-12-23 DIAGNOSIS — N83201 Unspecified ovarian cyst, right side: Secondary | ICD-10-CM

## 2016-12-23 DIAGNOSIS — Z888 Allergy status to other drugs, medicaments and biological substances status: Secondary | ICD-10-CM | POA: Diagnosis not present

## 2016-12-23 DIAGNOSIS — Z809 Family history of malignant neoplasm, unspecified: Secondary | ICD-10-CM | POA: Insufficient documentation

## 2016-12-23 DIAGNOSIS — R140 Abdominal distension (gaseous): Secondary | ICD-10-CM

## 2016-12-23 DIAGNOSIS — C561 Malignant neoplasm of right ovary: Secondary | ICD-10-CM | POA: Diagnosis not present

## 2016-12-23 DIAGNOSIS — R971 Elevated cancer antigen 125 [CA 125]: Secondary | ICD-10-CM

## 2016-12-23 DIAGNOSIS — Z9049 Acquired absence of other specified parts of digestive tract: Secondary | ICD-10-CM | POA: Insufficient documentation

## 2016-12-23 DIAGNOSIS — F0390 Unspecified dementia without behavioral disturbance: Secondary | ICD-10-CM | POA: Diagnosis not present

## 2016-12-23 DIAGNOSIS — R188 Other ascites: Secondary | ICD-10-CM | POA: Insufficient documentation

## 2016-12-23 DIAGNOSIS — N83209 Unspecified ovarian cyst, unspecified side: Secondary | ICD-10-CM

## 2016-12-23 HISTORY — DX: Malignant neoplasm of right ovary: C56.1

## 2016-12-23 NOTE — Telephone Encounter (Signed)
Gave patient avs report and appointments for July. [facility] will call re port placement.

## 2016-12-23 NOTE — Assessment & Plan Note (Addendum)
I have discussed this with GYN oncologist. She is a good candidate for neoadjuvant chemotherapy followed by radiation treatment. I will attempt to arrange port placement, chemo education class, lab work and see her back for chemo consent next week we will plan to start treatment early next week. I recommend carboplatin and Taxol with G-CSF support The goal would be to give her minimum 3 cycles before surgery.  I will order repeat blood work and CT scan before cycle 4 to determine if she is a good surgical candidate or not.  If not, we would extend to 6 cycles of treatment before surgery. She has strong family history of breast cancer I recommend genetic counseling and she agreed to be referred

## 2016-12-23 NOTE — Assessment & Plan Note (Signed)
The patient is aware she has possibly curable disease and treatment is given in neoadjuvant fashion. We discussed importance of Advanced Directives and Living will. We discussed CODE STATUS; the patient desires to remain in full code. Her 2 daughters, Bailey Mech and Joseph Art are dedicated healthcare power of attorney.  A copy of the MPOA paperwork is scanned

## 2016-12-23 NOTE — Progress Notes (Signed)
Consult Note: Gyn-Onc  Consult was requested by Dr. Karleen Hampshire for the evaluation of Denise Bonilla 83 y.o. female  CC:  Chief Complaint  Patient presents with  . Cyst of ovary, unspecified laterality  . Malignant Pleural Effusion    gyn primary on special stains  . Ascites  . elevated CA 125    Assessment/Plan:  Denise Bonilla  is a 83 y.o.  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  Together we reviewed her CT from 12/12/16.   I discussed that I believe she likely has stage  IV ovarian cancer. I discussed that the treatment approach for this disease is typically combination of cytoreductive surgery and chemotherapy. I discussed that sequencing of this can be either with upfront debulking followed by adjuvant chemotherapy sequentially or neoadjuvant chemotherapy followed by an interval cytoreductive attempt, then additional chemotherapy. This latter approach is associated with a reduced perioperative morbidity at the time of surgery. I discussed that the goal of optimal sequencing is to optimise the likelihood that cytoreductive effect can be optimal to less than 1 cm of residual disease, and would not induce morbidity for the patient that would result in a delay of adjuvant chemotherapy. I discussed that it is an individual decision process that takes into account individual patient health, and preference factors, in addition to the apparent tumor distribution on imaging. I discussed that the overall survival observed in patients is equivalent for both approaches provided that there is an optimal cytoreductive effort at the time of surgery (regardless of the timing of that surgery).  Given her advanced age and large volume of left pleural fluid (which might make ventilation intra and postoperatively a challenge), I am recommending neoadjvant chemotherapy with 3 cycles of carboplatin and paclitaxel followed by restaging imaging and interval debulking if she has improved  clinically on imaging. Followed by 3 additional cycles of chemotherapy.  We discussed the prognosis of this cancer and the high likelihood of "remission" but low likelihood of "cure". I discussed that while she is of advanced age and has dementia, her baseline performance status is very good and she is very high functioning and fairly independent, therefore I believe it is reasonable to proceed with curative intent therapy.   She will require genetics counseling assessment given the diagnosis of ovarian cancer. THis has been ordered. She will see my colleague, Dr Heath Lark, from medical oncology later today to discuss chemotherapy.  HPI: Denise Bonilla is a 83 year old P3 who is seen in consultation at the request of Dr Karleen Hampshire for presumed stage IV right ovarian cancer.  The patient has been having nausea and anorexia (but without weight loss or GI obstruction symptoms) since June, 2018. Her daughter took her to an ED for this where she was treated for UTI. Chest imaging had revealed a large pleural effusion. She had a small right pleural effusion.   CT chest/abdo/pelvis on 12/12/16 showed a left pleural effusion, tiny right pleural effusion. Nonspecific ill defined low density in the inferior right lobe of the liver. No bowel obstruction. Large right adnexal cystic structure measures 8.5x6.5cm with internal septations. Left ovary not well definied. No gross left adnexal mass. Uterus is abnormal in appearance for age with endometrial thinkening and fluid in the canal. Posterior heterogeneous lesion in the uterus may be fibroid. Small volume of upper abdominal ascites.   Cytology from the thoracentesis on 12/08/16 showed metastatic adenocarcinoma with positive stains for CK7, Pax 8, WT1, negative for  CK 20 and ER. Overall favored gyn primary.  CA 125 on 12/13/16 was elevated at 1310 CA 19-9 was elevated at 357.  The patient has had a prior left ovarian cystectomy (possible LSO) remotely in the past for a  benign ovarian cyst.  She has a history of dementia but is currently living with her daughter and fairly independent with most activities. She is a fair historian.  Current Meds:  Outpatient Encounter Prescriptions as of 12/23/2016  Medication Sig  . aspirin EC 81 MG tablet Take 81 mg by mouth daily.  . bisoprolol (ZEBETA) 10 MG tablet Take 10 mg by mouth daily.  . furosemide (LASIX) 20 MG tablet Take 1 tablet (20 mg total) by mouth daily.  . Multiple Vitamins-Minerals (MULTI FOR HER 50+) TABS Take 1 tablet by mouth daily.  . Omega-3 Fatty Acids (FISH OIL) 500 MG CAPS Take 1 capsule by mouth daily.  . simvastatin (ZOCOR) 20 MG tablet Take 20 mg by mouth at bedtime.    No facility-administered encounter medications on file as of 12/23/2016.     Allergy:  Allergies  Allergen Reactions  . Tetanus Toxoids Anaphylaxis  . Codeine Nausea And Vomiting    Social Hx:   Social History   Social History  . Marital status: Married    Spouse name: N/A  . Number of children: N/A  . Years of education: N/A   Occupational History  . Not on file.   Social History Main Topics  . Smoking status: Never Smoker  . Smokeless tobacco: Never Used  . Alcohol use No  . Drug use: No  . Sexual activity: Not on file   Other Topics Concern  . Not on file   Social History Narrative  . No narrative on file    Past Surgical Hx:  Past Surgical History:  Procedure Laterality Date  . CYSTECTOMY      Past Medical Hx:  Past Medical History:  Diagnosis Date  . Dementia   . High cholesterol   . Hypertension   . Ovarian cancer on right [facility]) 12/23/2016    Past Gynecological History:  SVDx 3 No LMP recorded. Patient is postmenopausal.  Family Hx:  Family History  Problem Relation Age of Onset  . Cancer Other   . Hyperlipidemia Other   . Hypertension Other   . Heart failure Mother     Review of Systems:  Constitutional  Feels well,    ENT Normal appearing ears and nares  bilaterally Skin/Breast  No rash, sores, jaundice, itching, dryness Cardiovascular  No chest pain, shortness of breath, or edema  Pulmonary  No cough or wheeze.  Gastro Intestinal  No nausea, vomitting, or diarrhoea. No bright red blood per rectum, no abdominal pain, change in bowel movement, or constipation.  Genito Urinary  No frequency, urgency, dysuria, no bleeding Musculo Skeletal  No myalgia, arthralgia, joint swelling or pain  Neurologic  No weakness, numbness, change in gait,  Psychology  No depression, anxiety, insomnia.   Vitals:  Blood pressure (!) 151/72, pulse 76, temperature 98.3 F (36.8 C), temperature source Oral, resp. rate 20, height 5' 0.04" (1.525 m), weight 151 lb 14.4 oz (68.9 kg), SpO2 100 %.  Physical Exam: WD in NAD Neck  Supple NROM, without any enlargements.  Lymph Node Survey No cervical supraclavicular or inguinal adenopathy Cardiovascular  Pulse normal rate, regularity and rhythm. S1 and S2 normal.  Lungs  Clear to auscultation bilateraly, without wheezes/crackles/rhonchi. Good air movement.  Skin  No rash/lesions/breakdown  Psychiatry  Alert and oriented to person, place, and time  Abdomen  Normoactive bowel sounds, abdomen soft, non-tender and nonobese without evidence of hernia.  Back No CVA tenderness Genito Urinary  Vulva/vagina: Normal external female genitalia.  No lesions. No discharge or bleeding.  Bladder/urethra:  No lesions or masses, well supported bladder  Vagina: normal  Cervix: Normal appearing, no lesions.  Uterus:  Small, mobile, no parametrial involvement or nodularity.  Adnexa: no palpable masses. Rectal  Good tone, no masses no cul de sac nodularity. Large burden of stool. Extremities  No bilateral cyanosis, clubbing or edema.   Donaciano Eva, MD  12/23/2016, 12:43 PM

## 2016-12-23 NOTE — Patient Instructions (Signed)
Keep your appointment with Dr. Alvy Bimler on 12-23-16 to discuss chemotherapy. Dr. Denman George will see you after three cycles of chemotherapy for evaluation for surgery.

## 2016-12-23 NOTE — Assessment & Plan Note (Signed)
She has persistent left sided pleural effusion but not symptomatic with this. She had diagnostic and therapeutic thoracentesis recently.  Continue close observation

## 2016-12-23 NOTE — Assessment & Plan Note (Signed)
We discussed frequent small meals. I suspect part of the because of cachexia due to constipation and malignancy. Hopefully, she will improve after treatment is started

## 2016-12-23 NOTE — Progress Notes (Signed)
[facility] CONSULT NOTE  Patient Care Team: Monico Blitz, MD as PCP - General (Internal Medicine)  CHIEF COMPLAINTS/PURPOSE OF CONSULTATION:  Ovarian cancer, for neoadjuvant treatment  HISTORY OF PRESENTING ILLNESS:  Denise Bonilla 83 y.o. female is here because of recent diagnosis of ovarian cancer. I review her records extensively. The patient can give fair history.  I review her records and collaborated the history with her to daughters, Denise Bonilla and Denise Bonilla today. The patient have strong family history of breast cancer. The patient had remote history of cystectomy along time ago. She has mild cognitive impairment but is independent in her activities of daily living.  She lives with Denise Bonilla. Summary of oncologic history as follows:   Ovarian cancer on right [facility])   12/07/2016 Imaging    Ct angiogram:  No evidence of pulmonary embolus.  Large left and small right pleural effusions. Complete collapse of the left lower lobe, partial collapse of the lingula and milder atelectasis of the left upper lobe, likely due to compressive atelectasis. The collapsed lung parenchyma is poorly evaluated, but no obvious pulmonary masses are seen.  Interstitial pulmonary edema.  Small volume abdominal ascites.  Heterogeneous appearance of the thyroid gland, with right sided nodules.  Aortic Atherosclerosis (ICD10-I70.0).      12/07/2016 - 12/13/2016 Hospital Admission    She was admitted for generalized malaise, abnormal blood work, large bilateral pleural effusion and possible right ovarian cancer      12/08/2016 Imaging    LV EF: 65% -   70%      12/08/2016 Pathology Results    PLEURAL FLUID, LEFT (SPECIMEN 1 OF 1, COLLECTED ON 12/08/16): - MALIGNANT CELLS CONSISTENT WITH METASTATIC ADENOCARCINOMA, SEE COMMENT      12/08/2016 Procedure    Successful ultrasound guided left thoracentesis yielding 1.6 L of pleural fluid. The procedure was terminated early secondary to significant  coughing.      12/12/2016 Imaging    CT scan of abdomen and pelvis 1. Septated right adnexal cyst measuring 8.5 x 6.5 cm, concerning for ovarian malignancy in this setting. Small volume complex intra-abdominal ascites which tracks in the pericolic gutters and anterior omentum. No definite soft tissue omental caking. 2. Endometrial thickening versus fluid in the endometrial canal, nonspecific. Rounded 3.9 cm lesion in the uterine fundus may be a fibroid, however would be unusual for patient's age. Endometrial/ uterine neoplasm is considered. 3. Nonspecific ill-defined low-density lesion in the right lobe of the liver. Probable hepatic hemangioma at the dome. 4. Further evaluation of the GYN structures could be performed with ultrasound. MRI may provide further detail if needed for potential surgical planning. 5. Aortic atherosclerosis      12/12/2016 Imaging    CXR 1. Progressive left base atelectasis and infiltrate. Small left pleural effusion again noted. 2.  Cardiomegaly, no evidence of pulmonary venous congestion.      12/13/2016 Tumor Marker    Patient's tumor was tested for the following markers: CA-125. Results of the tumor marker test revealed 1310.      She returns with her daughters today. She denies chest pain or shortness of breath. She has poor appetite and early satiety.  She had mild abdominal bloating. She have chronic constipation.  She lives with her daughter, Denise Bonilla.  She is fairly active in activities of daily living. She denies abdominal pain.  MEDICAL HISTORY:  Past Medical History:  Diagnosis Date  . Dementia   . High cholesterol   . Hypertension   . Ovarian cancer  on right [facility]) 12/23/2016    SURGICAL HISTORY: Past Surgical History:  Procedure Laterality Date  . CYSTECTOMY      SOCIAL HISTORY: Social History   Social History  . Marital status: Widowed    Spouse name: N/A  . Number of children: 3  . Years of education: N/A   Occupational History  .  retired    Social History Main Topics  . Smoking status: Never Smoker  . Smokeless tobacco: Never Used  . Alcohol use No  . Drug use: No  . Sexual activity: Not on file   Other Topics Concern  . Not on file   Social History Narrative  . No narrative on file    FAMILY HISTORY: Family History  Problem Relation Age of Onset  . Cancer Other   . Hyperlipidemia Other   . Hypertension Other   . Heart failure Mother   . Cancer Sister        breast ca    ALLERGIES:  is allergic to tetanus toxoids and codeine.  MEDICATIONS:  Current Outpatient Prescriptions  Medication Sig Dispense Refill  . aspirin EC 81 MG tablet Take 81 mg by mouth daily.    . bisoprolol (ZEBETA) 10 MG tablet Take 10 mg by mouth daily.    . furosemide (LASIX) 20 MG tablet Take 1 tablet (20 mg total) by mouth daily. 30 tablet 0  . Multiple Vitamins-Minerals (MULTI FOR HER 50+) TABS Take 1 tablet by mouth daily.    . Omega-3 Fatty Acids (FISH OIL) 500 MG CAPS Take 1 capsule by mouth daily.    . simvastatin (ZOCOR) 20 MG tablet Take 20 mg by mouth at bedtime.      No current facility-administered medications for this visit.     REVIEW OF SYSTEMS:   Constitutional: Denies fevers, chills or abnormal night sweats Eyes: Denies blurriness of vision, double vision or watery eyes Ears, nose, mouth, throat, and face: Denies mucositis or sore throat Respiratory: Denies cough, dyspnea or wheezes Cardiovascular: Denies palpitation, chest discomfort  Skin: Denies abnormal skin rashes Lymphatics: Denies new lymphadenopathy or easy bruising Neurological:Denies numbness, tingling or new weaknesses Behavioral/Psych: Mood is stable, no new changes  All other systems were reviewed with the patient and are negative.  PHYSICAL EXAMINATION: ECOG PERFORMANCE STATUS: 2 - Symptomatic, <50% confined to bed  Vitals:   12/23/16 1323  BP: (!) 144/70  Pulse: 69  Resp: 18  Temp: 97.6 F (36.4 C)   Filed Weights   12/23/16  1323  Weight: 155 lb (70.3 kg)    GENERAL:alert, no distress and comfortable SKIN: skin color, texture, turgor are normal, no rashes or significant lesions EYES: normal, conjunctiva are pink and non-injected, sclera clear OROPHARYNX:no exudate, no erythema and lips, buccal mucosa, and tongue normal  NECK: supple, thyroid normal size, non-tender, without nodularity LYMPH:  no palpable lymphadenopathy in the cervical, axillary or inguinal LUNGS: Normal breathing effort.  Reduced breath sound on the left lung base HEART: regular rate & rhythm and no murmurs with mild bilateral lower extremity edema ABDOMEN:abdomen soft, non-tender and normal bowel sounds Musculoskeletal:no cyanosis of digits and no clubbing  PSYCH: alert & oriented x 3 with fluent speech NEURO: no focal motor/sensory deficits  LABORATORY DATA:  I have reviewed the data as listed Lab Results  Component Value Date   WBC 6.5 12/07/2016   HGB 13.0 12/07/2016   HCT 37.1 12/07/2016   MCV 88.8 12/07/2016   PLT 344 12/07/2016    Recent Labs  12/01/16 1235  12/07/16 1947  12/08/16 1455  12/11/16 0326 12/12/16 0317 12/13/16 0738  NA 130*  < >  --   < >  --   < > 124* 129* 130*  K 3.9  < >  --   < >  --   < > 3.7 4.0 4.2  CL 94*  < >  --   < >  --   < > 90* 95* 98*  CO2 26  < >  --   < >  --   < > 27 27 25   GLUCOSE 109*  < >  --   < >  --   < > 94 90 99  BUN 8  < >  --   < >  --   < > 8 9 7   CREATININE 0.73  < >  --   < >  --   < > 0.65 0.70 0.75  CALCIUM 9.8  < >  --   < >  --   < > 8.9 9.1 9.4  GFRNONAA >60  < >  --   < >  --   < > >60 >60 >60  GFRAA >60  < >  --   < >  --   < > >60 >60 >60  PROT 6.9  --   --   --  6.3*  --   --   --   --   ALBUMIN 3.8  --  3.6  --  3.5  --  2.7*  --   --   AST 27  --   --   --  22  --   --   --   --   ALT 21  --   --   --  18  --   --   --   --   ALKPHOS 73  --   --   --  66  --   --   --   --   BILITOT 0.9  --   --   --  0.8  --   --   --   --   BILIDIR  --   --   --   --   0.1  --   --   --   --   IBILI  --   --   --   --  0.7  --   --   --   --   < > = values in this interval not displayed.  RADIOGRAPHIC STUDIES: I have personally reviewed the radiological images as listed and agreed with the findings in the report. Dg Chest 1 View  Result Date: 12/08/2016 CLINICAL DATA:  Post thoracentesis EXAM: CHEST 1 VIEW COMPARISON:  12/08/2016 FINDINGS: Moderate-sized left pleural effusion, decreased following thoracentesis. No pneumothorax. Improving aeration in the left lung with continued left lower lobe atelectasis or infiltrate. No confluent opacity on the right. IMPRESSION: Decreasing left effusion following thoracentesis.  No pneumothorax. Electronically Signed   By: Rolm Baptise M.D.   On: 12/08/2016 12:03   Dg Chest 2 View  Result Date: 12/07/2016 CLINICAL DATA:  Dry cough for 1 week. EXAM: CHEST  2 VIEW COMPARISON:  12/01/2016 FINDINGS: The cardiomediastinal silhouette is obscured by a large left pleural effusion. Calcific atherosclerotic disease of the aorta. No evidence of pneumothorax. The left pleural effusion has enlarged when compared to the prior radiograph, occupying approximately 2/3 of the left hemithorax. Osteoarthritic changes of the  spine and kyphotic deformity, with mild chronic appearing compression deformity of multiple lower thoracic and upper lumbar vertebral bodies. Soft tissues are grossly normal. IMPRESSION: Enlarged left pleural effusion. The left lung parenchyma is mostly obscured by it. Small right pleural effusion.  Right lung clear. Calcific atherosclerotic disease of the aorta. Electronically Signed   By: Fidela Salisbury M.D.   On: 12/07/2016 12:59   Dg Chest 2 View  Result Date: 12/01/2016 CLINICAL DATA:  Weakness and not eating well.  Possible fever. EXAM: CHEST  2 VIEW COMPARISON:  None. FINDINGS: Lungs are adequately inflated with opacification over the lower 60% of the left thorax likely large effusion with associated atelectasis.  Cannot exclude infection over the left mid to lower lung. Mild cardiomegaly. Minimal calcification over the thoracic aorta. Degenerative change of the spine. IMPRESSION: Moderate opacification over the left mid to lower lung likely large effusion with atelectasis. Cannot exclude infection in the left mid to lower lung. Recommend follow-up to resolution. Cardiomegaly. Electronically Signed   By: Marin Olp M.D.   On: 12/01/2016 13:16   Ct Angio Chest Pe W/cm &/or Wo Cm  Result Date: 12/07/2016 CLINICAL DATA:  Weakness.  Pleural effusions. EXAM: CT ANGIOGRAPHY CHEST WITH CONTRAST TECHNIQUE: Multidetector CT imaging of the chest was performed using the standard protocol during bolus administration of intravenous contrast. Multiplanar CT image reconstructions and MIPs were obtained to evaluate the vascular anatomy. CONTRAST:  100 cc Isovue 370 intravenously. COMPARISON:  Chest radiograph 12/07/2016 FINDINGS: Cardiovascular: Satisfactory opacification of the pulmonary arteries to the segmental level. No evidence of pulmonary embolism. Normal heart size. No pericardial effusion. Aortic atherosclerosis. Mediastinum/Nodes: No enlarged mediastinal, hilar, or axillary lymph nodes. The trachea, and esophagus demonstrate no significant findings. Heterogeneous appearance of the thyroid gland. Lungs/Pleura: The bronchial tree to the level of the lobar branches is intact. There is large left and small right pleural effusion. Evaluation of the lung parenchyma as suboptimal due to motion artifact, but no pulmonary masses are seen within the aerated lung. There is significant compressive atelectasis of the left lung, secondary to the large pleural effusion with complete collapse of the left lower lobe, and partial collapse of the lingula. There is interstitial pulmonary edema. Upper Abdomen: Small volume abdominal ascites. Musculoskeletal: No chest wall abnormality. No acute or significant osseous findings. Review of the MIP  images confirms the above findings. IMPRESSION: No evidence of pulmonary embolus. Large left and small right pleural effusions. Complete collapse of the left lower lobe, partial collapse of the lingula and milder atelectasis of the left upper lobe, likely due to compressive atelectasis. The collapsed lung parenchyma is poorly evaluated, but no obvious pulmonary masses are seen. Interstitial pulmonary edema. Small volume abdominal ascites. Heterogeneous appearance of the thyroid gland, with right sided nodules. Aortic Atherosclerosis (ICD10-I70.0). Electronically Signed   By: Fidela Salisbury M.D.   On: 12/07/2016 16:40   Ct Abdomen Pelvis W Contrast  Result Date: 12/12/2016 CLINICAL DATA:  Left pleural effusion with cytology suspicious for malignancy. Concern for intra- abdominal malignancy/metastasis. EXAM: CT ABDOMEN AND PELVIS WITH CONTRAST TECHNIQUE: Multidetector CT imaging of the abdomen and pelvis was performed using the standard protocol following bolus administration of intravenous contrast. CONTRAST:  158mL ISOVUE-300 IOPAMIDOL (ISOVUE-300) INJECTION 61% COMPARISON:  No prior abdominal imaging.  Chest CTA 12/07/2016 FINDINGS: Lower chest: Left pleural effusion has decreased in size from prior exam post thoracentesis. Persistent adjacent left lung base atelectasis. Tiny right pleural effusion and adjacent atelectasis. Hepatobiliary: Nonspecific ill-defined low density in  the inferior right lobe of the liver image 30 series 3 measures approximately 18 mm. Subcapsular lesion measures 14 mm with peripheral enhancement, may be a hemangioma, incompletely characterized. Pancreas: No ductal dilatation or inflammation. Spleen: Normal in size without focal abnormality. Adrenals/Urinary Tract: Normal adrenal glands, no adrenal nodule. No hydronephrosis or perinephric edema. 9 mm low-density lesion in the posterior mid right Bonilla is too small to characterize. Small subcentimeter low-density lesions in the  lower left Bonilla, also too small to characterize. Symmetric excretion on delayed phase imaging. Urinary bladder is physiologically distended. Stomach/Bowel: Stomach is nondistended. No bowel obstruction or inflammation. Moderate stool burden throughout the colon with stool distending the rectum. Minimal sigmoid colonic diverticulosis, no diverticulitis. No evidence of colonic mass. Vascular/Lymphatic: Abdominal aortic atherosclerosis. Retroaortic left renal vein. No retroperitoneal adenopathy. No mesenteric adenopathy. No definite pelvic adenopathy. Reproductive: Large right adnexal cystic structure measures 8.5 x 6.5 cm. There internal septations. Left ovary not well-defined, no gross left adnexal mass. Uterus is abnormal in appearance for age. Endometrial thickening or fluid in the endometrial canal. Posterior heterogeneous lesion in the uterus may be a fibroid, unusual for age. Other: Small volume of upper abdominal ascites. Fluid is mildly complex. Minimal fluid tracks in both pericolic gutters in the mesenteries. There is no definite omental soft tissue density, however mild stranding and ascites is seen. Musculoskeletal: No blastic osseous lesions. No evidence of destructive lytic lesion. Bones are under mineralized which limits assessment. Scoliosis and degenerative change in the lumbar spine. IMPRESSION: 1. Septated right adnexal cyst measuring 8.5 x 6.5 cm, concerning for ovarian malignancy in this setting. Small volume complex intra-abdominal ascites which tracks in the pericolic gutters and anterior omentum. No definite soft tissue omental caking. 2. Endometrial thickening versus fluid in the endometrial canal, nonspecific. Rounded 3.9 cm lesion in the uterine fundus may be a fibroid, however would be unusual for patient's age. Endometrial/ uterine neoplasm is considered. 3. Nonspecific ill-defined low-density lesion in the right lobe of the liver. Probable hepatic hemangioma at the dome. 4. Further  evaluation of the GYN structures could be performed with ultrasound. MRI may provide further detail if needed for potential surgical planning. 5. Aortic atherosclerosis. Electronically Signed   By: Jeb Levering M.D.   On: 12/12/2016 23:42   Dg Chest Port 1 View  Result Date: 12/12/2016 CLINICAL DATA:  Follow-up left pleural effusion. EXAM: PORTABLE CHEST 1 VIEW COMPARISON:  12/08/2016. FINDINGS: Cardiomegaly normal pulmonary vascularity. Progressive left base atelectasis and infiltrate. Left base infiltrate cannot be excluded. Small left pleural effusion is unchanged. IMPRESSION: 1. Progressive left base atelectasis and infiltrate. Small left pleural effusion again noted. 2.  Cardiomegaly, no evidence of pulmonary venous congestion. Electronically Signed   By: Marcello Moores  Register   On: 12/12/2016 11:59   Dg Chest Port 1 View  Result Date: 12/08/2016 CLINICAL DATA:  Left pleural effusion EXAM: PORTABLE CHEST 1 VIEW COMPARISON:  12/07/2016 FINDINGS: Large left pleural effusion with left lower lobe atelectasis, unchanged. Probable cardiomegaly. Right lung is clear. No change since prior study. IMPRESSION: Stable large left pleural effusion with left lung atelectasis. Electronically Signed   By: Rolm Baptise M.D.   On: 12/08/2016 11:10   US Thyroid  Result Date: 12/09/2016 CLINICAL DATA:  Incidental on CT. Thyroid nodules incidentally seen on chest CT. EXAM: THYROID ULTRASOUND TECHNIQUE: Ultrasound examination of the thyroid gland and adjacent soft tissues was performed. COMPARISON:  Chest CT -12/07/2016 FINDINGS: Parenchymal Echotexture: Mildly heterogenous Isthmus: Normal in size measuring 0.2 cm in  diameter Right lobe: Normal in size measuring 4.1 x 2.1 x 1.6 cm Left lobe: Normal in size measuring 3.4 x 1.5 x 1.1 cm _________________________________________________________ Estimated total number of nodules >/= 1 cm: 2 Number of spongiform nodules >/=  2 cm not described below (TR1): 0 Number of mixed cystic  and solid nodules >/= 1.5 cm not described below (TR2): 0 _________________________________________________________ There is an approximately 1.5 x 0.7 x 1.2 cm anechoic cyst within the superior pole of the right lobe of the thyroid, which correlates with the nodule seen on preceding chest CT, which contains an eccentric echogenic calcification with ring down artifact suggestive of colloid. This nodule does not meet imaging criteria to recommend percutaneous sampling or dedicated follow-up There are several additional smaller punctate (sub 9 mm) cysts, several of which also contain benign colloid, and none of which meet imaging criteria to recommend percutaneous sampling or dedicated follow-up. There is a punctate (approximately 0.8 x 0.5 x 0.7 cm) anechoic cyst within the superior pole the left lobe of the thyroid as well as a smaller (approximately 0.3 cm) anechoic cyst within the inferior pole the left lobe of the thyroid, both of which contain eccentric echogenic calcification with ring down artifact suggestive of colloid. Neither nodule meets imaging criteria to recommend percutaneous sampling or dedicated follow-up. IMPRESSION: 1. Findings suggestive of multinodular goiter. 2. None of the discretely measured thyroid nodules/cysts, including the approximately 1.5 cm cyst within the right lobe of the thyroid which correlates with the nodule seen on preceding chest CT, meet imaging criteria to recommend percutaneous sampling or dedicated follow-up. The above is in keeping with the ACR TI-RADS recommendations - J Am Coll Radiol 2017;14:587-595. Electronically Signed   By: Sandi Mariscal M.D.   On: 12/09/2016 12:43   US Thoracentesis Asp Pleural Space W/img Guide  Result Date: 12/10/2016 INDICATION: Large left pleural effusion noted on recent imaging. Request made for diagnostic and therapeutic thoracentesis. EXAM: ULTRASOUND GUIDED DIAGNOSTIC AND THERAPEUTIC THORACENTESIS MEDICATIONS: 1% lidocaine COMPLICATIONS:  None immediate. PROCEDURE: An ultrasound guided thoracentesis was thoroughly discussed with the patient's daughter and questions answered. The benefits, risks, alternatives and complications were also discussed. The patient's daughter understands and wishes to proceed with the procedure. Written consent was obtained. Ultrasound was performed to localize and mark an adequate pocket of fluid in the left chest. The area was then prepped and draped in the normal sterile fashion. 1% Lidocaine was used for local anesthesia. Under ultrasound guidance a Safe-T-Centesis catheter was introduced. Thoracentesis was performed. The catheter was removed and a dressing applied. FINDINGS: A total of approximately 1.6 L of slightly dark, serous fluid was removed. Samples were sent to the laboratory as requested by the clinical team. IMPRESSION: Successful ultrasound guided left thoracentesis yielding 1.6 L of pleural fluid. The procedure was terminated early secondary to significant coughing. Read by: Saverio Danker, PA-C Electronically Signed   By: Markus Daft M.D.   On: 12/08/2016 11:47    ASSESSMENT & PLAN:  Ovarian cancer on right [facility]) I have discussed this with GYN oncologist. She is a good candidate for neoadjuvant chemotherapy followed by radiation treatment. I will attempt to arrange port placement, chemo education class, lab work and see her back for chemo consent next week we will plan to start treatment early next week. I recommend carboplatin and Taxol with G-CSF support The goal would be to give her minimum 3 cycles before surgery.  I will order repeat blood work and CT scan before cycle 4 to  determine if she is a good surgical candidate or not.  If not, we would extend to 6 cycles of treatment before surgery. She has strong family history of breast cancer I recommend genetic counseling and she agreed to be referred  Malignant pleural effusion She has persistent left sided pleural effusion but not  symptomatic with this. She had diagnostic and therapeutic thoracentesis recently.  Continue close observation  Chronic constipation We discussed the importance of aggressive laxative therapy.  Malignant cachexia ([place]) We discussed frequent small meals. I suspect part of the because of cachexia due to constipation and malignancy. Hopefully, she will improve after treatment is started  Goals of care, counseling/discussion The patient is aware she has possibly curable disease and treatment is given in neoadjuvant fashion. We discussed importance of Advanced Directives and Living will. We discussed CODE STATUS; the patient desires to remain in full code. Her 2 daughters, Bailey Mech and Denise Bonilla are dedicated healthcare power of attorney.  A copy of the MPOA paperwork is scanned  Orders Placed This Encounter  Procedures  . IR FLUORO GUIDE PORT INSERTION RIGHT    Standing Status:   Future    Standing Expiration Date:   02/23/2018    Order Specific Question:   Reason for Exam (SYMPTOM  OR DIAGNOSIS REQUIRED)    Answer:   need port for chemo    Order Specific Question:   Preferred Imaging Location?    Answer:   [facility]     All questions were answered. The patient knows to call the clinic with any problems, questions or concerns. I spent 55 minutes counseling the patient face to face. The total time spent in the appointment was 60 minutes and more than 50% was on counseling.     Heath Lark, MD 12/23/2016 1:57 PM

## 2016-12-23 NOTE — Progress Notes (Signed)
START ON PATHWAY REGIMEN - Ovarian     A cycle is every 21 days:     Paclitaxel      Carboplatin   **Always confirm dose/schedule in your pharmacy ordering system**    Patient Characteristics: Newly Diagnosed, Neoadjuvant Therapy AJCC T Category: T3 AJCC N Category: N0 AJCC M Category: M1 Therapeutic Status: Newly Diagnosed, Neoadjuvant Therapy AJCC 8 Stage Grouping: IV Intent of Therapy: Curative Intent, Discussed with Patient 

## 2016-12-23 NOTE — Assessment & Plan Note (Signed)
We discussed the importance of aggressive laxative therapy 

## 2016-12-24 ENCOUNTER — Encounter: Payer: Self-pay | Admitting: Genetics

## 2016-12-24 ENCOUNTER — Telehealth: Payer: Self-pay | Admitting: Genetics

## 2016-12-24 NOTE — Telephone Encounter (Signed)
Appt has been scheduled for the pt to see Ria Comment for genetic counseling on 7/30 at 1pm. Unable to reach the pt. Will mail a letter.

## 2016-12-25 ENCOUNTER — Encounter (HOSPITAL_COMMUNITY): Payer: Self-pay

## 2016-12-25 ENCOUNTER — Other Ambulatory Visit: Payer: Self-pay | Admitting: Hematology and Oncology

## 2016-12-25 ENCOUNTER — Telehealth: Payer: Self-pay

## 2016-12-25 ENCOUNTER — Ambulatory Visit (HOSPITAL_COMMUNITY)
Admission: RE | Admit: 2016-12-25 | Discharge: 2016-12-25 | Disposition: A | Discharge status: Home / Self Care | Payer: Medicare Other | Source: Ambulatory Visit | Attending: Hematology and Oncology | Admitting: Hematology and Oncology

## 2016-12-25 DIAGNOSIS — F0390 Unspecified dementia without behavioral disturbance: Secondary | ICD-10-CM | POA: Insufficient documentation

## 2016-12-25 DIAGNOSIS — C561 Malignant neoplasm of right ovary: Secondary | ICD-10-CM | POA: Insufficient documentation

## 2016-12-25 DIAGNOSIS — I10 Essential (primary) hypertension: Secondary | ICD-10-CM | POA: Diagnosis not present

## 2016-12-25 DIAGNOSIS — C569 Malignant neoplasm of unspecified ovary: Secondary | ICD-10-CM | POA: Diagnosis present

## 2016-12-25 DIAGNOSIS — E7800 Pure hypercholesterolemia, unspecified: Secondary | ICD-10-CM | POA: Diagnosis not present

## 2016-12-25 HISTORY — PX: IR US GUIDE VASC ACCESS RIGHT: IMG2390

## 2016-12-25 HISTORY — PX: IR FLUORO GUIDE PORT INSERTION RIGHT: IMG5741

## 2016-12-25 LAB — BASIC METABOLIC PANEL
Anion gap: 8 (ref 5–15)
BUN: 11 mg/dL (ref 6–20)
CALCIUM: 9.8 mg/dL (ref 8.9–10.3)
CO2: 26 mmol/L (ref 22–32)
CREATININE: 0.83 mg/dL (ref 0.44–1.00)
Chloride: 92 mmol/L — ABNORMAL LOW (ref 101–111)
GFR calc non Af Amer: >60 mL/min (ref >60)
Glucose, Bld: 110 mg/dL — ABNORMAL HIGH (ref 65–99)
Potassium: 3.8 mmol/L (ref 3.5–5.1)
SODIUM: 126 mmol/L — AB (ref 135–145)

## 2016-12-25 LAB — CBC
HCT: 37.8 % (ref 36.0–46.0)
Hemoglobin: 13.2 g/dL (ref 12.0–15.0)
MCH: 31.1 pg (ref 26.0–34.0)
MCHC: 34.9 g/dL (ref 30.0–36.0)
MCV: 89.2 fL (ref 78.0–100.0)
PLATELETS: 325 10*3/uL (ref 150–400)
RBC: 4.24 MIL/uL (ref 3.87–5.11)
RDW: 13.7 % (ref 11.5–15.5)
WBC: 6.1 10*3/uL (ref 4.0–10.5)

## 2016-12-25 LAB — APTT: aPTT: 31 seconds (ref 24–36)

## 2016-12-25 LAB — PROTIME-INR
INR: 1.03
PROTHROMBIN TIME: 13.5 s (ref 11.4–15.2)

## 2016-12-25 MED ORDER — LIDOCAINE-EPINEPHRINE 1 %-1:100000 IJ SOLN
INTRAMUSCULAR | Status: AC | PRN
  Administered 2016-12-25: 8 mL

## 2016-12-25 MED ORDER — LIDOCAINE HCL (PF) 1 % IJ SOLN
INTRAMUSCULAR | Status: AC
  Filled 2016-12-25: qty 30

## 2016-12-25 MED ORDER — MIDAZOLAM HCL 2 MG/2ML IJ SOLN
INTRAMUSCULAR | Status: AC | PRN
  Administered 2016-12-25: 1 mg via INTRAVENOUS
  Administered 2016-12-25 (×2): 0.5 mg via INTRAVENOUS

## 2016-12-25 MED ORDER — FENTANYL CITRATE (PF) 100 MCG/2ML IJ SOLN
INTRAMUSCULAR | Status: AC
  Filled 2016-12-25: qty 2

## 2016-12-25 MED ORDER — FENTANYL CITRATE (PF) 100 MCG/2ML IJ SOLN
INTRAMUSCULAR | Status: AC | PRN
  Administered 2016-12-25: 50 ug via INTRAVENOUS
  Administered 2016-12-25 (×2): 25 ug via INTRAVENOUS

## 2016-12-25 MED ORDER — HEPARIN SOD (PORK) LOCK FLUSH 100 UNIT/ML IV SOLN
INTRAVENOUS | Status: AC
  Administered 2016-12-25: 10:00:00
  Filled 2016-12-25: qty 5

## 2016-12-25 MED ORDER — MIDAZOLAM HCL 2 MG/2ML IJ SOLN
INTRAMUSCULAR | Status: AC
  Filled 2016-12-25: qty 2

## 2016-12-25 MED ORDER — CEFAZOLIN SODIUM-DEXTROSE 2-4 GM/100ML-% IV SOLN
INTRAVENOUS | Status: AC
  Filled 2016-12-25: qty 100

## 2016-12-25 MED ORDER — SODIUM CHLORIDE 0.9 % IV SOLN: INTRAVENOUS | Status: DC

## 2016-12-25 MED ORDER — LIDOCAINE HCL (PF) 1 % IJ SOLN
INTRAMUSCULAR | Status: AC | PRN
  Administered 2016-12-25 (×2): 5 mL

## 2016-12-25 MED ORDER — CEFAZOLIN SODIUM-DEXTROSE 2-4 GM/100ML-% IV SOLN
2.0000 g | INTRAVENOUS | Status: AC
  Administered 2016-12-25: 2 g via INTRAVENOUS

## 2016-12-25 NOTE — Discharge Instructions (Addendum)
Moderate Conscious Sedation, Adult, Care After °These instructions provide you with information about caring for yourself after your procedure. Your health care provider may also give you more specific instructions. Your treatment has been planned according to current medical practices, but problems sometimes occur. Call your health care provider if you have any problems or questions after your procedure. °What can I expect after the procedure? °After your procedure, it is common: °· To feel sleepy for several hours. °· To feel clumsy and have poor balance for several hours. °· To have poor judgment for several hours. °· To vomit if you eat too soon. ° °Follow these instructions at home: °For at least 24 hours after the procedure: ° °· Do not: °? Participate in activities where you could fall or become injured. °? Drive. °? Use heavy machinery. °? Drink alcohol. °? Take sleeping pills or medicines that cause drowsiness. °? Make important decisions or sign legal documents. °? Take care of children on your own. °· Rest. °Eating and drinking °· Follow the diet recommended by your health care provider. °· If you vomit: °? Drink water, juice, or soup when you can drink without vomiting. °? Make sure you have little or no nausea before eating solid foods. °General instructions °· Have a responsible adult stay with you until you are awake and alert. °· Take over-the-counter and prescription medicines only as told by your health care provider. °· If you smoke, do not smoke without supervision. °· Keep all follow-up visits as told by your health care provider. This is important. °Contact a health care provider if: °· You keep feeling nauseous or you keep vomiting. °· You feel light-headed. °· You develop a rash. °· You have a fever. °Get help right away if: °· You have trouble breathing. °This information is not intended to replace advice given to you by your health care provider. Make sure you discuss any questions you have  with your health care provider. °Document Released: 03/17/2013 Document Revised: 10/30/2015 Document Reviewed: 09/16/2015 °Elsevier Interactive Patient Education © 2018 [employer] °Implanted Port Home Guide °An implanted port is a type of central line that is placed under the skin. Central lines are used to provide IV access when treatment or nutrition needs to be given through a person’s veins. Implanted ports are used for long-term IV access. An implanted port may be placed because: °· You need IV medicine that would be irritating to the small veins in your hands or arms. °· You need long-term IV medicines, such as antibiotics. °· You need IV nutrition for a long period. °· You need frequent blood draws for lab tests. °· You need dialysis. ° °Implanted ports are usually placed in the chest area, but they can also be placed in the upper arm, the abdomen, or the leg. An implanted port has two main parts: °· Reservoir. The reservoir is round and will appear as a small, raised area under your skin. The reservoir is the part where a needle is inserted to give medicines or draw blood. °· Catheter. The catheter is a thin, flexible tube that extends from the reservoir. The catheter is placed into a large vein. Medicine that is inserted into the reservoir goes into the catheter and then into the vein. ° °How will I care for my incision site? °Do not get the incision site wet. Bathe or shower as directed by your health care provider. °How is my port accessed? °Special steps must be taken to access the port: °·   Before the port is accessed, a numbing cream can be placed on the skin. This helps numb the skin over the port site.  Your health care provider uses a sterile technique to access the port. ? Your health care provider must put on a mask and sterile gloves. ? The skin over your port is cleaned carefully with an antiseptic and allowed to dry. ? The port is gently pinched between sterile gloves, and a needle is  inserted into the port.  Only "non-coring" port needles should be used to access the port. Once the port is accessed, a blood return should be checked. This helps ensure that the port is in the vein and is not clogged.  If your port needs to remain accessed for a constant infusion, a clear (transparent) bandage will be placed over the needle site. The bandage and needle will need to be changed every week, or as directed by your health care provider.  Keep the bandage covering the needle clean and dry. Do not get it wet. Follow your health care providers instructions on how to take a shower or bath while the port is accessed.  If your port does not need to stay accessed, no bandage is needed over the port.  What is flushing? Flushing helps keep the port from getting clogged. Follow your health care providers instructions on how and when to flush the port. Ports are usually flushed with saline solution or a medicine called heparin. The need for flushing will depend on how the port is used.  If the port is used for intermittent medicines or blood draws, the port will need to be flushed: ? After medicines have been given. ? After blood has been drawn. ? As part of routine maintenance.  If a constant infusion is running, the port may not need to be flushed.  How long will my port stay implanted? The port can stay in for as long as your health care provider thinks it is needed. When it is time for the port to come out, surgery will be done to remove it. The procedure is similar to the one performed when the port was put in. When should I seek immediate medical care? When you have an implanted port, you should seek immediate medical care if:  You notice a bad smell coming from the incision site.  You have swelling, redness, or drainage at the incision site.  You have more swelling or pain at the port site or the surrounding area.  You have a fever that is not controlled with  medicine.  This information is not intended to replace advice given to you by your health care provider. Make sure you discuss any questions you have with your health care provider. Document Released: 05/27/2005 Document Revised: 11/02/2015 Document Reviewed: 02/01/2013 Elsevier Interactive Patient Education  2017 [place]. Moderate Conscious Sedation, Adult, Care After These instructions provide you with information about caring for yourself after your procedure. Your health care provider may also give you more specific instructions. Your treatment has been planned according to current medical practices, but problems sometimes occur. Call your health care provider if you have any problems or questions after your procedure. What can I expect after the procedure? After your procedure, it is common:  To feel sleepy for several hours.  To feel clumsy and have poor balance for several hours.  To have poor judgment for several hours.  To vomit if you eat too soon.  Follow these instructions at home: For  at least 24 hours after the procedure:   Do not: ? Participate in activities where you could fall or become injured. ? Drive. ? Use heavy machinery. ? Drink alcohol. ? Take sleeping pills or medicines that cause drowsiness. ? Make important decisions or sign legal documents. ? Take care of children on your own.  Rest. Eating and drinking  Follow the diet recommended by your health care provider.  If you vomit: ? Drink water, juice, or soup when you can drink without vomiting. ? Make sure you have little or no nausea before eating solid foods. General instructions  Have a responsible adult stay with you until you are awake and alert.  Take over-the-counter and prescription medicines only as told by your health care provider.  If you smoke, do not smoke without supervision.  Keep all follow-up visits as told by your health care provider. This is important. Contact a  health care provider if:  You keep feeling nauseous or you keep vomiting.  You feel light-headed.  You develop a rash.  You have a fever. Get help right away if:  You have trouble breathing. This information is not intended to replace advice given to you by your health care provider. Make sure you discuss any questions you have with your health care provider. Document Released: 03/17/2013 Document Revised: 10/30/2015 Document Reviewed: 09/16/2015 Elsevier Interactive Patient Education  [employer].

## 2016-12-25 NOTE — Procedures (Signed)
Ovarian ca  S/p RT IJ POWER PORT  Tip svcra No comp Stable Full report in PACS READY FOR USE

## 2016-12-25 NOTE — H&P (Signed)
Chief Complaint: ovarian cancer  Referring Physician:Dr. Heath Lark  Supervising Physician: Daryll Brod  Patient Status: [facility] - Out-pt  HPI: Denise Bonilla is a 83 y.o. female who has been found to have ovarian cancer.  She is followed by Dr. Alvy Bimler.  The patient does have significant dementia, but is otherwise pleasant.  The daughters have decided they would like for the patient to have neoadjuvant treatment and therefore, the patient presents today for placement of a port a cath.  The patient denies any chest pain, shortness of breath, or any other complaints.    Past Medical History:  Past Medical History:  Diagnosis Date  . Dementia   . High cholesterol   . Hypertension   . Ovarian cancer on right [facility] 12/23/2016    Past Surgical History:  Past Surgical History:  Procedure Laterality Date  . CYSTECTOMY      Family History:  Family History  Problem Relation Age of Onset  . Cancer Other   . Hyperlipidemia Other   . Hypertension Other   . Heart failure Mother   . Cancer Sister        breast ca    Social History:  reports that she has never smoked. She has never used smokeless tobacco. She reports that she does not drink alcohol or use drugs.  Allergies:  Allergies  Allergen Reactions  . Tetanus Toxoids Anaphylaxis  . Codeine Nausea And Vomiting    Medications: Medications reviewed in epic  Please HPI for pertinent positives, otherwise complete 10 system ROS negative.  Mallampati Score: MD Evaluation Airway: WNL Heart: WNL Abdomen: WNL Chest/ Lungs: WNL ASA  Classification: 3 Mallampati/Airway Score: Two  Physical Exam: BP 120/62   Pulse 62   Temp 97.7 F (36.5 C) (Oral)   Resp 16   Ht 5' (1.524 m)   Wt 155 lb (70.3 kg)   SpO2 98%   BMI 30.27 kg/m  Body mass index is 30.27 kg/m. General: pleasant, WD, WN white female who is laying in bed in NAD, but confused HEENT: head is normocephalic, atraumatic.  Sclera are noninjected.  PERRL.  Ears  and nose without any masses or lesions.  Mouth is pink and moist Heart: regular, rate, and rhythm.  Normal s1,s2. No obvious murmurs, gallops, or rubs noted.  Palpable radial and pedal pulses bilaterally Lungs: CTAB, no wheezes, rhonchi, or rales noted.  Respiratory effort nonlabored Abd: soft, NT, ND, +BS, no masses, hernias, or organomegaly Psych: Alert and oriented to person and place, but does not remember the conversation from 30 seconds prior.   Labs: Results for orders placed or performed during the hospital encounter of 12/25/16 (from the past 48 hour(s))  APTT     Status: None   Collection Time: 12/25/16  7:03 AM  Result Value Ref Range   aPTT 31 24 - 36 seconds  Basic metabolic panel     Status: Abnormal   Collection Time: 12/25/16  7:03 AM  Result Value Ref Range   Sodium 126 (L) 135 - 145 mmol/L   Potassium 3.8 3.5 - 5.1 mmol/L   Chloride 92 (L) 101 - 111 mmol/L   CO2 26 22 - 32 mmol/L   Glucose, Bld 110 (H) 65 - 99 mg/dL   BUN 11 6 - 20 mg/dL   Creatinine, Ser 0.83 0.44 - 1.00 mg/dL   Calcium 9.8 8.9 - 10.3 mg/dL   GFR calc non Af Amer >60 >60 mL/min   GFR calc Af Amer >  60 >60 mL/min    Comment: (NOTE) The eGFR has been calculated using the CKD EPI equation. This calculation has not been validated in all clinical situations. eGFR's persistently <60 mL/min signify possible Chronic Kidney Disease.    Anion gap 8 5 - 15  CBC     Status: None   Collection Time: 12/25/16  7:03 AM  Result Value Ref Range   WBC 6.1 4.0 - 10.5 K/uL   RBC 4.24 3.87 - 5.11 MIL/uL   Hemoglobin 13.2 12.0 - 15.0 g/dL   HCT 37.8 36.0 - 46.0 %   MCV 89.2 78.0 - 100.0 fL   MCH 31.1 26.0 - 34.0 pg   MCHC 34.9 30.0 - 36.0 g/dL   RDW 13.7 11.5 - 15.5 %   Platelets 325 150 - 400 K/uL  Protime-INR     Status: None   Collection Time: 12/25/16  7:03 AM  Result Value Ref Range   Prothrombin Time 13.5 11.4 - 15.2 seconds   INR 1.03     Imaging: No results found.  Assessment/Plan 1. Ovarian  cancer  We will plan to proceed with PAC placement at the request of the patient's daughters.  Her labs and vitals have been reviewed.  Risks and Benefits discussed with the patient including, but not limited to bleeding, infection, pneumothorax, or fibrin sheath development and need for additional procedures. All of the patient's questions were answered, patient is agreeable to proceed. Consent signed and in chart.  Thank you for this interesting consult.  I greatly enjoyed meeting BRISSA ASANTE and look forward to participating in their care.  A copy of this report was sent to the requesting provider on this date.  Electronically Signed: Henreitta Cea 19-Apr-202018, 8:12 AM   I spent a total of  30 Minutes   in face to face in clinical consultation, greater than 50% of which was counseling/coordinating care for ovarian cancer

## 2016-12-25 NOTE — Telephone Encounter (Signed)
Called daughter and told her labs will be added to 7-25 visit, due to low sodium.

## 2016-12-25 NOTE — Progress Notes (Signed)
Patient discharged with her daughters to home in stable condition with no complaints.

## 2016-12-30 ENCOUNTER — Other Ambulatory Visit: Payer: Medicare Other

## 2016-12-30 ENCOUNTER — Ambulatory Visit (HOSPITAL_BASED_OUTPATIENT_CLINIC_OR_DEPARTMENT_OTHER): Payer: Medicare Other | Admitting: Hematology and Oncology

## 2016-12-30 ENCOUNTER — Telehealth: Payer: Self-pay | Admitting: Hematology and Oncology

## 2016-12-30 VITALS — BP 151/70 | HR 65 | Resp 18 | Ht 60.0 in | Wt 156.6 lb

## 2016-12-30 DIAGNOSIS — J910 Malignant pleural effusion: Secondary | ICD-10-CM | POA: Diagnosis not present

## 2016-12-30 DIAGNOSIS — C561 Malignant neoplasm of right ovary: Secondary | ICD-10-CM | POA: Diagnosis not present

## 2016-12-30 DIAGNOSIS — K5909 Other constipation: Secondary | ICD-10-CM | POA: Diagnosis not present

## 2016-12-30 MED ORDER — DEXAMETHASONE 4 MG PO TABS: ORAL_TABLET | ORAL | 1 refills | Status: DC

## 2016-12-30 MED ORDER — LIDOCAINE-PRILOCAINE 2.5-2.5 % EX CREA: TOPICAL_CREAM | CUTANEOUS | 3 refills | Status: AC

## 2016-12-30 MED ORDER — ONDANSETRON HCL 8 MG PO TABS: ORAL_TABLET | ORAL | 1 refills | Status: DC

## 2016-12-30 MED ORDER — PROCHLORPERAZINE MALEATE 10 MG PO TABS: 10.0000 mg | ORAL_TABLET | Freq: Four times a day (QID) | ORAL | 1 refills | Status: DC | PRN

## 2016-12-30 NOTE — Telephone Encounter (Signed)
Called to confirm appt for genetics with patient - patient is aware that appt can not be done in the treatment area and will leave on 7/30

## 2016-12-30 NOTE — Telephone Encounter (Signed)
Scheduled appt per 7/23 los - Gave patient AVS and calender per los. - unable to schedule Genetics appt on the same day - will follow up with patient .

## 2016-12-31 ENCOUNTER — Other Ambulatory Visit: Payer: Self-pay | Admitting: *Deleted

## 2016-12-31 ENCOUNTER — Encounter: Payer: Self-pay | Admitting: Hematology and Oncology

## 2016-12-31 DIAGNOSIS — C561 Malignant neoplasm of right ovary: Secondary | ICD-10-CM

## 2016-12-31 MED ORDER — ONDANSETRON HCL 8 MG PO TABS: ORAL_TABLET | ORAL | 1 refills | Status: DC

## 2016-12-31 NOTE — Assessment & Plan Note (Signed)
We discussed the importance of aggressive laxative therapy 

## 2016-12-31 NOTE — Assessment & Plan Note (Signed)
She has persistent left sided pleural effusion but not symptomatic with this. She had diagnostic and therapeutic thoracentesis recently.  Continue close observation

## 2016-12-31 NOTE — Assessment & Plan Note (Signed)
We reviewed the NCCN guidelines We discussed the role of neoadjuvant chemotherapy. The intent is of curative intent.  We discussed some of the risks, benefits, side-effects of carboplatin & Taxol. Treatment is intravenous, every 3 weeks x 6 cycles  Some of the short term side-effects included, though not limited to, including weight loss, life threatening infections, risk of allergic reactions, need for transfusions of blood products, nausea, vomiting, change in bowel habits, loss of hair, admission to hospital for various reasons, and risks of death.   Long term side-effects are also discussed including risks of infertility, permanent damage to nerve function, hearing loss, chronic fatigue, kidney damage with possibility needing hemodialysis, and rare secondary malignancy including bone marrow disorders.  The patient is aware that the response rates discussed earlier is not guaranteed.  After a long discussion, patient made an informed decision to proceed with the prescribed plan of care.   Patient education material was dispensed. We discussed premedication with dexamethasone before chemotherapy. Given her age, she will be covered with Neulasta injection. She also have the option of using onpro

## 2016-12-31 NOTE — Progress Notes (Signed)
[facility] OFFICE PROGRESS NOTE  Patient Care Team: Monico Blitz, MD as PCP - General (Internal Medicine)  SUMMARY OF ONCOLOGIC HISTORY:   Ovarian cancer on right [facility])   12/07/2016 Imaging    Ct angiogram:  No evidence of pulmonary embolus.  Large left and small right pleural effusions. Complete collapse of the left lower lobe, partial collapse of the lingula and milder atelectasis of the left upper lobe, likely due to compressive atelectasis. The collapsed lung parenchyma is poorly evaluated, but no obvious pulmonary masses are seen.  Interstitial pulmonary edema.  Small volume abdominal ascites.  Heterogeneous appearance of the thyroid gland, with right sided nodules.  Aortic Atherosclerosis (ICD10-I70.0).      12/07/2016 - 12/13/2016 Hospital Admission    She was admitted for generalized malaise, abnormal blood work, large bilateral pleural effusion and possible right ovarian cancer      12/08/2016 Imaging    LV EF: 65% -   70%      12/08/2016 Pathology Results    PLEURAL FLUID, LEFT (SPECIMEN 1 OF 1, COLLECTED ON 12/08/16): - MALIGNANT CELLS CONSISTENT WITH METASTATIC ADENOCARCINOMA, SEE COMMENT      12/08/2016 Procedure    Successful ultrasound guided left thoracentesis yielding 1.6 L of pleural fluid. The procedure was terminated early secondary to significant coughing.      12/12/2016 Imaging    CT scan of abdomen and pelvis 1. Septated right adnexal cyst measuring 8.5 x 6.5 cm, concerning for ovarian malignancy in this setting. Small volume complex intra-abdominal ascites which tracks in the pericolic gutters and anterior omentum. No definite soft tissue omental caking. 2. Endometrial thickening versus fluid in the endometrial canal, nonspecific. Rounded 3.9 cm lesion in the uterine fundus may be a fibroid, however would be unusual for patient's age. Endometrial/ uterine neoplasm is considered. 3. Nonspecific ill-defined low-density lesion in the right lobe  of the liver. Probable hepatic hemangioma at the dome. 4. Further evaluation of the GYN structures could be performed with ultrasound. MRI may provide further detail if needed for potential surgical planning. 5. Aortic atherosclerosis      12/12/2016 Imaging    CXR 1. Progressive left base atelectasis and infiltrate. Small left pleural effusion again noted. 2.  Cardiomegaly, no evidence of pulmonary venous congestion.      12/13/2016 Tumor Marker    Patient's tumor was tested for the following markers: CA-125. Results of the tumor marker test revealed 1310.      08/15/2016 Procedure    Ultrasound and fluoroscopically guided right internal jugular single lumen power port catheter insertion. Tip in the SVC/RA junction. Catheter ready for use.       INTERVAL HISTORY: Please see below for problem oriented charting. She returns for further follow-up The patient denies pain There were no reported significant shortness of breath recently Constipation is under control with laxatives.  REVIEW OF SYSTEMS:   Constitutional: Denies fevers, chills or abnormal weight loss Eyes: Denies blurriness of vision Ears, nose, mouth, throat, and face: Denies mucositis or sore throat Respiratory: Denies cough, dyspnea or wheezes Cardiovascular: Denies palpitation, chest discomfort or lower extremity swelling Gastrointestinal:  Denies nausea, heartburn or change in bowel habits Skin: Denies abnormal skin rashes Lymphatics: Denies new lymphadenopathy or easy bruising Neurological:Denies numbness, tingling or new weaknesses Behavioral/Psych: Mood is stable, no new changes  All other systems were reviewed with the patient and are negative.  I have reviewed the past medical history, past surgical history, social history and family history with the  patient and they are unchanged from previous note.  ALLERGIES:  is allergic to tetanus toxoids and codeine.  MEDICATIONS:  Current Outpatient Prescriptions   Medication Sig Dispense Refill  . aspirin EC 81 MG tablet Take 81 mg by mouth daily.    . bisoprolol (ZEBETA) 10 MG tablet Take 10 mg by mouth daily.    . Cholecalciferol (VITAMIN D) 2000 units tablet Take 2,000 Units by mouth daily.    . furosemide (LASIX) 20 MG tablet Take 1 tablet (20 mg total) by mouth daily. 30 tablet 0  . Multiple Vitamins-Minerals (MULTI FOR HER 50+) TABS Take 1 tablet by mouth daily.    . Omega-3 Fatty Acids (FISH OIL) 500 MG CAPS Take 1 capsule by mouth daily.    . simvastatin (ZOCOR) 20 MG tablet Take 20 mg by mouth at bedtime.     Marland Kitchen dexamethasone (DECADRON) 4 MG tablet 5 tabs the night before and 5 tabs in the morning of chemo, take with food, every 3 weeks 30 tablet 1  . lidocaine-prilocaine (EMLA) cream Apply to affected area once 30 g 3  . ondansetron (ZOFRAN) 8 MG tablet Take 3. 30 tablet 1  . prochlorperazine (COMPAZINE) 10 MG tablet Take 1 tablet (10 mg total) by mouth every 6 (six) hours as needed (Nausea or vomiting). 30 tablet 1   No current facility-administered medications for this visit.     PHYSICAL EXAMINATION: ECOG PERFORMANCE STATUS: 1 - Symptomatic but completely ambulatory  Vitals:   12/30/16 1147  BP: (!) 151/70  Pulse: 65  Resp: 18   Filed Weights   12/30/16 1147  Weight: 156 lb 9.6 oz (71 kg)    GENERAL:alert, no distress and comfortable SKIN: skin color, texture, turgor are normal, no rashes or significant lesions EYES: normal, Conjunctiva are pink and non-injected, sclera clear OROPHARYNX:no exudate, no erythema and lips, buccal mucosa, and tongue normal  NECK: supple, thyroid normal size, non-tender, without nodularity LYMPH:  no palpable lymphadenopathy in the cervical, axillary or inguinal LUNGS: Noted reduced breath sounds on the left lung base without increased respiratory effort  HEART: regular rate & rhythm and no murmurs and no lower extremity edema ABDOMEN:abdomen soft, non-tender and normal bowel  sounds Musculoskeletal:no cyanosis of digits and no clubbing  NEURO: alert & oriented x 3 with fluent speech, no focal motor/sensory deficits  LABORATORY DATA:  I have reviewed the data as listed    Component Value Date/Time   NA 126 (L) 22-Oct-202018 0703   K 3.8 22-Oct-202018 0703   CL 92 (L) 22-Oct-202018 0703   CO2 26 22-Oct-202018 0703   GLUCOSE 110 (H) 22-Oct-202018 0703   BUN 11 22-Oct-202018 0703   CREATININE 0.83 22-Oct-202018 0703   CALCIUM 9.8 22-Oct-202018 0703   PROT 6.3 (L) 12/08/2016 1455   ALBUMIN 2.7 (L) 12/11/2016 0326   AST 22 12/08/2016 1455   ALT 18 12/08/2016 1455   ALKPHOS 66 12/08/2016 1455   BILITOT 0.8 12/08/2016 1455   GFRNONAA >60 22-Oct-202018 0703   GFRAA >60 22-Oct-202018 0703    No results found for: SPEP, UPEP  Lab Results  Component Value Date   WBC 6.1 22-Oct-202018   NEUTROABS 4.3 12/01/2016   HGB 13.2 22-Oct-202018   HCT 37.8 22-Oct-202018   MCV 89.2 22-Oct-202018   PLT 325 22-Oct-202018      Chemistry      Component Value Date/Time   NA 126 (L) 22-Oct-202018 0703   K 3.8 22-Oct-202018 0703   CL 92 (L) 22-Oct-202018 0703  CO2 26 2020/07/1616 0703   BUN 11 2020/07/1616 0703   CREATININE 0.83 2020/07/1616 0703      Component Value Date/Time   CALCIUM 9.8 2020/07/1616 0703   ALKPHOS 66 12/08/2016 1455   AST 22 12/08/2016 1455   ALT 18 12/08/2016 1455   BILITOT 0.8 12/08/2016 1455       RADIOGRAPHIC STUDIES: I have personally reviewed the radiological images as listed and agreed with the findings in the report. Dg Chest 1 View  Result Date: 12/08/2016 CLINICAL DATA:  Post thoracentesis EXAM: CHEST 1 VIEW COMPARISON:  12/08/2016 FINDINGS: Moderate-sized left pleural effusion, decreased following thoracentesis. No pneumothorax. Improving aeration in the left lung with continued left lower lobe atelectasis or infiltrate. No confluent opacity on the right. IMPRESSION: Decreasing left effusion following thoracentesis.  No pneumothorax. Electronically Signed   By: Rolm Baptise M.D.   On:  12/08/2016 12:03   Dg Chest 2 View  Result Date: 12/07/2016 CLINICAL DATA:  Dry cough for 1 week. EXAM: CHEST  2 VIEW COMPARISON:  12/01/2016 FINDINGS: The cardiomediastinal silhouette is obscured by a large left pleural effusion. Calcific atherosclerotic disease of the aorta. No evidence of pneumothorax. The left pleural effusion has enlarged when compared to the prior radiograph, occupying approximately 2/3 of the left hemithorax. Osteoarthritic changes of the spine and kyphotic deformity, with mild chronic appearing compression deformity of multiple lower thoracic and upper lumbar vertebral bodies. Soft tissues are grossly normal. IMPRESSION: Enlarged left pleural effusion. The left lung parenchyma is mostly obscured by it. Small right pleural effusion.  Right lung clear. Calcific atherosclerotic disease of the aorta. Electronically Signed   By: Fidela Salisbury M.D.   On: 12/07/2016 12:59   Dg Chest 2 View  Result Date: 12/01/2016 CLINICAL DATA:  Weakness and not eating well.  Possible fever. EXAM: CHEST  2 VIEW COMPARISON:  None. FINDINGS: Lungs are adequately inflated with opacification over the lower 60% of the left thorax likely large effusion with associated atelectasis. Cannot exclude infection over the left mid to lower lung. Mild cardiomegaly. Minimal calcification over the thoracic aorta. Degenerative change of the spine. IMPRESSION: Moderate opacification over the left mid to lower lung likely large effusion with atelectasis. Cannot exclude infection in the left mid to lower lung. Recommend follow-up to resolution. Cardiomegaly. Electronically Signed   By: Marin Olp M.D.   On: 12/01/2016 13:16   Ct Angio Chest Pe W/cm &/or Wo Cm  Result Date: 12/07/2016 CLINICAL DATA:  Weakness.  Pleural effusions. EXAM: CT ANGIOGRAPHY CHEST WITH CONTRAST TECHNIQUE: Multidetector CT imaging of the chest was performed using the standard protocol during bolus administration of intravenous contrast.  Multiplanar CT image reconstructions and MIPs were obtained to evaluate the vascular anatomy. CONTRAST:  100 cc Isovue 370 intravenously. COMPARISON:  Chest radiograph 12/07/2016 FINDINGS: Cardiovascular: Satisfactory opacification of the pulmonary arteries to the segmental level. No evidence of pulmonary embolism. Normal heart size. No pericardial effusion. Aortic atherosclerosis. Mediastinum/Nodes: No enlarged mediastinal, hilar, or axillary lymph nodes. The trachea, and esophagus demonstrate no significant findings. Heterogeneous appearance of the thyroid gland. Lungs/Pleura: The bronchial tree to the level of the lobar branches is intact. There is large left and small right pleural effusion. Evaluation of the lung parenchyma as suboptimal due to motion artifact, but no pulmonary masses are seen within the aerated lung. There is significant compressive atelectasis of the left lung, secondary to the large pleural effusion with complete collapse of the left lower lobe, and partial collapse of the lingula. There  is interstitial pulmonary edema. Upper Abdomen: Small volume abdominal ascites. Musculoskeletal: No chest wall abnormality. No acute or significant osseous findings. Review of the MIP images confirms the above findings. IMPRESSION: No evidence of pulmonary embolus. Large left and small right pleural effusions. Complete collapse of the left lower lobe, partial collapse of the lingula and milder atelectasis of the left upper lobe, likely due to compressive atelectasis. The collapsed lung parenchyma is poorly evaluated, but no obvious pulmonary masses are seen. Interstitial pulmonary edema. Small volume abdominal ascites. Heterogeneous appearance of the thyroid gland, with right sided nodules. Aortic Atherosclerosis (ICD10-I70.0). Electronically Signed   By: Fidela Salisbury M.D.   On: 12/07/2016 16:40   Ct Abdomen Pelvis W Contrast  Result Date: 12/12/2016 CLINICAL DATA:  Left pleural effusion with  cytology suspicious for malignancy. Concern for intra- abdominal malignancy/metastasis. EXAM: CT ABDOMEN AND PELVIS WITH CONTRAST TECHNIQUE: Multidetector CT imaging of the abdomen and pelvis was performed using the standard protocol following bolus administration of intravenous contrast. CONTRAST:  145mL ISOVUE-300 IOPAMIDOL (ISOVUE-300) INJECTION 61% COMPARISON:  No prior abdominal imaging.  Chest CTA 12/07/2016 FINDINGS: Lower chest: Left pleural effusion has decreased in size from prior exam post thoracentesis. Persistent adjacent left lung base atelectasis. Tiny right pleural effusion and adjacent atelectasis. Hepatobiliary: Nonspecific ill-defined low density in the inferior right lobe of the liver image 30 series 3 measures approximately 18 mm. Subcapsular lesion measures 14 mm with peripheral enhancement, may be a hemangioma, incompletely characterized. Pancreas: No ductal dilatation or inflammation. Spleen: Normal in size without focal abnormality. Adrenals/Urinary Tract: Normal adrenal glands, no adrenal nodule. No hydronephrosis or perinephric edema. 9 mm low-density lesion in the posterior mid right kidney is too small to characterize. Small subcentimeter low-density lesions in the lower left kidney, also too small to characterize. Symmetric excretion on delayed phase imaging. Urinary bladder is physiologically distended. Stomach/Bowel: Stomach is nondistended. No bowel obstruction or inflammation. Moderate stool burden throughout the colon with stool distending the rectum. Minimal sigmoid colonic diverticulosis, no diverticulitis. No evidence of colonic mass. Vascular/Lymphatic: Abdominal aortic atherosclerosis. Retroaortic left renal vein. No retroperitoneal adenopathy. No mesenteric adenopathy. No definite pelvic adenopathy. Reproductive: Large right adnexal cystic structure measures 8.5 x 6.5 cm. There internal septations. Left ovary not well-defined, no gross left adnexal mass. Uterus is abnormal in  appearance for age. Endometrial thickening or fluid in the endometrial canal. Posterior heterogeneous lesion in the uterus may be a fibroid, unusual for age. Other: Small volume of upper abdominal ascites. Fluid is mildly complex. Minimal fluid tracks in both pericolic gutters in the mesenteries. There is no definite omental soft tissue density, however mild stranding and ascites is seen. Musculoskeletal: No blastic osseous lesions. No evidence of destructive lytic lesion. Bones are under mineralized which limits assessment. Scoliosis and degenerative change in the lumbar spine. IMPRESSION: 1. Septated right adnexal cyst measuring 8.5 x 6.5 cm, concerning for ovarian malignancy in this setting. Small volume complex intra-abdominal ascites which tracks in the pericolic gutters and anterior omentum. No definite soft tissue omental caking. 2. Endometrial thickening versus fluid in the endometrial canal, nonspecific. Rounded 3.9 cm lesion in the uterine fundus may be a fibroid, however would be unusual for patient's age. Endometrial/ uterine neoplasm is considered. 3. Nonspecific ill-defined low-density lesion in the right lobe of the liver. Probable hepatic hemangioma at the dome. 4. Further evaluation of the GYN structures could be performed with ultrasound. MRI may provide further detail if needed for potential surgical planning. 5. Aortic atherosclerosis. Electronically Signed  By: Jeb Levering M.D.   On: 12/12/2016 23:42   Ir US Guide Vasc Access Right  Result Date: November 14, 202018 CLINICAL DATA:  Ovarian carcinoma, access for chemotherapy EXAM: RIGHT INTERNAL JUGULAR SINGLE LUMEN POWER PORT CATHETER INSERTION Date:  November 14, 202018November 14, 202018 9:52 am Radiologist:  M. Daryll Brod, MD Guidance:  Ultrasound and fluoroscopic MEDICATIONS: Ancef 2 g; The antibiotic was administered within an appropriate time interval prior to skin puncture. ANESTHESIA/SEDATION: Versed 2.0 mg IV; Fentanyl 100 mcg IV; Moderate Sedation Time:   36 minutes The patient was continuously monitored during the procedure by the interventional radiology nurse under my direct supervision. FLUOROSCOPY TIME:  42 seconds (2 mGy) COMPLICATIONS: None immediate. CONTRAST:  None. PROCEDURE: Informed consent was obtained from the patient following explanation of the procedure, risks, benefits and alternatives. The patient understands, agrees and consents for the procedure. All questions were addressed. A time out was performed. Maximal barrier sterile technique utilized including caps, mask, sterile gowns, sterile gloves, large sterile drape, hand hygiene, and 2% chlorhexidine scrub. Under sterile conditions and local anesthesia, right internal jugular micropuncture venous access was performed. Access was performed with ultrasound. Images were obtained for documentation. A guide wire was inserted followed by a transitional dilator. This allowed insertion of a guide wire and catheter into the IVC. Measurements were obtained from the SVC / RA junction back to the right IJ venotomy site. In the right infraclavicular chest, a subcutaneous pocket was created over the second anterior rib. This was done under sterile conditions and local anesthesia. 1% lidocaine with epinephrine was utilized for this. A 2.5 cm incision was made in the skin. Blunt dissection was performed to create a subcutaneous pocket over the right pectoralis major muscle. The pocket was flushed with saline vigorously. There was adequate hemostasis. The port catheter was assembled and checked for leakage. The port catheter was secured in the pocket with two retention sutures. The tubing was tunneled subcutaneously to the right venotomy site and inserted into the SVC/RA junction through a valved peel-away sheath. Position was confirmed with fluoroscopy. Images were obtained for documentation. The patient tolerated the procedure well. No immediate complications. Incisions were closed in a two layer fashion with  4 - 0 Vicryl suture. Dermabond was applied to the skin. The port catheter was accessed, blood was aspirated followed by saline and heparin flushes. Needle was removed. A dry sterile dressing was applied. IMPRESSION: Ultrasound and fluoroscopically guided right internal jugular single lumen power port catheter insertion. Tip in the SVC/RA junction. Catheter ready for use. Electronically Signed   By: Jerilynn Mages.  Shick M.D.   On: 0November 14, 202018 10:03   Dg Chest Port 1 View  Result Date: 12/12/2016 CLINICAL DATA:  Follow-up left pleural effusion. EXAM: PORTABLE CHEST 1 VIEW COMPARISON:  12/08/2016. FINDINGS: Cardiomegaly normal pulmonary vascularity. Progressive left base atelectasis and infiltrate. Left base infiltrate cannot be excluded. Small left pleural effusion is unchanged. IMPRESSION: 1. Progressive left base atelectasis and infiltrate. Small left pleural effusion again noted. 2.  Cardiomegaly, no evidence of pulmonary venous congestion. Electronically Signed   By: Marcello Moores  Register   On: 12/12/2016 11:59   Dg Chest Port 1 View  Result Date: 12/08/2016 CLINICAL DATA:  Left pleural effusion EXAM: PORTABLE CHEST 1 VIEW COMPARISON:  12/07/2016 FINDINGS: Large left pleural effusion with left lower lobe atelectasis, unchanged. Probable cardiomegaly. Right lung is clear. No change since prior study. IMPRESSION: Stable large left pleural effusion with left lung atelectasis. Electronically Signed   By: Rolm Baptise M.D.   On:  12/08/2016 11:10   US Thyroid  Result Date: 12/09/2016 CLINICAL DATA:  Incidental on CT. Thyroid nodules incidentally seen on chest CT. EXAM: THYROID ULTRASOUND TECHNIQUE: Ultrasound examination of the thyroid gland and adjacent soft tissues was performed. COMPARISON:  Chest CT -12/07/2016 FINDINGS: Parenchymal Echotexture: Mildly heterogenous Isthmus: Normal in size measuring 0.2 cm in diameter Right lobe: Normal in size measuring 4.1 x 2.1 x 1.6 cm Left lobe: Normal in size measuring 3.4 x 1.5 x 1.1  cm _________________________________________________________ Estimated total number of nodules >/= 1 cm: 2 Number of spongiform nodules >/=  2 cm not described below (TR1): 0 Number of mixed cystic and solid nodules >/= 1.5 cm not described below (TR2): 0 _________________________________________________________ There is an approximately 1.5 x 0.7 x 1.2 cm anechoic cyst within the superior pole of the right lobe of the thyroid, which correlates with the nodule seen on preceding chest CT, which contains an eccentric echogenic calcification with ring down artifact suggestive of colloid. This nodule does not meet imaging criteria to recommend percutaneous sampling or dedicated follow-up There are several additional smaller punctate (sub 9 mm) cysts, several of which also contain benign colloid, and none of which meet imaging criteria to recommend percutaneous sampling or dedicated follow-up. There is a punctate (approximately 0.8 x 0.5 x 0.7 cm) anechoic cyst within the superior pole the left lobe of the thyroid as well as a smaller (approximately 0.3 cm) anechoic cyst within the inferior pole the left lobe of the thyroid, both of which contain eccentric echogenic calcification with ring down artifact suggestive of colloid. Neither nodule meets imaging criteria to recommend percutaneous sampling or dedicated follow-up. IMPRESSION: 1. Findings suggestive of multinodular goiter. 2. None of the discretely measured thyroid nodules/cysts, including the approximately 1.5 cm cyst within the right lobe of the thyroid which correlates with the nodule seen on preceding chest CT, meet imaging criteria to recommend percutaneous sampling or dedicated follow-up. The above is in keeping with the ACR TI-RADS recommendations - J Am Coll Radiol 2017;14:587-595. Electronically Signed   By: Sandi Mariscal M.D.   On: 12/09/2016 12:43   Ir Fluoro Guide Port Insertion Right  Result Date: 06/30/2016 CLINICAL DATA:  Ovarian carcinoma, access  for chemotherapy EXAM: RIGHT INTERNAL JUGULAR SINGLE LUMEN POWER PORT CATHETER INSERTION Date:  01/21/201801/21/2018 9:52 am Radiologist:  M. Daryll Brod, MD Guidance:  Ultrasound and fluoroscopic MEDICATIONS: Ancef 2 g; The antibiotic was administered within an appropriate time interval prior to skin puncture. ANESTHESIA/SEDATION: Versed 2.0 mg IV; Fentanyl 100 mcg IV; Moderate Sedation Time:  36 minutes The patient was continuously monitored during the procedure by the interventional radiology nurse under my direct supervision. FLUOROSCOPY TIME:  42 seconds (2 mGy) COMPLICATIONS: None immediate. CONTRAST:  None. PROCEDURE: Informed consent was obtained from the patient following explanation of the procedure, risks, benefits and alternatives. The patient understands, agrees and consents for the procedure. All questions were addressed. A time out was performed. Maximal barrier sterile technique utilized including caps, mask, sterile gowns, sterile gloves, large sterile drape, hand hygiene, and 2% chlorhexidine scrub. Under sterile conditions and local anesthesia, right internal jugular micropuncture venous access was performed. Access was performed with ultrasound. Images were obtained for documentation. A guide wire was inserted followed by a transitional dilator. This allowed insertion of a guide wire and catheter into the IVC. Measurements were obtained from the SVC / RA junction back to the right IJ venotomy site. In the right infraclavicular chest, a subcutaneous pocket was created over the second  anterior rib. This was done under sterile conditions and local anesthesia. 1% lidocaine with epinephrine was utilized for this. A 2.5 cm incision was made in the skin. Blunt dissection was performed to create a subcutaneous pocket over the right pectoralis major muscle. The pocket was flushed with saline vigorously. There was adequate hemostasis. The port catheter was assembled and checked for leakage. The port  catheter was secured in the pocket with two retention sutures. The tubing was tunneled subcutaneously to the right venotomy site and inserted into the SVC/RA junction through a valved peel-away sheath. Position was confirmed with fluoroscopy. Images were obtained for documentation. The patient tolerated the procedure well. No immediate complications. Incisions were closed in a two layer fashion with 4 - 0 Vicryl suture. Dermabond was applied to the skin. The port catheter was accessed, blood was aspirated followed by saline and heparin flushes. Needle was removed. A dry sterile dressing was applied. IMPRESSION: Ultrasound and fluoroscopically guided right internal jugular single lumen power port catheter insertion. Tip in the SVC/RA junction. Catheter ready for use. Electronically Signed   By: Jerilynn Mages.  Shick M.D.   On: 24-Mar-202018 10:03   US Thoracentesis Asp Pleural Space W/img Guide  Result Date: 12/10/2016 INDICATION: Large left pleural effusion noted on recent imaging. Request made for diagnostic and therapeutic thoracentesis. EXAM: ULTRASOUND GUIDED DIAGNOSTIC AND THERAPEUTIC THORACENTESIS MEDICATIONS: 1% lidocaine COMPLICATIONS: None immediate. PROCEDURE: An ultrasound guided thoracentesis was thoroughly discussed with the patient's daughter and questions answered. The benefits, risks, alternatives and complications were also discussed. The patient's daughter understands and wishes to proceed with the procedure. Written consent was obtained. Ultrasound was performed to localize and mark an adequate pocket of fluid in the left chest. The area was then prepped and draped in the normal sterile fashion. 1% Lidocaine was used for local anesthesia. Under ultrasound guidance a Safe-T-Centesis catheter was introduced. Thoracentesis was performed. The catheter was removed and a dressing applied. FINDINGS: A total of approximately 1.6 L of slightly dark, serous fluid was removed. Samples were sent to the laboratory as  requested by the clinical team. IMPRESSION: Successful ultrasound guided left thoracentesis yielding 1.6 L of pleural fluid. The procedure was terminated early secondary to significant coughing. Read by: Saverio Danker, PA-C Electronically Signed   By: Markus Daft M.D.   On: 12/08/2016 11:47    ASSESSMENT & PLAN:  Ovarian cancer on right [facility]) We reviewed the NCCN guidelines We discussed the role of neoadjuvant chemotherapy. The intent is of curative intent.  We discussed some of the risks, benefits, side-effects of carboplatin & Taxol. Treatment is intravenous, every 3 weeks x 6 cycles  Some of the short term side-effects included, though not limited to, including weight loss, life threatening infections, risk of allergic reactions, need for transfusions of blood products, nausea, vomiting, change in bowel habits, loss of hair, admission to hospital for various reasons, and risks of death.   Long term side-effects are also discussed including risks of infertility, permanent damage to nerve function, hearing loss, chronic fatigue, kidney damage with possibility needing hemodialysis, and rare secondary malignancy including bone marrow disorders.  The patient is aware that the response rates discussed earlier is not guaranteed.  After a long discussion, patient made an informed decision to proceed with the prescribed plan of care.   Patient education material was dispensed. We discussed premedication with dexamethasone before chemotherapy. Given her age, she will be covered with Neulasta injection. She also have the option of using onpro   Malignant pleural  effusion She has persistent left sided pleural effusion but not symptomatic with this. She had diagnostic and therapeutic thoracentesis recently.  Continue close observation  Chronic constipation We discussed the importance of aggressive laxative therapy.   Orders Placed This Encounter  Procedures  . CBC with Differential    Standing  Status:   Standing    Number of Occurrences:   20    Standing Expiration Date:   12/31/2017  . Comprehensive metabolic panel    Standing Status:   Standing    Number of Occurrences:   20    Standing Expiration Date:   12/31/2017   All questions were answered. The patient knows to call the clinic with any problems, questions or concerns. No barriers to learning was detected. I spent 25 minutes counseling the patient face to face. The total time spent in the appointment was 30 minutes and more than 50% was on counseling and review of test results     Heath Lark, MD 12/31/2016 6:42 AM

## 2017-01-01 ENCOUNTER — Other Ambulatory Visit: Payer: Self-pay | Admitting: Hematology and Oncology

## 2017-01-01 ENCOUNTER — Ambulatory Visit (HOSPITAL_BASED_OUTPATIENT_CLINIC_OR_DEPARTMENT_OTHER): Payer: Medicare Other

## 2017-01-01 ENCOUNTER — Other Ambulatory Visit (HOSPITAL_BASED_OUTPATIENT_CLINIC_OR_DEPARTMENT_OTHER): Payer: Medicare Other

## 2017-01-01 VITALS — BP 122/54 | HR 67 | Temp 98.3°F | Resp 17

## 2017-01-01 DIAGNOSIS — C561 Malignant neoplasm of right ovary: Secondary | ICD-10-CM

## 2017-01-01 DIAGNOSIS — Z5111 Encounter for antineoplastic chemotherapy: Secondary | ICD-10-CM

## 2017-01-01 LAB — CBC WITH DIFFERENTIAL/PLATELET
BASO%: 0 % (ref 0.0–2.0)
BASOS ABS: 0 10*3/uL (ref 0.0–0.1)
EOS ABS: 0 10*3/uL (ref 0.0–0.5)
EOS%: 0 % (ref 0.0–7.0)
HEMATOCRIT: 35.8 % (ref 34.8–46.6)
HEMOGLOBIN: 12.6 g/dL (ref 11.6–15.9)
LYMPH%: 13.6 % — ABNORMAL LOW (ref 14.0–49.7)
MCH: 32.1 pg (ref 25.1–34.0)
MCHC: 35.2 g/dL (ref 31.5–36.0)
MCV: 91.3 fL (ref 79.5–101.0)
MONO#: 0.1 10*3/uL (ref 0.1–0.9)
MONO%: 2 % (ref 0.0–14.0)
NEUT#: 4.7 10*3/uL (ref 1.5–6.5)
NEUT%: 84.4 % — ABNORMAL HIGH (ref 38.4–76.8)
PLATELETS: 289 10*3/uL (ref 145–400)
RBC: 3.92 10*6/uL (ref 3.70–5.45)
RDW: 13.9 % (ref 11.2–14.5)
WBC: 5.6 10*3/uL (ref 3.9–10.3)
lymph#: 0.8 10*3/uL — ABNORMAL LOW (ref 0.9–3.3)

## 2017-01-01 LAB — COMPREHENSIVE METABOLIC PANEL
ALBUMIN: 3.5 g/dL (ref 3.5–5.0)
ALK PHOS: 67 U/L (ref 40–150)
ALT: 17 U/L (ref 0–55)
ANION GAP: 12 meq/L — AB (ref 3–11)
AST: 19 U/L (ref 5–34)
BILIRUBIN TOTAL: 0.55 mg/dL (ref 0.20–1.20)
BUN: 14.7 mg/dL (ref 7.0–26.0)
CALCIUM: 9.8 mg/dL (ref 8.4–10.4)
CO2: 24 mEq/L (ref 22–29)
Chloride: 98 mEq/L (ref 98–109)
Creatinine: 0.8 mg/dL (ref 0.6–1.1)
EGFR: 66 mL/min/{1.73_m2} — AB (ref >90)
Glucose: 170 mg/dl — ABNORMAL HIGH (ref 70–140)
Potassium: 3.6 mEq/L (ref 3.5–5.1)
Sodium: 134 mEq/L — ABNORMAL LOW (ref 136–145)
TOTAL PROTEIN: 6.7 g/dL (ref 6.4–8.3)

## 2017-01-01 MED ORDER — DIPHENHYDRAMINE HCL 50 MG/ML IJ SOLN
50.0000 mg | Freq: Once | INTRAMUSCULAR | Status: AC
Start: 2017-01-01 — End: 2017-01-01
  Administered 2017-01-01: 50 mg via INTRAVENOUS

## 2017-01-01 MED ORDER — PEGFILGRASTIM 6 MG/0.6ML ~~LOC~~ PSKT
6.0000 mg | PREFILLED_SYRINGE | Freq: Once | SUBCUTANEOUS | Status: AC
  Administered 2017-01-01: 6 mg via SUBCUTANEOUS
  Filled 2017-01-01: qty 0.6

## 2017-01-01 MED ORDER — DIPHENHYDRAMINE HCL 50 MG/ML IJ SOLN
INTRAMUSCULAR | Status: AC
  Filled 2017-01-01: qty 1

## 2017-01-01 MED ORDER — SODIUM CHLORIDE 0.9 % IV SOLN
Freq: Once | INTRAVENOUS | Status: AC
  Administered 2017-01-01: 10:00:00 via INTRAVENOUS

## 2017-01-01 MED ORDER — PALONOSETRON HCL INJECTION 0.25 MG/5ML
INTRAVENOUS | Status: AC
  Filled 2017-01-01: qty 5

## 2017-01-01 MED ORDER — SODIUM CHLORIDE 0.9% FLUSH
10.0000 mL | INTRAVENOUS | Status: DC | PRN
  Administered 2017-01-01: 10 mL
  Filled 2017-01-01: qty 10

## 2017-01-01 MED ORDER — FAMOTIDINE IN NACL 20-0.9 MG/50ML-% IV SOLN
INTRAVENOUS | Status: AC
  Filled 2017-01-01: qty 50

## 2017-01-01 MED ORDER — PALONOSETRON HCL INJECTION 0.25 MG/5ML
0.2500 mg | Freq: Once | INTRAVENOUS | Status: AC
  Administered 2017-01-01: 0.25 mg via INTRAVENOUS

## 2017-01-01 MED ORDER — SODIUM CHLORIDE 0.9 % IV SOLN
Freq: Once | INTRAVENOUS | Status: AC
  Administered 2017-01-01: 11:00:00 via INTRAVENOUS
  Filled 2017-01-01: qty 5

## 2017-01-01 MED ORDER — SODIUM CHLORIDE 0.9 % IV SOLN
423.6000 mg | Freq: Once | INTRAVENOUS | Status: AC
  Administered 2017-01-01: 420 mg via INTRAVENOUS
  Filled 2017-01-01: qty 42

## 2017-01-01 MED ORDER — PACLITAXEL CHEMO INJECTION 300 MG/50ML
175.0000 mg/m2 | Freq: Once | INTRAVENOUS | Status: AC
  Administered 2017-01-01: 300 mg via INTRAVENOUS
  Filled 2017-01-01: qty 50

## 2017-01-01 MED ORDER — HEPARIN SOD (PORK) LOCK FLUSH 100 UNIT/ML IV SOLN
500.0000 [IU] | Freq: Once | INTRAVENOUS | Status: AC | PRN
  Administered 2017-01-01: 500 [IU]
  Filled 2017-01-01: qty 5

## 2017-01-01 MED ORDER — FAMOTIDINE IN NACL 20-0.9 MG/50ML-% IV SOLN
20.0000 mg | Freq: Once | INTRAVENOUS | Status: AC
  Administered 2017-01-01: 20 mg via INTRAVENOUS

## 2017-01-01 NOTE — Patient Instructions (Addendum)
[facility] Discharge Instructions for Patients Receiving Chemotherapy  Today you received the following chemotherapy agents: Taxol and Carboplatin.  To help prevent nausea and vomiting after your treatment, we encourage you to take your nausea medication as prescribed.   If you develop nausea and vomiting that is not controlled by your nausea medication, call the clinic.   BELOW ARE SYMPTOMS THAT SHOULD BE REPORTED IMMEDIATELY:  *FEVER GREATER THAN 100.5 F  *CHILLS WITH OR WITHOUT FEVER  NAUSEA AND VOMITING THAT IS NOT CONTROLLED WITH YOUR NAUSEA MEDICATION  *UNUSUAL SHORTNESS OF BREATH  *UNUSUAL BRUISING OR BLEEDING  TENDERNESS IN MOUTH AND THROAT WITH OR WITHOUT PRESENCE OF ULCERS  *URINARY PROBLEMS  *BOWEL PROBLEMS  UNUSUAL RASH Items with * indicate a potential emergency and should be followed up as soon as possible.  Feel free to call the clinic you have any questions or concerns. The clinic phone number is [phone].  Please show the [place] at check-in to the Emergency Department and triage nurse.  (Taxol) Paclitaxel injection What is this medicine? PACLITAXEL (PAK li TAX el) is a chemotherapy drug. It targets fast dividing cells, like cancer cells, and causes these cells to die. This medicine is used to treat ovarian cancer, breast cancer, and other cancers. This medicine may be used for other purposes; ask your health care provider or pharmacist if you have questions. COMMON BRAND NAME(S): Onxol, Taxol What should I tell my health care provider before I take this medicine? They need to know if you have any of these conditions: -blood disorders -irregular heartbeat -infection (especially a virus infection such as chickenpox, cold sores, or herpes) -liver disease -previous or ongoing radiation therapy -an unusual or allergic reaction to paclitaxel, alcohol, polyoxyethylated castor oil, other chemotherapy agents, other medicines, foods,  dyes, or preservatives -pregnant or trying to get pregnant -breast-feeding How should I use this medicine? This drug is given as an infusion into a vein. It is administered in a hospital or clinic by a specially trained health care professional. Talk to your pediatrician regarding the use of this medicine in children. Special care may be needed. Overdosage: If you think you have taken too much of this medicine contact a poison control center or emergency room at once. NOTE: This medicine is only for you. Do not share this medicine with others. What if I miss a dose? It is important not to miss your dose. Call your doctor or health care professional if you are unable to keep an appointment. What may interact with this medicine? Do not take this medicine with any of the following medications: -disulfiram -metronidazole This medicine may also interact with the following medications: -cyclosporine -diazepam -ketoconazole -medicines to increase blood counts like filgrastim, pegfilgrastim, sargramostim -other chemotherapy drugs like cisplatin, doxorubicin, epirubicin, etoposide, teniposide, vincristine -quinidine -testosterone -vaccines -verapamil Talk to your doctor or health care professional before taking any of these medicines: -acetaminophen -aspirin -ibuprofen -ketoprofen -naproxen This list may not describe all possible interactions. Give your health care provider a list of all the medicines, herbs, non-prescription drugs, or dietary supplements you use. Also tell them if you smoke, drink alcohol, or use illegal drugs. Some items may interact with your medicine. What should I watch for while using this medicine? Your condition will be monitored carefully while you are receiving this medicine. You will need important blood work done while you are taking this medicine. This medicine can cause serious allergic reactions. To reduce your risk you will need  to take other medicine(s)  before treatment with this medicine. If you experience allergic reactions like skin rash, itching or hives, swelling of the face, lips, or tongue, tell your doctor or health care professional right away. In some cases, you may be given additional medicines to help with side effects. Follow all directions for their use. This drug may make you feel generally unwell. This is not uncommon, as chemotherapy can affect healthy cells as well as cancer cells. Report any side effects. Continue your course of treatment even though you feel ill unless your doctor tells you to stop. Call your doctor or health care professional for advice if you get a fever, chills or sore throat, or other symptoms of a cold or flu. Do not treat yourself. This drug decreases your body's ability to fight infections. Try to avoid being around people who are sick. This medicine may increase your risk to bruise or bleed. Call your doctor or health care professional if you notice any unusual bleeding. Be careful brushing and flossing your teeth or using a toothpick because you may get an infection or bleed more easily. If you have any dental work done, tell your dentist you are receiving this medicine. Avoid taking products that contain aspirin, acetaminophen, ibuprofen, naproxen, or ketoprofen unless instructed by your doctor. These medicines may hide a fever. Do not become pregnant while taking this medicine. Women should inform their doctor if they wish to become pregnant or think they might be pregnant. There is a potential for serious side effects to an unborn child. Talk to your health care professional or pharmacist for more information. Do not breast-feed an infant while taking this medicine. Men are advised not to father a child while receiving this medicine. This product may contain alcohol. Ask your pharmacist or healthcare provider if this medicine contains alcohol. Be sure to tell all healthcare providers you are taking this  medicine. Certain medicines, like metronidazole and disulfiram, can cause an unpleasant reaction when taken with alcohol. The reaction includes flushing, headache, nausea, vomiting, sweating, and increased thirst. The reaction can last from 30 minutes to several hours. What side effects may I notice from receiving this medicine? Side effects that you should report to your doctor or health care professional as soon as possible: -allergic reactions like skin rash, itching or hives, swelling of the face, lips, or tongue -low blood counts - This drug may decrease the number of white blood cells, red blood cells and platelets. You may be at increased risk for infections and bleeding. -signs of infection - fever or chills, cough, sore throat, pain or difficulty passing urine -signs of decreased platelets or bleeding - bruising, pinpoint red spots on the skin, black, tarry stools, nosebleeds -signs of decreased red blood cells - unusually weak or tired, fainting spells, lightheadedness -breathing problems -chest pain -high or low blood pressure -mouth sores -nausea and vomiting -pain, swelling, redness or irritation at the injection site -pain, tingling, numbness in the hands or feet -slow or irregular heartbeat -swelling of the ankle, feet, hands Side effects that usually do not require medical attention (report to your doctor or health care professional if they continue or are bothersome): -bone pain -complete hair loss including hair on your head, underarms, pubic hair, eyebrows, and eyelashes -changes in the color of fingernails -diarrhea -loosening of the fingernails -loss of appetite -muscle or joint pain -red flush to skin -sweating This list may not describe all possible side effects. Call your doctor for medical advice  about side effects. You may report side effects to FDA at 1-800-FDA-1088. Where should I keep my medicine? This drug is given in a hospital or clinic and will not be  stored at home. NOTE: This sheet is a summary. It may not cover all possible information. If you have questions about this medicine, talk to your doctor, pharmacist, or health care provider.  2018 Elsevier/Gold Standard (2015-03-28 19:58:00)  Carboplatin injection What is this medicine? CARBOPLATIN (KAR boe pla tin) is a chemotherapy drug. It targets fast dividing cells, like cancer cells, and causes these cells to die. This medicine is used to treat ovarian cancer and many other cancers. This medicine may be used for other purposes; ask your health care provider or pharmacist if you have questions. COMMON BRAND NAME(S): Paraplatin What should I tell my health care provider before I take this medicine? They need to know if you have any of these conditions: -blood disorders -hearing problems -kidney disease -recent or ongoing radiation therapy -an unusual or allergic reaction to carboplatin, cisplatin, other chemotherapy, other medicines, foods, dyes, or preservatives -pregnant or trying to get pregnant -breast-feeding How should I use this medicine? This drug is usually given as an infusion into a vein. It is administered in a hospital or clinic by a specially trained health care professional. Talk to your pediatrician regarding the use of this medicine in children. Special care may be needed. Overdosage: If you think you have taken too much of this medicine contact a poison control center or emergency room at once. NOTE: This medicine is only for you. Do not share this medicine with others. What if I miss a dose? It is important not to miss a dose. Call your doctor or health care professional if you are unable to keep an appointment. What may interact with this medicine? -medicines for seizures -medicines to increase blood counts like filgrastim, pegfilgrastim, sargramostim -some antibiotics like amikacin, gentamicin, neomycin, streptomycin, tobramycin -vaccines Talk to your doctor or  health care professional before taking any of these medicines: -acetaminophen -aspirin -ibuprofen -ketoprofen -naproxen This list may not describe all possible interactions. Give your health care provider a list of all the medicines, herbs, non-prescription drugs, or dietary supplements you use. Also tell them if you smoke, drink alcohol, or use illegal drugs. Some items may interact with your medicine. What should I watch for while using this medicine? Your condition will be monitored carefully while you are receiving this medicine. You will need important blood work done while you are taking this medicine. This drug may make you feel generally unwell. This is not uncommon, as chemotherapy can affect healthy cells as well as cancer cells. Report any side effects. Continue your course of treatment even though you feel ill unless your doctor tells you to stop. In some cases, you may be given additional medicines to help with side effects. Follow all directions for their use. Call your doctor or health care professional for advice if you get a fever, chills or sore throat, or other symptoms of a cold or flu. Do not treat yourself. This drug decreases your body's ability to fight infections. Try to avoid being around people who are sick. This medicine may increase your risk to bruise or bleed. Call your doctor or health care professional if you notice any unusual bleeding. Be careful brushing and flossing your teeth or using a toothpick because you may get an infection or bleed more easily. If you have any dental work done, tell your dentist  you are receiving this medicine. Avoid taking products that contain aspirin, acetaminophen, ibuprofen, naproxen, or ketoprofen unless instructed by your doctor. These medicines may hide a fever. Do not become pregnant while taking this medicine. Women should inform their doctor if they wish to become pregnant or think they might be pregnant. There is a potential for  serious side effects to an unborn child. Talk to your health care professional or pharmacist for more information. Do not breast-feed an infant while taking this medicine. What side effects may I notice from receiving this medicine? Side effects that you should report to your doctor or health care professional as soon as possible: -allergic reactions like skin rash, itching or hives, swelling of the face, lips, or tongue -signs of infection - fever or chills, cough, sore throat, pain or difficulty passing urine -signs of decreased platelets or bleeding - bruising, pinpoint red spots on the skin, black, tarry stools, nosebleeds -signs of decreased red blood cells - unusually weak or tired, fainting spells, lightheadedness -breathing problems -changes in hearing -changes in vision -chest pain -high blood pressure -low blood counts - This drug may decrease the number of white blood cells, red blood cells and platelets. You may be at increased risk for infections and bleeding. -nausea and vomiting -pain, swelling, redness or irritation at the injection site -pain, tingling, numbness in the hands or feet -problems with balance, talking, walking -trouble passing urine or change in the amount of urine Side effects that usually do not require medical attention (report to your doctor or health care professional if they continue or are bothersome): -hair loss -loss of appetite -metallic taste in the mouth or changes in taste This list may not describe all possible side effects. Call your doctor for medical advice about side effects. You may report side effects to FDA at 1-800-FDA-1088. Where should I keep my medicine? This drug is given in a hospital or clinic and will not be stored at home. NOTE: This sheet is a summary. It may not cover all possible information. If you have questions about this medicine, talk to your doctor, pharmacist, or health care provider.  2018 Elsevier/Gold Standard  (2007-09-01 14:38:05)  

## 2017-01-02 ENCOUNTER — Telehealth: Payer: Self-pay | Admitting: *Deleted

## 2017-01-02 NOTE — Telephone Encounter (Signed)
-----   Message from Beatriz Chancellor, RN sent at 01/01/2017  3:39 PM EDT ----- Regarding: Harlon Ditty Follow Up Call Contact: [phone] Pt of Dr. Alvy Bimler first time taxol and carboplatin. Pt tolerated well.

## 2017-01-02 NOTE — Telephone Encounter (Signed)
Pt states she is doing great. Eating and drinking fine. Bowels moving fine. No questions or concerns

## 2017-01-03 ENCOUNTER — Telehealth: Payer: Self-pay

## 2017-01-03 ENCOUNTER — Ambulatory Visit: Payer: Medicare Other

## 2017-01-03 NOTE — Telephone Encounter (Signed)
Pt is having low back pain for the last few weeks, "uncomfortable". Heating pad did not help. Headache this AM. OK to use tylenol, heat or hot shower is good, claritin, drink plenty of water.

## 2017-01-06 ENCOUNTER — Encounter: Payer: Medicare Other | Admitting: Genetics

## 2017-01-06 ENCOUNTER — Other Ambulatory Visit: Payer: Medicare Other

## 2017-01-07 ENCOUNTER — Other Ambulatory Visit (HOSPITAL_COMMUNITY): Payer: Self-pay

## 2017-01-07 ENCOUNTER — Inpatient Hospital Stay (HOSPITAL_COMMUNITY)
Admission: EM | Admit: 2017-01-07 | Discharge: 2017-01-11 | DRG: 640 | Disposition: A | Discharge status: Home / Self Care | Payer: Medicare Other | Attending: Family Medicine | Admitting: Family Medicine

## 2017-01-07 ENCOUNTER — Encounter (HOSPITAL_COMMUNITY): Payer: Self-pay

## 2017-01-07 ENCOUNTER — Other Ambulatory Visit: Payer: Self-pay

## 2017-01-07 ENCOUNTER — Emergency Department (HOSPITAL_COMMUNITY): Payer: Medicare Other

## 2017-01-07 DIAGNOSIS — Z9889 Other specified postprocedural states: Secondary | ICD-10-CM

## 2017-01-07 DIAGNOSIS — T451X5A Adverse effect of antineoplastic and immunosuppressive drugs, initial encounter: Secondary | ICD-10-CM | POA: Diagnosis present

## 2017-01-07 DIAGNOSIS — Z887 Allergy status to serum and vaccine status: Secondary | ICD-10-CM

## 2017-01-07 DIAGNOSIS — Z803 Family history of malignant neoplasm of breast: Secondary | ICD-10-CM

## 2017-01-07 DIAGNOSIS — I5033 Acute on chronic diastolic (congestive) heart failure: Secondary | ICD-10-CM | POA: Diagnosis present

## 2017-01-07 DIAGNOSIS — E871 Hypo-osmolality and hyponatremia: Secondary | ICD-10-CM | POA: Diagnosis not present

## 2017-01-07 DIAGNOSIS — Z8249 Family history of ischemic heart disease and other diseases of the circulatory system: Secondary | ICD-10-CM

## 2017-01-07 DIAGNOSIS — E860 Dehydration: Secondary | ICD-10-CM

## 2017-01-07 DIAGNOSIS — J90 Pleural effusion, not elsewhere classified: Secondary | ICD-10-CM

## 2017-01-07 DIAGNOSIS — R112 Nausea with vomiting, unspecified: Secondary | ICD-10-CM | POA: Diagnosis present

## 2017-01-07 DIAGNOSIS — C561 Malignant neoplasm of right ovary: Secondary | ICD-10-CM | POA: Diagnosis not present

## 2017-01-07 DIAGNOSIS — I10 Essential (primary) hypertension: Secondary | ICD-10-CM | POA: Diagnosis not present

## 2017-01-07 DIAGNOSIS — I110 Hypertensive heart disease with heart failure: Secondary | ICD-10-CM | POA: Diagnosis present

## 2017-01-07 DIAGNOSIS — E876 Hypokalemia: Secondary | ICD-10-CM | POA: Diagnosis present

## 2017-01-07 DIAGNOSIS — D649 Anemia, unspecified: Secondary | ICD-10-CM | POA: Diagnosis present

## 2017-01-07 DIAGNOSIS — J910 Malignant pleural effusion: Secondary | ICD-10-CM | POA: Diagnosis present

## 2017-01-07 DIAGNOSIS — I340 Nonrheumatic mitral (valve) insufficiency: Secondary | ICD-10-CM | POA: Diagnosis present

## 2017-01-07 DIAGNOSIS — Z7982 Long term (current) use of aspirin: Secondary | ICD-10-CM

## 2017-01-07 DIAGNOSIS — R1110 Vomiting, unspecified: Secondary | ICD-10-CM

## 2017-01-07 DIAGNOSIS — L89151 Pressure ulcer of sacral region, stage 1: Secondary | ICD-10-CM | POA: Diagnosis present

## 2017-01-07 DIAGNOSIS — R0602 Shortness of breath: Secondary | ICD-10-CM

## 2017-01-07 DIAGNOSIS — E785 Hyperlipidemia, unspecified: Secondary | ICD-10-CM | POA: Diagnosis present

## 2017-01-07 DIAGNOSIS — I471 Supraventricular tachycardia: Secondary | ICD-10-CM | POA: Diagnosis present

## 2017-01-07 DIAGNOSIS — E7800 Pure hypercholesterolemia, unspecified: Secondary | ICD-10-CM | POA: Diagnosis present

## 2017-01-07 DIAGNOSIS — F0390 Unspecified dementia without behavioral disturbance: Secondary | ICD-10-CM

## 2017-01-07 DIAGNOSIS — Z885 Allergy status to narcotic agent status: Secondary | ICD-10-CM

## 2017-01-07 LAB — BASIC METABOLIC PANEL
ANION GAP: 10 (ref 5–15)
ANION GAP: 9 (ref 5–15)
Anion gap: 9 (ref 5–15)
BUN: 16 mg/dL (ref 6–20)
BUN: 18 mg/dL (ref 6–20)
BUN: 19 mg/dL (ref 6–20)
CALCIUM: 9.2 mg/dL (ref 8.9–10.3)
CALCIUM: 9 mg/dL (ref 8.9–10.3)
CHLORIDE: 81 mmol/L — AB (ref 101–111)
CO2: 27 mmol/L (ref 22–32)
CO2: 26 mmol/L (ref 22–32)
CO2: 28 mmol/L (ref 22–32)
Calcium: 8.8 mg/dL — ABNORMAL LOW (ref 8.9–10.3)
Chloride: 81 mmol/L — ABNORMAL LOW (ref 101–111)
Chloride: 84 mmol/L — ABNORMAL LOW (ref 101–111)
Creatinine, Ser: 0.66 mg/dL (ref 0.44–1.00)
Creatinine, Ser: 0.65 mg/dL (ref 0.44–1.00)
Creatinine, Ser: 0.62 mg/dL (ref 0.44–1.00)
GFR calc Af Amer: >60 mL/min (ref >60)
GFR calc Af Amer: >60 mL/min (ref >60)
GFR calc non Af Amer: >60 mL/min (ref >60)
GFR calc non Af Amer: >60 mL/min (ref >60)
GLUCOSE: 116 mg/dL — AB (ref 65–99)
GLUCOSE: 106 mg/dL — AB (ref 65–99)
Glucose, Bld: 124 mg/dL — ABNORMAL HIGH (ref 65–99)
POTASSIUM: 3.8 mmol/L (ref 3.5–5.1)
Potassium: 4.1 mmol/L (ref 3.5–5.1)
Potassium: 3.7 mmol/L (ref 3.5–5.1)
SODIUM: 117 mmol/L — AB (ref 135–145)
Sodium: 117 mmol/L — CL (ref 135–145)
Sodium: 121 mmol/L — ABNORMAL LOW (ref 135–145)

## 2017-01-07 LAB — COMPREHENSIVE METABOLIC PANEL
ALBUMIN: 3.5 g/dL (ref 3.5–5.0)
ALBUMIN: 3.4 g/dL — AB (ref 3.5–5.0)
ALK PHOS: 70 U/L (ref 38–126)
ALK PHOS: 65 U/L (ref 38–126)
ALT: 30 U/L (ref 14–54)
ALT: 28 U/L (ref 14–54)
ANION GAP: 11 (ref 5–15)
AST: 32 U/L (ref 15–41)
AST: 28 U/L (ref 15–41)
Anion gap: 9 (ref 5–15)
BUN: 18 mg/dL (ref 6–20)
BUN: 17 mg/dL (ref 6–20)
CALCIUM: 9.2 mg/dL (ref 8.9–10.3)
CALCIUM: 8.9 mg/dL (ref 8.9–10.3)
CO2: 27 mmol/L (ref 22–32)
CO2: 26 mmol/L (ref 22–32)
CREATININE: 0.67 mg/dL (ref 0.44–1.00)
Chloride: 79 mmol/L — ABNORMAL LOW (ref 101–111)
Chloride: 79 mmol/L — ABNORMAL LOW (ref 101–111)
Creatinine, Ser: 0.6 mg/dL (ref 0.44–1.00)
GFR calc Af Amer: >60 mL/min (ref >60)
GFR calc Af Amer: >60 mL/min (ref >60)
GFR calc non Af Amer: >60 mL/min (ref >60)
GFR calc non Af Amer: >60 mL/min (ref >60)
GLUCOSE: 131 mg/dL — AB (ref 65–99)
GLUCOSE: 121 mg/dL — AB (ref 65–99)
Potassium: 4.1 mmol/L (ref 3.5–5.1)
Potassium: 3.9 mmol/L (ref 3.5–5.1)
SODIUM: 115 mmol/L — AB (ref 135–145)
SODIUM: 116 mmol/L — AB (ref 135–145)
Total Bilirubin: 1.6 mg/dL — ABNORMAL HIGH (ref 0.3–1.2)
Total Bilirubin: 1.4 mg/dL — ABNORMAL HIGH (ref 0.3–1.2)
Total Protein: 6.5 g/dL (ref 6.5–8.1)
Total Protein: 6.3 g/dL — ABNORMAL LOW (ref 6.5–8.1)

## 2017-01-07 LAB — CBC WITH DIFFERENTIAL/PLATELET
BASOS ABS: 0 10*3/uL (ref 0.0–0.1)
Basophils Relative: 0 %
EOS PCT: 1 %
Eosinophils Absolute: 0 10*3/uL (ref 0.0–0.7)
HEMATOCRIT: 33.7 % — AB (ref 36.0–46.0)
HEMOGLOBIN: 12.5 g/dL (ref 12.0–15.0)
LYMPHS ABS: 0.7 10*3/uL (ref 0.7–4.0)
LYMPHS PCT: 17 %
MCH: 32.1 pg (ref 26.0–34.0)
MCHC: 37.1 g/dL — ABNORMAL HIGH (ref 30.0–36.0)
MCV: 86.4 fL (ref 78.0–100.0)
Monocytes Absolute: 0.6 10*3/uL (ref 0.1–1.0)
Monocytes Relative: 16 %
NEUTROS ABS: 2.7 10*3/uL (ref 1.7–7.7)
NEUTROS PCT: 66 %
Platelets: 165 10*3/uL (ref 150–400)
RBC: 3.9 MIL/uL (ref 3.87–5.11)
RDW: 13.3 % (ref 11.5–15.5)
WBC: 4 10*3/uL (ref 4.0–10.5)

## 2017-01-07 LAB — CBC
HCT: 32.8 % — ABNORMAL LOW (ref 36.0–46.0)
HEMOGLOBIN: 12.1 g/dL (ref 12.0–15.0)
MCH: 32 pg (ref 26.0–34.0)
MCHC: 36.9 g/dL — ABNORMAL HIGH (ref 30.0–36.0)
MCV: 86.8 fL (ref 78.0–100.0)
Platelets: 185 10*3/uL (ref 150–400)
RBC: 3.78 MIL/uL — AB (ref 3.87–5.11)
RDW: 13.3 % (ref 11.5–15.5)
WBC: 4.4 10*3/uL (ref 4.0–10.5)

## 2017-01-07 LAB — OSMOLALITY: Osmolality: 242 mOsm/kg — CL (ref 275–295)

## 2017-01-07 LAB — LIPASE, BLOOD: Lipase: 21 U/L (ref 11–51)

## 2017-01-07 MED ORDER — PROMETHAZINE HCL 25 MG/ML IJ SOLN
12.5000 mg | Freq: Once | INTRAMUSCULAR | Status: AC
  Administered 2017-01-07: 12.5 mg via INTRAVENOUS
  Filled 2017-01-07: qty 1

## 2017-01-07 MED ORDER — ONDANSETRON HCL 4 MG PO TABS: 4.0000 mg | ORAL_TABLET | Freq: Four times a day (QID) | ORAL | Status: DC | PRN

## 2017-01-07 MED ORDER — SIMVASTATIN 20 MG PO TABS
20.0000 mg | ORAL_TABLET | Freq: Every day | ORAL | Status: DC
  Administered 2017-01-07 – 2017-01-10 (×4): 20 mg via ORAL
  Filled 2017-01-07 (×4): qty 1

## 2017-01-07 MED ORDER — ONDANSETRON HCL 4 MG/2ML IJ SOLN
4.0000 mg | Freq: Four times a day (QID) | INTRAMUSCULAR | Status: DC | PRN
  Administered 2017-01-07 – 2017-01-09 (×4): 4 mg via INTRAVENOUS
  Filled 2017-01-07 (×4): qty 2

## 2017-01-07 MED ORDER — BISOPROLOL FUMARATE 5 MG PO TABS
10.0000 mg | ORAL_TABLET | Freq: Every day | ORAL | Status: DC
  Administered 2017-01-07 – 2017-01-11 (×5): 10 mg via ORAL
  Filled 2017-01-07 (×5): qty 2

## 2017-01-07 MED ORDER — SODIUM CHLORIDE 0.9 % IV BOLUS (SEPSIS)
1000.0000 mL | Freq: Once | INTRAVENOUS | Status: AC
  Administered 2017-01-07: 1000 mL via INTRAVENOUS

## 2017-01-07 MED ORDER — SODIUM CHLORIDE 0.9 % IV SOLN
INTRAVENOUS | Status: DC
  Administered 2017-01-07 – 2017-01-08 (×3): via INTRAVENOUS

## 2017-01-07 MED ORDER — SODIUM CHLORIDE 0.9 % IV SOLN: Freq: Once | INTRAVENOUS | Status: DC

## 2017-01-07 MED ORDER — ONDANSETRON HCL 4 MG/2ML IJ SOLN
4.0000 mg | Freq: Once | INTRAMUSCULAR | Status: AC
  Administered 2017-01-07: 4 mg via INTRAVENOUS
  Filled 2017-01-07: qty 2

## 2017-01-07 MED ORDER — ADULT MULTIVITAMIN W/MINERALS CH
1.0000 | ORAL_TABLET | Freq: Every day | ORAL | Status: DC
  Administered 2017-01-07 – 2017-01-11 (×5): 1 via ORAL
  Filled 2017-01-07 (×5): qty 1

## 2017-01-07 NOTE — H&P (Signed)
TRH H&P    Patient Demographics:    Denise Bonilla, is a 83 y.o. female  MRN: [id number]  DOB - [birth year]  Admit Date - 01/07/2017  Referring MD/NP/PA: Dr Dina Rich  Outpatient Primary MD for the patient is Monico Blitz, MD  Patient coming from: Home  Chief Complaint  Patient presents with  . Emesis    curently receiving chemo      HPI:    Denise Bonilla  is a 83 y.o. female, with a history of dementia, hypertension and recent diagnosis of ovarian cancer status post first chemotherapy on Wednesday. Patient has been doing well after chemotherapy but in the last 24 hours had 4-5 episodes of vomiting. And this morning patient noted streaks of blood in the vomitus. Patient denies any pain at this time. No chest pain or shortness of breath. No fever or dysuria.    in the ED , sodium was 115. Patient was started on normal saline bolus, after receiving hospitalist patient developed wheezing but it. Patient has grade 1 diastolic dysfunction, EF 31%,    Review of systems:      All other systems reviewed and are negative.   With Past History of the following :    Past Medical History:  Diagnosis Date  . Dementia   . High cholesterol   . Hypertension   . Ovarian cancer on right [facility]) 12/23/2016      Past Surgical History:  Procedure Laterality Date  . CYSTECTOMY    . IR FLUORO GUIDE PORT INSERTION RIGHT  2020/04/2817  . IR US GUIDE VASC ACCESS RIGHT  2020/04/2817      Social History:      Social History  Substance Use Topics  . Smoking status: Never Smoker  . Smokeless tobacco: Never Used  . Alcohol use No       Family History :     Family History  Problem Relation Age of Onset  . Cancer Other   . Hyperlipidemia Other   . Hypertension Other   . Heart failure Mother   . Cancer Sister        breast ca      Home Medications:   Prior to Admission medications   Medication Sig Start Date  End Date Taking? Authorizing Provider  aspirin EC 81 MG tablet Take 81 mg by mouth daily.    [provider]  bisoprolol (ZEBETA) 10 MG tablet Take 10 mg by mouth daily.    [provider]  Cholecalciferol (VITAMIN D) 2000 units tablet Take 2,000 Units by mouth daily.    [provider]  dexamethasone (DECADRON) 4 MG tablet 5 tabs the night before and 5 tabs in the morning of chemo, take with food, every 3 weeks 12/30/16   Heath Lark, MD  furosemide (LASIX) 20 MG tablet Take 1 tablet (20 mg total) by mouth daily. 12/14/16   Hosie Poisson, MD  lidocaine-prilocaine (EMLA) cream Apply to affected area once 12/30/16   Heath Lark, MD  Multiple Vitamins-Minerals (MULTI FOR HER 50+) TABS Take 1 tablet by  mouth daily.    [provider]  Omega-3 Fatty Acids (FISH OIL) 500 MG CAPS Take 1 capsule by mouth daily.    [provider]  ondansetron (ZOFRAN) 8 MG tablet Take three times a day as needed 12/31/16   Heath Lark, MD  prochlorperazine (COMPAZINE) 10 MG tablet Take 1 tablet (10 mg total) by mouth every 6 (six) hours as needed (Nausea or vomiting). 12/30/16   Heath Lark, MD  simvastatin (ZOCOR) 20 MG tablet Take 20 mg by mouth at bedtime.     [provider]     Allergies:     Allergies  Allergen Reactions  . Tetanus Toxoids Anaphylaxis  . Codeine Nausea And Vomiting     Physical Exam:   Vitals  Blood pressure (!) 154/67, pulse 78, temperature 98 F (36.7 C), temperature source Oral, resp. rate 20, height 5' (1.524 m), weight 70.8 kg (156 lb), SpO2 97 %.  1.  General: Appears in no acute distress   2. Psychiatric:  Intact judgement and  insight, awake alert, oriented x 3.  3. Neurologic: No focal neurological deficits, all cranial nerves intact.Strength 5/5 all 4 extremities, sensation intact all 4 extremities, plantars down going.  4. Eyes :  anicteric sclerae, moist conjunctivae with no lid lag. PERRLA.  5. ENMT:  Oropharynx  clear with moist mucous membranes and good dentition  6. Neck:  supple, no cervical lymphadenopathy appriciated, No thyromegaly  7. Respiratory : Normal respiratory effort, bilateral wheezing   8. Cardiovascular : RRR, no gallops, rubs or murmurs, no leg edema  9. Gastrointestinal:  Positive bowel sounds, abdomen soft, non-tender to palpation,no hepatosplenomegaly, no rigidity or guarding       10. Skin:  No cyanosis, normal texture and turgor, no rash, lesions or ulcers  11.Musculoskeletal:  Good muscle tone,  joints appear normal , no effusions,  normal range of motion    Data Review:    CBC  Recent Labs Lab 01/01/17 0836 01/07/17 0202  WBC 5.6 4.0  HGB 12.6 12.5  HCT 35.8 33.7*  PLT 289 165  MCV 91.3 86.4  MCH 32.1 32.1  MCHC 35.2 37.1*  RDW 13.9 13.3  LYMPHSABS 0.8* 0.7  MONOABS 0.1 0.6  EOSABS 0.0 0.0  BASOSABS 0.0 0.0   ------------------------------------------------------------------------------------------------------------------  Chemistries   Recent Labs Lab 01/01/17 0836 01/07/17 0202  NA 134* 115*  K 3.6 4.1  CL  --  79*  CO2 24 27  GLUCOSE 170* 131*  BUN 14.7 18  CREATININE 0.8 0.67  CALCIUM 9.8 9.2  AST 19 32  ALT 17 30  ALKPHOS 67 70  BILITOT 0.55 1.6*   ------------------------------------------------------------------------------------------------------------------  ------------------------------------------------------------------------------------------------------------------ GFR: Estimated Creatinine Clearance: 45.1 mL/min (by C-G formula based on SCr of 0.67 mg/dL). Liver Function Tests:  Recent Labs Lab 01/01/17 0836 01/07/17 0202  AST 19 32  ALT 17 30  ALKPHOS 67 70  BILITOT 0.55 1.6*  PROT 6.7 6.5  ALBUMIN 3.5 3.5    Recent Labs Lab 01/07/17 0202  LIPASE 21    --------------------------------------------------------------------------------------------------------------- Urine analysis:    Component  Value Date/Time   COLORURINE YELLOW 12/09/2016 2031   APPEARANCEUR HAZY (A) 12/09/2016 2031   LABSPEC 1.015 12/09/2016 2031   PHURINE 5.0 12/09/2016 2031   GLUCOSEU NEGATIVE 12/09/2016 2031   HGBUR NEGATIVE 12/09/2016 2031   BILIRUBINUR NEGATIVE 12/09/2016 2031   KETONESUR NEGATIVE 12/09/2016 2031   PROTEINUR NEGATIVE 12/09/2016 2031   NITRITE NEGATIVE 12/09/2016 2031   LEUKOCYTESUR NEGATIVE 12/09/2016 2031  Imaging Results:    Dg Abdomen Acute W/chest  Result Date: 01/07/2017 CLINICAL DATA:  Nausea and vomiting since last night. Cough. Receiving chemotherapy for ovarian cancer. EXAM: DG ABDOMEN ACUTE W/ 1V CHEST COMPARISON:  CT abdomen pelvis 12/12/2016. Chest radiograph 12/12/2016. FINDINGS: Large left pleural effusion with basilar atelectasis or infiltration similar to previous studies. Right lung is clear. Heart size and pulmonary vascularity are normal. No pneumothorax. Power port type central venous catheter with tip over the low SVC region. Calcification in the mitral valve annulus. Degenerative changes in the shoulders. Thoracolumbar scoliosis convex towards the right. Scattered gas and stool in the colon. No small or large bowel distention. No free intra-abdominal air. No abnormal air-fluid levels. Degenerative changes throughout the spine. Calcified phleboliths in the pelvis. No radiopaque stones. IMPRESSION: Large left pleural effusion with basilar atelectasis or infiltration similar previous studies. Nonobstructive bowel gas pattern. Electronically Signed   By: Lucienne Capers M.D.   On: 01/07/2017 03:25    My personal review of EKG: Rhythm NSR   Assessment & Plan:    Active Problems:   Hyponatremia   HTN (hypertension)   Ovarian cancer on right (HCC)   Vomiting   Hyponatremia- likely from vomiting/dehydration, patient developed wheezing after starting IV fluid bolus in the ED. We'll hold IV fluids at this time due to developing CHF exacerbation. Patient has  diastolic dysfunction. Will recheck BMP at 6 AM and then every 4 hours. Keep nothing by mouth. We'll check serum osmolality.  Vomiting- started yesterday. Patient got chemotherapy on wednesday. Keep nothing by mouth. Zofran when necessary for nausea vomiting. There is questionable bloody streak in the vomitus. No vomiting since patient came to ED.  Acute on chronic diastolic CHF- developed in the ED after patient started getting IV fluid bolus. Oxygen saturation 93% on room air .Will hold IV fluids at this time. Keep KVO, consider restarting gentle IV fluids once patient's breathing improves. Will not given Lasix due to hyponatremia.  Ovarian cancer- patient getting chemotherapy as outpatient.  Hyperlipidemia-continue Zocor    DVT Prophylaxis-   SCD's  AM Labs Ordered, also please review Full Orders  Family Communication: Admission, patients condition and plan of care including tests being ordered have been discussed with the patient and her daughter at bedside who indicate understanding and agree with the plan and Code Status.  Code Status:  Full code  Admission status: Observation  Time spent in minutes : 60 minutes   Halayna Blane S M.D on 01/07/2017 at 4:33 AM  Between 7am to 7pm - Pager - [phone]. After 7pm go to [email or web address] - password [facility]  [facility] - Office  [phone]

## 2017-01-07 NOTE — Progress Notes (Signed)
CRITICAL VALUE ALERT  Critical Value:  Sodium (Na) 117  Date & Time Notied:  1203 01/07/2017   Provider Notified: Dr Roderic Palau aware  Orders Received/Actions taken: No new orders at this time.

## 2017-01-07 NOTE — Progress Notes (Signed)
CRITICAL VALUE ALERT  Critical Value:  Sodium 117  Date & Time Notied:  01/07/2017  1436  Provider Notified: Dr Roderic Palau aware  Orders Received/Actions taken: no new orders at this time

## 2017-01-07 NOTE — ED Notes (Signed)
Patient transported to X-ray 

## 2017-01-07 NOTE — Progress Notes (Signed)
CRITICAL VALUE ALERT  Critical Value:  Na (sodium) 116  Date & Time Notied:  01/07/2017 812 call from lab   Provider Notified: Dr Roderic Palau via Shea Evans at [place]  Orders Received/Actions taken: No new orders at this time. Na up from 115

## 2017-01-07 NOTE — ED Triage Notes (Signed)
Pt CG reports nausea and vomiting that started last night. Currently receiving chemo for ovarian CA, 1st treatment last Wednesday. CG has given zofran and compazine with no relief. Pt denies pain. CG reports mostly clear vomit/spitting up with occasional pink or blood tinged vomit that she noticed over the past few hours.

## 2017-01-07 NOTE — ED Notes (Signed)
Date and time results received: 01/07/17 2:59 AM   Test: Sodium Critical Value: 115  Name of Provider Notified: Dr Dina Rich  Orders Received? Or Actions Taken?: Actions Taken: MD notified

## 2017-01-07 NOTE — ED Provider Notes (Signed)
[facility] DEPT Provider Note   CSN: [id number] Arrival date & time: 01/07/17  0143     History   Chief Complaint Chief Complaint  Patient presents with  . Emesis    curently receiving chemo    HPI Denise Bonilla is a 83 y.o. female.  HPI  This is an 83 year old female with a history of dementia, hypertension, and recent history of ovarian cancer who presents with emesis. Patient recently underwent first round of chemotherapy on Wednesday. Daughter reports that she's been doing fairly well but over the last 24 hours has had several episodes of emesis. This evening, daughter noted that the emesis had streaks of blood in it. The patient is fairly noncontributory to history taking but denies any pain at this time. She denies chest pain or abdominal pain.  Level 5 caveat for dementia.  Past Medical History:  Diagnosis Date  . Dementia   . High cholesterol   . Hypertension   . Ovarian cancer on right [facility]) 12/23/2016    Patient Active Problem List   Diagnosis Date Noted  . Ovarian cancer on right ([place]) 12/23/2016  . 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 Goals of care, counseling/discussion 12/23/2016  . Chronic constipation 12/23/2016  . 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 Hyponatremia 12/07/2016  . Ascites 12/07/2016  . HTN (hypertension) 12/07/2016  . Multiple thyroid nodules 12/07/2016  . Pleural effusion due to CHF (congestive heart failure) ([place]) 12/07/2016    Past Surgical History:  Procedure Laterality Date  . CYSTECTOMY    . IR FLUORO GUIDE PORT INSERTION RIGHT  20-May-202018  . IR US GUIDE VASC ACCESS RIGHT  20-May-202018    OB History    Gravida Para Term Preterm AB Living   3 3 3     3    SAB TAB Ectopic Multiple Live Births                   Home Medications    Prior to Admission medications   Medication Sig Start Date End Date Taking? Authorizing Provider  aspirin EC 81 MG tablet Take 81 mg by mouth daily.    [provider]    bisoprolol (ZEBETA) 10 MG tablet Take 10 mg by mouth daily.    [provider]  Cholecalciferol (VITAMIN D) 2000 units tablet Take 2,000 Units by mouth daily.    [provider]  dexamethasone (DECADRON) 4 MG tablet 5 tabs the night before and 5 tabs in the morning of chemo, take with food, every 3 weeks 12/30/16   Heath Lark, MD  furosemide (LASIX) 20 MG tablet Take 1 tablet (20 mg total) by mouth daily. 12/14/16   Hosie Poisson, MD  lidocaine-prilocaine (EMLA) cream Apply to affected area once 12/30/16   Heath Lark, MD  Multiple Vitamins-Minerals (MULTI FOR HER 50+) TABS Take 1 tablet by mouth daily.    [provider]  Omega-3 Fatty Acids (FISH OIL) 500 MG CAPS Take 1 capsule by mouth daily.    [provider]  ondansetron (ZOFRAN) 8 MG tablet Take three times a day as needed 12/31/16   Heath Lark, MD  prochlorperazine (COMPAZINE) 10 MG tablet Take 1 tablet (10 mg total) by mouth every 6 (six) hours as needed (Nausea or vomiting). 12/30/16   Heath Lark, MD  simvastatin (ZOCOR) 20 MG tablet Take 20 mg by mouth at bedtime.     [provider]    Family History Family History  Problem Relation Age of Onset  .  Cancer Other   . Hyperlipidemia Other   . Hypertension Other   . Heart failure Mother   . Cancer Sister        breast ca    Social History Social History  Substance Use Topics  . Smoking status: Never Smoker  . Smokeless tobacco: Never Used  . Alcohol use No     Allergies   Tetanus toxoids and Codeine   Review of Systems Review of Systems  Unable to perform ROS: Dementia  Gastrointestinal: Positive for nausea and vomiting.     Physical Exam Updated Vital Signs BP (!) 154/67   Pulse 78   Temp 98 F (36.7 C) (Oral)   Resp 20   Ht 5' (1.524 m)   Wt 70.8 kg (156 lb)   SpO2 97%   BMI 30.47 kg/m   Physical Exam  Constitutional:  Elderly, nontoxic, no acute distress  HENT:  Head: Normocephalic and atraumatic.   Mucous membranes dry  Cardiovascular: Normal rate, regular rhythm and normal heart sounds.   No murmur heard. Pulmonary/Chest: Effort normal. No respiratory distress. She has no wheezes.  Diminished breath sounds left lower lung  Abdominal: Soft. Bowel sounds are normal. There is no tenderness. There is no guarding.  Musculoskeletal:  Trace bilateral lower extremity edema  Neurological: She is alert.  Skin: Skin is warm and dry.  Psychiatric: She has a normal mood and affect.  Nursing note and vitals reviewed.    ED Treatments / Results  Labs (all labs ordered are listed, but only abnormal results are displayed) Labs Reviewed  CBC WITH DIFFERENTIAL/PLATELET - Abnormal; Notable for the following:       Result Value   HCT 33.7 (*)    MCHC 37.1 (*)    All other components within normal limits  COMPREHENSIVE METABOLIC PANEL - Abnormal; Notable for the following:    Sodium 115 (*)    Chloride 79 (*)    Glucose, Bld 131 (*)    Total Bilirubin 1.6 (*)    All other components within normal limits  LIPASE, BLOOD    EKG  EKG Interpretation None       Radiology Dg Abdomen Acute W/chest  Result Date: 01/07/2017 CLINICAL DATA:  Nausea and vomiting since last night. Cough. Receiving chemotherapy for ovarian cancer. EXAM: DG ABDOMEN ACUTE W/ 1V CHEST COMPARISON:  CT abdomen pelvis 12/12/2016. Chest radiograph 12/12/2016. FINDINGS: Large left pleural effusion with basilar atelectasis or infiltration similar to previous studies. Right lung is clear. Heart size and pulmonary vascularity are normal. No pneumothorax. Power port type central venous catheter with tip over the low SVC region. Calcification in the mitral valve annulus. Degenerative changes in the shoulders. Thoracolumbar scoliosis convex towards the right. Scattered gas and stool in the colon. No small or large bowel distention. No free intra-abdominal air. No abnormal air-fluid levels. Degenerative changes throughout the spine.  Calcified phleboliths in the pelvis. No radiopaque stones. IMPRESSION: Large left pleural effusion with basilar atelectasis or infiltration similar previous studies. Nonobstructive bowel gas pattern. Electronically Signed   By: Lucienne Capers M.D.   On: 01/07/2017 03:25    Procedures Procedures (including critical care time)  Medications Ordered in ED Medications  0.9 %  sodium chloride infusion (not administered)  sodium chloride 0.9 % bolus 1,000 mL (1,000 mLs Intravenous New Bag/Given 01/07/17 0222)  ondansetron (ZOFRAN) injection 4 mg (4 mg Intravenous Given 01/07/17 0222)     Initial Impression / Assessment and Plan / ED Course  I have  reviewed the triage vital signs and the nursing notes.  Pertinent labs & imaging results that were available during my care of the patient were reviewed by me and considered in my medical decision making (see chart for details).     Patient presents with vomiting in the setting of recent chemotherapy. Daughter reports recent blood-tinged sputum. She is nontoxic-appearing. Vital signs reassuring. She does appear dry on exam but otherwise no reproducible abdominal pain. Lab work initiated. Last echocardiogram with an EF of 60-70%. Patient given 1 L of fluids. She is also given Zofran. On repeat evaluation, patient has not had any recurrent emesis. Suspect blood-tinged was a Mallory-Weiss tear. Workup notable for significant hyponatremia with a sodium of 116. Most recent sodium 6 days ago was 134. Suspect volume depletion. Fluid started at 125 an hour. Will admit for further slow correction. Of note, chest x-ray does show persistent left pleural effusion. Likely malignant.   Final Clinical Impressions(s) / ED Diagnoses   Final diagnoses:  Non-intractable vomiting with nausea, unspecified vomiting type  Hyponatremia  Dehydration    New Prescriptions New Prescriptions   No medications on file     Merryl Hacker, MD 01/07/17 [phone]

## 2017-01-07 NOTE — Progress Notes (Addendum)
CRITICAL VALUE ALERT  Critical Value:  Serum Osmolality 242  Date & Time Notied:  01/07/2017 1252  Provider Notified: Dr Roderic Palau aware  Orders Received/Actions taken: Dr Roderic Palau in to see pt and family, no new orders at this time.

## 2017-01-07 NOTE — Progress Notes (Signed)
This patient was admitted to the hospital earlier this morning by Dr. Darrick Meigs.  Patient seen and examined. She denies any shortness of breath. No further vomiting. Nausea has improved. Abdomen is benign on exam. Lungs shows diminished breath sounds on left side  Patient was admitted with persistent nausea and vomiting. She recently underwent chemotherapy last week for ovarian cancer. She presented with significant hyponatremia and his serum sodium 115. She has had hyponatremia intermittently over the past month. She was hospitalized in the beginning of July for hyponatremia which improved at the time of discharge. She is also noted to have a left-sided pleural effusion which has tested positive for malignant cells in the past. This appears relatively unchanged since earlier this month. Will start the patient on IV fluids. If she starts developing shortness of breath, can consider thoracentesis of left effusion. Anticipate her serum sodium should improve with saline infusion since it is likely caused by the persistent vomiting and GI losses. She received antiemetics. Vomiting likely related to recent chemotherapy. Continue to monitor labs closely.  Tawania Daponte

## 2017-01-08 ENCOUNTER — Observation Stay (HOSPITAL_COMMUNITY): Payer: Medicare Other

## 2017-01-08 DIAGNOSIS — E785 Hyperlipidemia, unspecified: Secondary | ICD-10-CM | POA: Diagnosis present

## 2017-01-08 DIAGNOSIS — R112 Nausea with vomiting, unspecified: Secondary | ICD-10-CM | POA: Diagnosis present

## 2017-01-08 DIAGNOSIS — E871 Hypo-osmolality and hyponatremia: Principal | ICD-10-CM

## 2017-01-08 DIAGNOSIS — C561 Malignant neoplasm of right ovary: Secondary | ICD-10-CM

## 2017-01-08 DIAGNOSIS — I471 Supraventricular tachycardia: Secondary | ICD-10-CM | POA: Diagnosis present

## 2017-01-08 DIAGNOSIS — E876 Hypokalemia: Secondary | ICD-10-CM | POA: Diagnosis present

## 2017-01-08 DIAGNOSIS — Z803 Family history of malignant neoplasm of breast: Secondary | ICD-10-CM | POA: Diagnosis not present

## 2017-01-08 DIAGNOSIS — I340 Nonrheumatic mitral (valve) insufficiency: Secondary | ICD-10-CM | POA: Diagnosis present

## 2017-01-08 DIAGNOSIS — Z885 Allergy status to narcotic agent status: Secondary | ICD-10-CM | POA: Diagnosis not present

## 2017-01-08 DIAGNOSIS — I110 Hypertensive heart disease with heart failure: Secondary | ICD-10-CM | POA: Diagnosis present

## 2017-01-08 DIAGNOSIS — Z8249 Family history of ischemic heart disease and other diseases of the circulatory system: Secondary | ICD-10-CM | POA: Diagnosis not present

## 2017-01-08 DIAGNOSIS — L89151 Pressure ulcer of sacral region, stage 1: Secondary | ICD-10-CM | POA: Diagnosis present

## 2017-01-08 DIAGNOSIS — Z887 Allergy status to serum and vaccine status: Secondary | ICD-10-CM | POA: Diagnosis not present

## 2017-01-08 DIAGNOSIS — E7800 Pure hypercholesterolemia, unspecified: Secondary | ICD-10-CM | POA: Diagnosis present

## 2017-01-08 DIAGNOSIS — Z7982 Long term (current) use of aspirin: Secondary | ICD-10-CM | POA: Diagnosis not present

## 2017-01-08 DIAGNOSIS — E860 Dehydration: Secondary | ICD-10-CM | POA: Diagnosis present

## 2017-01-08 DIAGNOSIS — J910 Malignant pleural effusion: Secondary | ICD-10-CM | POA: Diagnosis present

## 2017-01-08 DIAGNOSIS — R1110 Vomiting, unspecified: Secondary | ICD-10-CM | POA: Diagnosis not present

## 2017-01-08 DIAGNOSIS — F0390 Unspecified dementia without behavioral disturbance: Secondary | ICD-10-CM | POA: Diagnosis present

## 2017-01-08 DIAGNOSIS — I5033 Acute on chronic diastolic (congestive) heart failure: Secondary | ICD-10-CM | POA: Diagnosis present

## 2017-01-08 DIAGNOSIS — D649 Anemia, unspecified: Secondary | ICD-10-CM | POA: Diagnosis present

## 2017-01-08 DIAGNOSIS — T451X5A Adverse effect of antineoplastic and immunosuppressive drugs, initial encounter: Secondary | ICD-10-CM | POA: Diagnosis present

## 2017-01-08 LAB — BASIC METABOLIC PANEL
Anion gap: 10 (ref 5–15)
Anion gap: 8 (ref 5–15)
Anion gap: 9 (ref 5–15)
BUN: 17 mg/dL (ref 6–20)
BUN: 15 mg/dL (ref 6–20)
BUN: 16 mg/dL (ref 6–20)
CHLORIDE: 85 mmol/L — AB (ref 101–111)
CHLORIDE: 86 mmol/L — AB (ref 101–111)
CHLORIDE: 86 mmol/L — AB (ref 101–111)
CO2: 24 mmol/L (ref 22–32)
CO2: 23 mmol/L (ref 22–32)
CO2: 25 mmol/L (ref 22–32)
CREATININE: 0.49 mg/dL (ref 0.44–1.00)
CREATININE: 0.45 mg/dL (ref 0.44–1.00)
CREATININE: 0.67 mg/dL (ref 0.44–1.00)
Calcium: 8.6 mg/dL — ABNORMAL LOW (ref 8.9–10.3)
Calcium: 8.3 mg/dL — ABNORMAL LOW (ref 8.9–10.3)
Calcium: 8.5 mg/dL — ABNORMAL LOW (ref 8.9–10.3)
GFR calc Af Amer: >60 mL/min (ref >60)
GFR calc non Af Amer: >60 mL/min (ref >60)
GFR calc non Af Amer: >60 mL/min (ref >60)
GLUCOSE: 95 mg/dL (ref 65–99)
Glucose, Bld: 114 mg/dL — ABNORMAL HIGH (ref 65–99)
Glucose, Bld: 88 mg/dL (ref 65–99)
POTASSIUM: 3.6 mmol/L (ref 3.5–5.1)
Potassium: 3.6 mmol/L (ref 3.5–5.1)
Potassium: 3.6 mmol/L (ref 3.5–5.1)
SODIUM: 120 mmol/L — AB (ref 135–145)
Sodium: 119 mmol/L — CL (ref 135–145)
Sodium: 117 mmol/L — CL (ref 135–145)

## 2017-01-08 MED ORDER — ACETAMINOPHEN 325 MG PO TABS
650.0000 mg | ORAL_TABLET | ORAL | Status: DC | PRN
  Administered 2017-01-08 – 2017-01-09 (×2): 650 mg via ORAL
  Filled 2017-01-08 (×2): qty 2

## 2017-01-08 MED ORDER — FUROSEMIDE 10 MG/ML IJ SOLN
40.0000 mg | Freq: Once | INTRAMUSCULAR | Status: AC
  Administered 2017-01-08: 40 mg via INTRAVENOUS
  Filled 2017-01-08: qty 4

## 2017-01-08 NOTE — Care Management Note (Signed)
Case Management Note  Patient Details  Name: Denise Bonilla MRN: [id number] Date of Birth: 10-05-1929  Subjective/Objective:  Adm with hyponatremia. Hx of dementia, CHF. From home with daughter. Ambulatory, no DME. Has PCP, Daughter takes her to appointments. She has insurance with prescription coverage. Daughter does not feel patient needs Home health RN.                   Action/Plan: Anticipate DC home with self care. No needs communicated.    Expected Discharge Date:     01/10/2017             Expected Discharge Plan:  Home/Self Care  In-House Referral:     Discharge planning Services  CM Consult  Post Acute Care Choice:  NA Choice offered to:  NA  DME Arranged:    DME Agency:     HH Arranged:    HH Agency:     Status of Service:  Completed, signed off  If discussed at [employer] of Stay Meetings, dates discussed:    Additional Comments:  Demarie Uhlig, Chauncey Reading, RN 01/08/2017, 10:28 AM

## 2017-01-08 NOTE — Consult Note (Signed)
Denise Bonilla MRN: [id number] DOB/AGE: [birth year] 83 y.o. Primary Care Physician:Shah, Weldon Picking, MD Admit date: 01/07/2017 Chief Complaint:  Chief Complaint  Patient presents with  . Emesis    curently receiving chemo   HPI: Pt is a 83 year old  female past medical history of dementia, hypertension and recent diagnosis of ovarian cancer status post first chemotherapy on Wednesday who came to ER with c/o emesis.  HPI dates back to yesterday when she started having emesis, shehad 4-5 episodes of vomiting. Pt  this morning noted streaks of blood in the vomitus. NO c/o any chest pain at this time. No c/o shortness of breath.  No fever/dysuria. Upon evaluation in ER pt sodium was found to be 115.  Patient was started on normal saline bolus, but later after  patient developed wheezing and it was stopped. Pt later underwent thoracentesis and seen on floor after that. Pt daughter was present in the room. Pt daughter says " She is much better after they took the fluid off' NO c/o chest pain NO c/o fever/cough/chills NO c/o hematuria    Past Medical History:  Diagnosis Date  . Dementia   . High cholesterol   . Hypertension   . Ovarian cancer on right [facility]) 12/23/2016        Family History  Problem Relation Age of Onset  . Cancer Other   . Hyperlipidemia Other   . Hypertension Other   . Heart failure Mother   . Cancer Sister        breast ca   Social History:  reports that she has never smoked. She has never used smokeless tobacco. She reports that she does not drink alcohol or use drugs.   Allergies:  Allergies  Allergen Reactions  . Tetanus Toxoids Anaphylaxis  . Codeine Nausea And Vomiting    Medications Prior to Admission  Medication Sig Dispense Refill  . aspirin EC 81 MG tablet Take 81 mg by mouth daily.    . bisoprolol (ZEBETA) 10 MG tablet Take 10 mg by mouth daily.    . Cholecalciferol (VITAMIN D) 2000 units tablet Take 2,000 Units by mouth daily.    Marland Kitchen  dexamethasone (DECADRON) 4 MG tablet 5 tabs the night before and 5 tabs in the morning of chemo, take with food, every 3 weeks 30 tablet 1  . furosemide (LASIX) 20 MG tablet Take 1 tablet (20 mg total) by mouth daily. 30 tablet 0  . lidocaine-prilocaine (EMLA) cream Apply to affected area once 30 g 3  . Multiple Vitamins-Minerals (MULTI FOR HER 50+) TABS Take 1 tablet by mouth daily.    . Omega-3 Fatty Acids (FISH OIL) 500 MG CAPS Take 1 capsule by mouth daily.    . ondansetron (ZOFRAN) 8 MG tablet Take three times a day as needed 30 tablet 1  . prochlorperazine (COMPAZINE) 10 MG tablet Take 1 tablet (10 mg total) by mouth every 6 (six) hours as needed (Nausea or vomiting). 30 tablet 1  . simvastatin (ZOCOR) 20 MG tablet Take 20 mg by mouth at bedtime.          IPJ:ASNKN from the symptoms mentioned above,there are no other symptoms referable to all systems reviewed.  . bisoprolol  10 mg Oral Daily  . multivitamin with minerals  1 tablet Oral Daily  . simvastatin  20 mg Oral QHS       Physical Exam: Vital signs in last 24 hours: Temp:  [98.2 F (36.8 C)-98.9 F (37.2 C)] 98.3 F (36.8  C) (08/01 1541) Pulse Rate:  [64-133] 81 (08/01 1559) Resp:  [18-20] 20 (08/01 1559) BP: (107-150)/(59-95) 150/77 (08/01 1559) SpO2:  [94 %-97 %] 97 % (08/01 1559) Weight change:  Last BM Date: 01/08/17  Intake/Output from previous day: 07/31 0701 - 08/01 0700 In: 1282.2 [I.V.:1282.2] Out: -  Total I/O In: 1159.2 [P.O.:480; I.V.:679.2] Out: 100 [Urine:100]   Physical Exam: General- pt is awake,follows commands Resp- No acute REsp distress, decreased at bases. CVS- S1S2 regular in rate and rhythm GIT- BS+, soft, NT, ND EXT- NO LE Edema, Cyanosis CNS- CN 2-12 grossly intact. Moving all 4 extremities Psych- normal mood and affect    Lab Results: CBC  Recent Labs  01/07/17 0202 01/07/17 0606  WBC 4.0 4.4  HGB 12.5 12.1  HCT 33.7* 32.8*  PLT 165 185    BMET  Recent Labs   01/08/17 0553 01/08/17 1400  NA 119* 117*  K 3.6 3.6  CL 85* 86*  CO2 24 23  GLUCOSE 95 114*  BUN 17 15  CREATININE 0.49 0.45  CALCIUM 8.6* 8.3*    Sodium trend                          Vomiting       IVF         IVF stopped- now started  July-August 134     ==>    115==> 121===>  117 June -July  120=> 130    Urine  Sodium  Less than 10 Osmolarity  368   Serum Sodium 117     Lab Results  Component Value Date   CALCIUM 8.3 (L) 01/08/2017   PHOS 3.0 12/11/2016   Albumin  3.4 Corrected calcium  8.3+ 0.5=8.8   Impressions:  - Mild LVH with LVEF 65-70% and grade 1 diastolic dysfunction.   Moderately calcified mitral annulus with mild mitral   regurgitation. Trivial aortic regurgitation. Trivial tricuspid   regurgitation with PASP 27 mmHg.  Impression: 1)Hyponatremia         Hypovolemic hyponatremia          Data in favor-          Admitted with Hx of Emesis          Urine na less than 10          Urine Osmo more than serum           Pt now on IVF  2)HTN  Medication- On Beta blockers   3)Anemia HGb at goal (9--11)   4)CHF- hx of diastolic dysfunction stable  5)GI admitted with emesis Primary MD following  6)Oncology-hx of Ovarian ca Primary team following   7)Acid base Co2 at goal   8) Hypocalcemia-  Calcium at goal after correcting for low albumin   9) Resp- admitted with Pleural effusion   Now s/p tap   Clinically better.  Plan:  Agree with current tx and plan Agree with gently IVF resuscitation NO need of 3% as no CNS issues. Will suggest to follow BMET for low Sodium Will suggest to repeat Urine sodium in 24 hours to see the trend.     [place] S 01/08/2017, 4:31 PM

## 2017-01-08 NOTE — Care Management Obs Status (Signed)
[place] NOTIFICATION   Patient Details  Name: Denise Bonilla MRN: [id number] Date of Birth: 1930-02-19   Medicare Observation Status Notification Given:  Yes    Evyn Putzier, Chauncey Reading, RN 01/08/2017, 10:28 AM

## 2017-01-08 NOTE — Progress Notes (Signed)
Called to patient's room by daughter who states patient is c/o it is more difficult to breathe at this time. O2 saturations 96% on room air, 2l of O2 via [place] placed for comfort. Pt does not appear to be in any acute distress at this time. Pt's HR elevated to 130's and sustaining, sinus tach on telemetry. Dr. Sarajane Jews paged and made aware. Pt denies any pain or discomfort at this time.

## 2017-01-08 NOTE — Progress Notes (Signed)
  PROGRESS NOTE  JAVAE BRAATEN VVO:[id number] DOB: 1929/07/14 DOA: 01/07/2017 PCP: Monico Blitz, MD  Brief Narrative: 83 year old woman recently diagnosed with ovarian cancer, status post first chemotherapy treatment one week ago, presented with 4-5 episodes of vomiting. Found to have hyponatremia. Admitted for hyponatremia, vomiting, acute on chronic diastolic CHF.  Assessment/Plan Hypovolemic hyponatremia, secondary to vomiting, poor oral intake, complicated by initiation of chemotherapy recently. -Modest improvement with IV fluids. Continue IV fluids at lower rate and follow serial BMP.   Vomiting.  -Resolved. Secondary to hyponatremia.  Malignant left pleural effusion, symptomatic with shortness of breath and tachycardia, appears increased on chest x-ray today. -I reviewed the chest x-ray findings with daughter at bedside and showed her the images. We discussed risk/benefit of thoracentesis. I recommended thoracentesis for enlarging pleural effusion with shortness of breath and tachycardia.  Ovarian cancer, right -Follow-up with oncology as an outpatient  SVT -continue beta-blocker  Dementia -Appears stable.  Grade 1 diastolic dysfunction  Decrease IV fluids, Lasix 1 (on IV Lasix as an outpatient). Repeat chest x-ray to assess status of pleural effusion and parenchyma.     DVT prophylaxis: SCDs Code Status: full Family Communication: daughter at bedside Disposition Plan: home    Murray Hodgkins, MD  [facility] Direct contact: [phone] --[place]ia [place]  --[email or web address]; password [email or web address]  7PM-7AM contact night coverage as above 01/08/2017, 4:34 PM  LOS: 0 days   Consultants:    Procedures:    Antimicrobials:    Interval history/Subjective: Short of breath this afternoon. Reports pain all over.  Fast heart rate.  Objective: Vitals: 98.9, 18, 92, 72, 107/71  Exam:     Constitutional: Appears ill but not toxic.  Eyes. Pupils, irises, lids  appear unremarkable.  ENT. Mildly hard.. Lips and tongue appear unremarkable.  Cardiovascular. Tachycardic, regular rhythm. No murmur, rub or gallop. No lower extremity edema. Telemetry sinus rhythm with brief episodes of SVT.  Respiratory. Diminished breath sounds on the left with poor air movement. No frank wheezes, rales or rhonchi. Mild increased respiratory effort. Tachypneic.  Skin. No rash or induration. Nontender to palpation.   Psychiatric. Alert. Speech fluent and appropriate.  I have personally reviewed the following:   Labs:  Sodium 119, chloride 85, remainder BMP unremarkable   Imaging studies:  Chest x-ray independently reviewed shows increased left pleural effusion.  Medical tests:     Test discussed with performing physician:    Decision to obtain old records:    Review and summation of old records:  Dr. Calton Dach last note 7/23--planning chemotherapy with curative intent.   Scheduled Meds: . bisoprolol  10 mg Oral Daily  . multivitamin with minerals  1 tablet Oral Daily  . simvastatin  20 mg Oral QHS   Continuous Infusions: . sodium chloride Stopped (01/08/17 1534)    Principal Problem:   Hyponatremia Active Problems:   Ovarian cancer on right (HCC)   Malignant pleural effusion   Vomiting   Dementia   LOS: 0 days

## 2017-01-08 NOTE — Progress Notes (Signed)
CRITICAL VALUE ALERT  Critical Value:  Na 117  Date & Time Notied:  01/08/17 1433  Provider Notified: Dr. Sarajane Jews  Orders Received/Actions taken:

## 2017-01-08 NOTE — Progress Notes (Signed)
CRITICAL VALUE ALERT  Critical Value:  Na 119  Date & Time Notied:  01/08/17 9179  Provider Notified: Dr. Sarajane Jews  Orders Received/Actions taken:

## 2017-01-09 DIAGNOSIS — R1110 Vomiting, unspecified: Secondary | ICD-10-CM

## 2017-01-09 DIAGNOSIS — F0390 Unspecified dementia without behavioral disturbance: Secondary | ICD-10-CM

## 2017-01-09 LAB — BASIC METABOLIC PANEL
ANION GAP: 10 (ref 5–15)
BUN: 18 mg/dL (ref 6–20)
CALCIUM: 8.8 mg/dL — AB (ref 8.9–10.3)
CO2: 25 mmol/L (ref 22–32)
Chloride: 88 mmol/L — ABNORMAL LOW (ref 101–111)
Creatinine, Ser: 0.68 mg/dL (ref 0.44–1.00)
GLUCOSE: 106 mg/dL — AB (ref 65–99)
POTASSIUM: 3.4 mmol/L — AB (ref 3.5–5.1)
SODIUM: 123 mmol/L — AB (ref 135–145)

## 2017-01-09 LAB — MAGNESIUM: MAGNESIUM: 1.6 mg/dL — AB (ref 1.7–2.4)

## 2017-01-09 MED ORDER — NYSTATIN 100000 UNIT/ML MT SUSP
5.0000 mL | Freq: Four times a day (QID) | OROMUCOSAL | Status: DC
  Administered 2017-01-09 – 2017-01-11 (×10): 500000 [IU] via ORAL
  Filled 2017-01-09 (×10): qty 5

## 2017-01-09 MED ORDER — POTASSIUM CHLORIDE 2 MEQ/ML IV SOLN
INTRAVENOUS | Status: DC
  Administered 2017-01-09 – 2017-01-10 (×3): via INTRAVENOUS
  Filled 2017-01-09 (×6): qty 1000

## 2017-01-09 MED ORDER — PROMETHAZINE HCL 12.5 MG PO TABS: 12.5000 mg | ORAL_TABLET | Freq: Four times a day (QID) | ORAL | Status: DC | PRN

## 2017-01-09 MED ORDER — PROCHLORPERAZINE EDISYLATE 5 MG/ML IJ SOLN
5.0000 mg | Freq: Once | INTRAMUSCULAR | Status: AC
  Administered 2017-01-09: 5 mg via INTRAVENOUS
  Filled 2017-01-09: qty 2

## 2017-01-09 MED ORDER — FUROSEMIDE 10 MG/ML IJ SOLN
20.0000 mg | Freq: Every day | INTRAMUSCULAR | Status: DC
  Administered 2017-01-09 – 2017-01-10 (×2): 20 mg via INTRAVENOUS
  Filled 2017-01-09 (×2): qty 2

## 2017-01-09 MED ORDER — POTASSIUM CHLORIDE CRYS ER 20 MEQ PO TBCR
40.0000 meq | EXTENDED_RELEASE_TABLET | Freq: Once | ORAL | Status: AC
  Administered 2017-01-09: 40 meq via ORAL
  Filled 2017-01-09: qty 2

## 2017-01-09 MED ORDER — MAGNESIUM OXIDE 400 (241.3 MG) MG PO TABS
400.0000 mg | ORAL_TABLET | Freq: Two times a day (BID) | ORAL | Status: DC
  Administered 2017-01-09 – 2017-01-11 (×5): 400 mg via ORAL
  Filled 2017-01-09 (×5): qty 1

## 2017-01-09 MED ORDER — ONDANSETRON 4 MG PO TBDP: 4.0000 mg | ORAL_TABLET | Freq: Three times a day (TID) | ORAL | Status: DC | PRN

## 2017-01-09 NOTE — Care Management Important Message (Signed)
Important Message  Patient Details  Name: Denise Bonilla MRN: [id number] Date of Birth: 10-Apr-1930   Medicare Important Message Given:  Yes    Jalyssa Fleisher, Chauncey Reading, RN 01/09/2017, 12:08 PM

## 2017-01-09 NOTE — Progress Notes (Signed)
Subjective: Interval History: has complaints of nausea this morning. Since Zofran didn't work for her she was given some Compazine and is feeling better. Patient is somewhat sleepy. She denies any difficulty breathing. Still unable to eat.  Objective: Vital signs in last 24 hours: Temp:  [98.3 F (36.8 C)-98.7 F (37.1 C)] 98.7 F (37.1 C) (08/02 0717) Pulse Rate:  [63-133] 63 (08/02 0717) Resp:  [18-20] 18 (08/02 0717) BP: (121-160)/(53-95) 160/63 (08/02 0717) SpO2:  [91 %-97 %] 91 % (08/02 0717) Weight change:   Intake/Output from previous day: 08/01 0701 - 08/02 0700 In: 1841.7 [P.O.:480; I.V.:1361.7] Out: 100 [Urine:100] Intake/Output this shift: Total I/O In: -  Out: 200 [Urine:200]  General appearance: alert, cooperative and no distress Resp: clear to auscultation bilaterally Extremities: No edema  Lab Results:  Recent Labs  01/07/17 0202 01/07/17 0606  WBC 4.0 4.4  HGB 12.5 12.1  HCT 33.7* 32.8*  PLT 165 185   BMET:  Recent Labs  01/08/17 2146 01/09/17 0539  NA 120* 123*  K 3.6 3.4*  CL 86* 88*  CO2 25 25  GLUCOSE 88 106*  BUN 16 18  CREATININE 0.67 0.68  CALCIUM 8.5* 8.8*   No results for input(s): PTH in the last 72 hours. Iron Studies: No results for input(s): IRON, TIBC, TRANSFERRIN, FERRITIN in the last 72 hours.  Studies/Results: Dg Chest 1 View  Result Date: 01/08/2017 CLINICAL DATA:  Post left thoracentesis EXAM: CHEST 1 VIEW COMPARISON:  01/08/2017 FINDINGS: Marked improvement in left pleural effusion. Small residual left pleural effusion with left lower lobe atelectasis. Left perihilar density may be due to atelectasis. Right lower lobe atelectasis. Negative for pneumothorax Port-A-Cath tip in the SVC IMPRESSION: Marked improvement in left pleural effusion following thoracentesis. Negative for pneumothorax. Electronically Signed   By: Franchot Gallo M.D.   On: 01/08/2017 16:39   Dg Chest Port 1 View  Result Date: 01/08/2017 CLINICAL DATA:   Shortness of breath. EXAM: PORTABLE CHEST 1 VIEW COMPARISON:  01/07/2017. FINDINGS: PowerPort catheter noted with lead tip over the superior vena cava. Cardiomegaly with bilateral from interstitial prominence. CHF cannot be excluded. Large left pleural effusion, increased in size from prior study 01/08/2015. IMPRESSION: 1. PowerPort catheter stable position. 2. Large left pleural effusion, significantly increase in size from prior exam. Underlying atelectasis and or infiltrate cannot be excluded. 3.  Cardiomegaly bilateral interstitial prominence. Electronically Signed   By: Marcello Moores  Register   On: 01/08/2017 14:06   US Thoracentesis Asp Pleural Space W/img Guide  Result Date: 01/08/2017 INDICATION: Short of breath.  Ovarian cancer with large left effusion EXAM: ULTRASOUND GUIDED left THORACENTESIS MEDICATIONS: None. COMPLICATIONS: None immediate. PROCEDURE: An ultrasound guided thoracentesis was thoroughly discussed with the patient and questions answered. The benefits, risks, alternatives and complications were also discussed. The patient understands and wishes to proceed with the procedure. Written consent was obtained. Ultrasound was performed to localize and mark an adequate pocket of fluid in the left chest. The area was then prepped and draped in the normal sterile fashion. 1% Lidocaine was used for local anesthesia. Under ultrasound guidance a 18 gauge catheter was introduced. Thoracentesis was performed. The catheter was removed and a dressing applied. FINDINGS: A total of approximately 1750 mL of bloody fluid was removed. IMPRESSION: Successful ultrasound guided left thoracentesis yielding 1750 mL of pleural fluid. Electronically Signed   By: Franchot Gallo M.D.   On: 01/08/2017 16:40    I have reviewed the patient's current medications.  Assessment/Plan: Problem #1 hyponatremia:  Thought to be secondary to hypovolemic hyponatremia. Presently she is on free water restriction and normal saline. Her  sodium is 123 and improving. Problem #2 hypokalemia Problem #3 history of hypertension: Her blood pressure is reasonably controlled Problem #4 nausea and vomiting: Possibly secondary to chemotherapy. She didn't have any vomiting but has nausea Problem #5 history of ovarian cancer Problem #6 dementia Plan: We'll change IV fluid to normal saline with 10 mEq of KCl at 75 mL per hour 2] we'll check a renal panel in the morning 3] we will start her on Lasix 20 mg IV once a day.   LOS: 1 day   Autrey Human S 01/09/2017,9:49 AM

## 2017-01-09 NOTE — Progress Notes (Signed)
  PROGRESS NOTE  Denise Bonilla:[id number] DOB: [birth year] DOA: 01/07/2017 PCP: Monico Blitz, MD  Brief Narrative: 83 year old woman recently diagnosed with ovarian cancer, status post first chemotherapy treatment one week ago, presented with 4-5 episodes of vomiting. Found to have hyponatremia. Admitted for hyponatremia, vomiting, acute on chronic diastolic CHF.  Assessment/Plan  Hypovolemic hyponatremia secondary to vomiting and poor oral intake, complicated by chemotherapy. -Slowly improving with IV fluids. [place] nephrology.  Malignant left pleural effusion with symptomatic shortness of breath and tachycardia, much improved status post thoracentesis a/2. -Monitor clinically.  Ovarian cancer, right -Follow up with oncology as an outpatient  SVT -Resolved. Continue beta blocker.  Dementia. -Appears stable.  Vomiting, secondary to chemotherapy. -Resolved.  Overall improving. Anticipate discharge when sodium level has improved further.  DVT prophylaxis: SCDs Code Status: full Family Communication: Daughter again at bedside 8/2 Disposition Plan: home    Murray Hodgkins, MD  [facility] Direct contact: [phone] --[place]ia [place]  --[email or web address]; password [email or web address]  7PM-7AM contact night coverage as above 01/09/2017, 2:28 PM  LOS: 1 day   Consultants:    Procedures:  Large volume left thoracentesis 8/1  Antimicrobials:    Interval history/Subjective: Seems to be doing better today.  Objective: Vitals: 90.7, 18, 63, 160/63, 91% on room air Exam:     Constitutional. Appears calm, comfortable. Appears better today.  Respiratory. Clear to auscultation bilaterally. No wheezes, rales or rhonchi. Normal respiratory effort.  Cardiovascular. Regular rate and rhythm. No murmur, rub or gallop. No lower extremity edema.  I have personally reviewed the following:   Labs:  Sodium 123, potassium 3.4, chloride 88, remainder BMP unremarkable. Magnesium  1.6.  Imaging studies:  Chest x-ray and apparently reviewed from yesterday/1, decreased left pleural effusion.  Medical tests:     Test discussed with performing physician:    Decision to obtain old records:    Review and summation of old records:  Dr. Calton Dach last note 7/23--planning chemotherapy with curative intent.   Scheduled Meds: . bisoprolol  10 mg Oral Daily  . furosemide  20 mg Intravenous Daily  . magnesium oxide  400 mg Oral BID  . multivitamin with minerals  1 tablet Oral Daily  . nystatin  5 mL Oral QID  . simvastatin  20 mg Oral QHS   Continuous Infusions: . 0.9 % sodium chloride with kcl 75 mL/hr at 01/09/17 1106    Principal Problem:   Hyponatremia Active Problems:   Ovarian cancer on right [facility])   Malignant pleural effusion   Vomiting   Dementia   LOS: 1 day

## 2017-01-10 LAB — BASIC METABOLIC PANEL
Anion gap: 8 (ref 5–15)
Anion gap: 8 (ref 5–15)
BUN: 15 mg/dL (ref 6–20)
BUN: 15 mg/dL (ref 6–20)
CALCIUM: 8.3 mg/dL — AB (ref 8.9–10.3)
CALCIUM: 8.5 mg/dL — AB (ref 8.9–10.3)
CHLORIDE: 95 mmol/L — AB (ref 101–111)
CHLORIDE: 92 mmol/L — AB (ref 101–111)
CO2: 23 mmol/L (ref 22–32)
CO2: 25 mmol/L (ref 22–32)
CREATININE: 0.61 mg/dL (ref 0.44–1.00)
Creatinine, Ser: 0.61 mg/dL (ref 0.44–1.00)
GFR calc non Af Amer: >60 mL/min (ref >60)
GFR calc non Af Amer: >60 mL/min (ref >60)
GLUCOSE: 108 mg/dL — AB (ref 65–99)
Glucose, Bld: 86 mg/dL (ref 65–99)
Potassium: 3.8 mmol/L (ref 3.5–5.1)
Potassium: 3.8 mmol/L (ref 3.5–5.1)
SODIUM: 126 mmol/L — AB (ref 135–145)
Sodium: 125 mmol/L — ABNORMAL LOW (ref 135–145)

## 2017-01-10 LAB — RENAL FUNCTION PANEL
Albumin: 2.6 g/dL — ABNORMAL LOW (ref 3.5–5.0)
Anion gap: 7 (ref 5–15)
BUN: 15 mg/dL (ref 6–20)
CALCIUM: 8.5 mg/dL — AB (ref 8.9–10.3)
CO2: 25 mmol/L (ref 22–32)
CREATININE: 0.6 mg/dL (ref 0.44–1.00)
Chloride: 95 mmol/L — ABNORMAL LOW (ref 101–111)
Glucose, Bld: 96 mg/dL (ref 65–99)
Phosphorus: 2.5 mg/dL (ref 2.5–4.6)
Potassium: 3.8 mmol/L (ref 3.5–5.1)
SODIUM: 127 mmol/L — AB (ref 135–145)

## 2017-01-10 MED ORDER — PHENOL 1.4 % MT LIQD
1.0000 | OROMUCOSAL | Status: DC | PRN
  Filled 2017-01-10: qty 177

## 2017-01-10 NOTE — Progress Notes (Signed)
  PROGRESS NOTE  Denise Bonilla AGT:[id number] DOB: 06-02-1930 DOA: 01/07/2017 PCP: Monico Blitz, MD  Brief Narrative: 83 year old woman recently diagnosed with ovarian cancer, status post first chemotherapy treatment one week ago, presented with 4-5 episodes of vomiting. Found to have hyponatremia. Admitted for hyponatremia, vomiting, acute on chronic diastolic CHF.  Assessment/Plan Hypovolemic hyponatremia secondary to vomiting and poor oral intake, complicated by chemotherapy. -Continues to slowly improve with IV fluids. Continue present management. Appreciate nephrology involvement.  Malignant left pleural effusion with symptomatic shortness of breath and tachycardia, much improved status post thoracentesis 8/2. -Remains much improved clinically status post thoracentesis..  Ovarian cancer, right -Follow up with oncology as an outpatient  Dementia. -Remains stable.  Nausea, vomiting, secondary to chemotherapy. -Resolved.  Continues to improve as expected with IV fluids. Anticipate discharge in the next 48 hours once sodium over 1:30.  DVT prophylaxis: SCDs Code Status: full Family Communication: Daughter again at bedside 8/3 Disposition Plan: home    Murray Hodgkins, MD  [facility] Direct contact: [phone] --Via [place]  --[email or web address]; password [email or web address]  7PM-7AM contact night coverage as above 01/10/2017, 12:36 PM  LOS: 2 days   Consultants:    Procedures:  Large volume left thoracentesis 8/1  Antimicrobials:    Interval history/Subjective: Feeling better today. Starting to drink and eat. Breathing better.  Objective: Vitals: Afebrile, 97.7, 20, 66, 1:15/57, 95% on room air Exam:     Constitutional. Appears comfortable, calm. Sitting, eating lunch.  Respiratory. Clear to auscultation bilaterally. No wheezes, rales or rhonchi. Normal respiratory effort.  Cardiovascular. Regular rate and rhythm. No murmur, rub or gallop. 1+ bilateral lower  extremity edema.  Psychiatric. Grossly normal mood and affect. Speech fluent and appropriate.  I have personally reviewed the following:   Labs:  Sodium 127, potassium 3.8, phosphorus 2.5. Remainder BMP unremarkable.  Imaging studies:    Medical tests:     Test discussed with performing physician:    Decision to obtain old records:    Review and summation of old records:  Dr. Calton Dach last note 7/23--planning chemotherapy with curative intent.   Scheduled Meds: . bisoprolol  10 mg Oral Daily  . furosemide  20 mg Intravenous Daily  . magnesium oxide  400 mg Oral BID  . multivitamin with minerals  1 tablet Oral Daily  . nystatin  5 mL Oral QID  . simvastatin  20 mg Oral QHS   Continuous Infusions: . 0.9 % sodium chloride with kcl 50 mL/hr at 01/10/17 0488    Principal Problem:   Hyponatremia Active Problems:   Ovarian cancer on right [facility])   Malignant pleural effusion   Vomiting   Dementia   LOS: 2 days

## 2017-01-10 NOTE — Progress Notes (Signed)
Subjective: Interval History: Patient feels better. Still she has some nausea but tolerates liquid.  Objective: Vital signs in last 24 hours: Temp:  [97.7 F (36.5 C)-98.4 F (36.9 C)] 97.7 F (36.5 C) (08/03 0551) Pulse Rate:  [65-66] 66 (08/03 0551) Resp:  [18-20] 20 (08/03 0551) BP: (115-156)/(57-69) 115/57 (08/03 0551) SpO2:  [95 %-97 %] 95 % (08/03 0551) Weight change:   Intake/Output from previous day: 08/02 0701 - 08/03 0700 In: 1461.3 [I.V.:1461.3] Out: 1200 [Urine:1200] Intake/Output this shift: No intake/output data recorded.  General appearance: alert, cooperative and no distress Resp: clear to auscultation bilaterally Extremities: No edema  Lab Results: No results for input(s): WBC, HGB, HCT, PLT in the last 72 hours. BMET:   Recent Labs  01/09/17 0539 01/10/17 0653  NA 123* 127*  K 3.4* 3.8  CL 88* 95*  CO2 25 25  GLUCOSE 106* 96  BUN 18 15  CREATININE 0.68 0.60  CALCIUM 8.8* 8.5*   No results for input(s): PTH in the last 72 hours. Iron Studies: No results for input(s): IRON, TIBC, TRANSFERRIN, FERRITIN in the last 72 hours.  Studies/Results: Dg Chest 1 View  Result Date: 01/08/2017 CLINICAL DATA:  Post left thoracentesis EXAM: CHEST 1 VIEW COMPARISON:  01/08/2017 FINDINGS: Marked improvement in left pleural effusion. Small residual left pleural effusion with left lower lobe atelectasis. Left perihilar density may be due to atelectasis. Right lower lobe atelectasis. Negative for pneumothorax Port-A-Cath tip in the SVC IMPRESSION: Marked improvement in left pleural effusion following thoracentesis. Negative for pneumothorax. Electronically Signed   By: Franchot Gallo M.D.   On: 01/08/2017 16:39   Dg Chest Port 1 View  Result Date: 01/08/2017 CLINICAL DATA:  Shortness of breath. EXAM: PORTABLE CHEST 1 VIEW COMPARISON:  01/07/2017. FINDINGS: PowerPort catheter noted with lead tip over the superior vena cava. Cardiomegaly with bilateral from interstitial  prominence. CHF cannot be excluded. Large left pleural effusion, increased in size from prior study 01/08/2015. IMPRESSION: 1. PowerPort catheter stable position. 2. Large left pleural effusion, significantly increase in size from prior exam. Underlying atelectasis and or infiltrate cannot be excluded. 3.  Cardiomegaly bilateral interstitial prominence. Electronically Signed   By: Marcello Moores  Register   On: 01/08/2017 14:06   US Thoracentesis Asp Pleural Space W/img Guide  Result Date: 01/08/2017 INDICATION: Short of breath.  Ovarian cancer with large left effusion EXAM: ULTRASOUND GUIDED left THORACENTESIS MEDICATIONS: None. COMPLICATIONS: None immediate. PROCEDURE: An ultrasound guided thoracentesis was thoroughly discussed with the patient and questions answered. The benefits, risks, alternatives and complications were also discussed. The patient understands and wishes to proceed with the procedure. Written consent was obtained. Ultrasound was performed to localize and mark an adequate pocket of fluid in the left chest. The area was then prepped and draped in the normal sterile fashion. 1% Lidocaine was used for local anesthesia. Under ultrasound guidance a 18 gauge catheter was introduced. Thoracentesis was performed. The catheter was removed and a dressing applied. FINDINGS: A total of approximately 1750 mL of bloody fluid was removed. IMPRESSION: Successful ultrasound guided left thoracentesis yielding 1750 mL of pleural fluid. Electronically Signed   By: Franchot Gallo M.D.   On: 01/08/2017 16:40    I have reviewed the patient's current medications.  Assessment/Plan: Problem #1 hyponatremia: Thought to be secondary to hypovolemic hyponatremia. Presently she is on free water restriction and normal saline. Her sodium is 127 progressively improving Problem #2 hypokalemia: On potassium supplement. Her  Potassium is normal Problem #3  hypertension: Her blood  pressure is reasonably controlled Problem #4  nausea and vomiting: Possibly secondary to chemotherapy. She didn't have any vomiting but has nausea Problem #5 history of ovarian cancer Problem #6 dementia Plan: We'll decrease ivf to 50 cc/mr 2] we'll check a renal panel in the morning    LOS: 2 days   Lan Mcneill S 01/10/2017,8:29 AM

## 2017-01-11 DIAGNOSIS — L89151 Pressure ulcer of sacral region, stage 1: Secondary | ICD-10-CM

## 2017-01-11 LAB — RENAL FUNCTION PANEL
Albumin: 2.4 g/dL — ABNORMAL LOW (ref 3.5–5.0)
Anion gap: 6 (ref 5–15)
BUN: 12 mg/dL (ref 6–20)
CHLORIDE: 99 mmol/L — AB (ref 101–111)
CO2: 26 mmol/L (ref 22–32)
Calcium: 8.3 mg/dL — ABNORMAL LOW (ref 8.9–10.3)
Creatinine, Ser: 0.62 mg/dL (ref 0.44–1.00)
GFR calc Af Amer: >60 mL/min (ref >60)
GFR calc non Af Amer: >60 mL/min (ref >60)
GLUCOSE: 94 mg/dL (ref 65–99)
POTASSIUM: 3.8 mmol/L (ref 3.5–5.1)
Phosphorus: 2.8 mg/dL (ref 2.5–4.6)
Sodium: 131 mmol/L — ABNORMAL LOW (ref 135–145)

## 2017-01-11 MED ORDER — NYSTATIN 100000 UNIT/ML MT SUSP: 5.0000 mL | Freq: Four times a day (QID) | OROMUCOSAL | 0 refills | Status: DC

## 2017-01-11 NOTE — Care Management Note (Addendum)
Case Management Note  Patient Details  Name: Denise Bonilla MRN: [id number] Date of Birth: 1930/01/19  Subjective/Objective:                   Action/Plan:   Expected Discharge Date:  01/11/17               Expected Discharge Plan:  [facility]  In-House Referral:     Discharge planning Services  CM Consult  Post Acute Care Choice:  Durable Medical Equipment Choice offered to:  Patient  DME Arranged:  3-N-1 DME Agency:  [facility] Arranged:  RN [facility] Agency:  [facility]  Status of Service:  Completed, signed off  If discussed at [employer] of Stay Meetings, dates discussed:    Additional Comments: CM called pt's room to offer choice and spoke with daughter of pt who lives with her and family chooses [facility] to render [facility] and a 3n1.  This CM has faxed the 3n1 order to [facility] for delivery to pt's home.  Denise Bonilla (daughter) states the best number for [facility] to contact family is [phone].  No other CM needs were communicated. Dellie Catholic, RN 01/11/2017, 4:27 PM

## 2017-01-11 NOTE — Discharge Summary (Signed)
Physician Discharge Summary  Denise Bonilla ZDG:[id number] DOB: 1929-08-24 DOA: 01/07/2017  PCP: Monico Blitz, MD  Admit date: 01/07/2017 Discharge date: 01/11/2017  Recommendations for Outpatient Follow-up:  1. Ongoing care for ovarian cancer 2. Periodic monitoring of malignant left pleural effusion 3. Home health RN for wound care  Follow-up Information    Monico Blitz, MD. Schedule an appointment as soon as possible for a visit in 1 week(s).   Specialty:  Internal Medicine Contact information: [address] [place] [address] [phone]           Discharge Diagnoses:  1. Hypovolemic hyponatremia 2. Malignant left pleural effusion 3. Nausea and vomiting secondary to chemotherapy 4. Right-sided ovarian cancer 5. Dementia without behavioral disturbance 6. Stage I sacral pressure ulcer  Discharge Condition: Improved Disposition: Home with home health  Diet recommendation: Regular  Filed Weights   01/07/17 0153 01/07/17 0620  Weight: 70.8 kg (156 lb) 73 kg (160 lb 15 oz)    History of present illness:  83 year old woman recently diagnosed with ovarian cancer, status post first chemotherapy treatment one week ago, presented with 4-5 episodes of vomiting. Found to have hyponatremia. Admitted for hyponatremia, vomiting, acute on chronic diastolic CHF.  Hospital Course:  Patient was treated with IV fluids with gradual improvement in serum sodium. Nausea and vomiting resolved with supportive care. Malignant pleural effusion dramatically improved after therapeutic thoracentesis. On day of discharge patient was tolerating liquids and Ensure. Care was discussed in detail with daughter at bedside, and the patient requested discharge home. Plan home health and bedside commode. Hospitalization was uncomplicated. Individual issues as below.  Hypovolemic hyponatremia secondary to poor oral intake, vomiting, complicated by chemotherapy. Resolved with IV fluids.  Malignant left pleural  effusion with some dry shortness of breath and tachycardia, much improved status post therapeutic thoracentesis 8/2.  Nausea and vomiting secondary to chemotherapy, resolved.  Right-sided ovarian cancer  Dementia remained stable during hospitalization  Stage I pressure ulcer the sacrum, unknown whether present on admission. -Local wound care. Home health RN. Discussed dressing and pressure relief with daughter at bedside.  Procedures:  Large volume left thoracentesis 8/1   Discharge Instructions  Discharge Instructions    Diet general    Complete by:  As directed    Discharge instructions    Complete by:  As directed    Call your physician or seek immediate medical attention for vomiting, confusion, shortness of breath, poor appetite or worsening of condition.   Increase activity slowly    Complete by:  As directed      Allergies as of 01/11/2017      Reactions   Tetanus Toxoids Anaphylaxis   Codeine Nausea And Vomiting      Medication List    TAKE these medications   aspirin EC 81 MG tablet Take 81 mg by mouth daily.   bisoprolol 10 MG tablet Commonly known as:  ZEBETA Take 10 mg by mouth daily.   dexamethasone 4 MG tablet Commonly known as:  DECADRON 5 tabs the night before and 5 tabs in the morning of chemo, take with food, every 3 weeks   Fish Oil 500 MG Caps Take 1 capsule by mouth daily.   furosemide 20 MG tablet Commonly known as:  LASIX Take 1 tablet (20 mg total) by mouth daily.   lidocaine-prilocaine cream Commonly known as:  EMLA Apply to affected area once   MULTI FOR HER 50+ Tabs Take 1 tablet by mouth daily.   nystatin 100000 UNIT/ML suspension  Commonly known as:  MYCOSTATIN Take 5 mLs (500,000 Units total) by mouth 4 (four) times daily. Stop 48 hours after disappearance of white spots.   ondansetron 8 MG tablet Commonly known as:  ZOFRAN Take three times a day as needed   prochlorperazine 10 MG tablet Commonly known as:   COMPAZINE Take 1 tablet (10 mg total) by mouth every 6 (six) hours as needed (Nausea or vomiting).   simvastatin 20 MG tablet Commonly known as:  ZOCOR Take 20 mg by mouth at bedtime.   Vitamin D 2000 units tablet Take 2,000 Units by mouth daily.            Durable Medical Equipment        Start     Ordered   01/11/17 1546  For home use only DME Bedside commode  Once    Question:  Patient needs a bedside commode to treat with the following condition  Answer:  Generalized weakness   01/11/17 1545     Allergies  Allergen Reactions  . Tetanus Toxoids Anaphylaxis  . Codeine Nausea And Vomiting    The results of significant diagnostics from this hospitalization (including imaging, microbiology, ancillary and laboratory) are listed below for reference.    Significant Diagnostic Studies: Dg Chest 1 View  Result Date: 01/08/2017 CLINICAL DATA:  Post left thoracentesis EXAM: CHEST 1 VIEW COMPARISON:  01/08/2017 FINDINGS: Marked improvement in left pleural effusion. Small residual left pleural effusion with left lower lobe atelectasis. Left perihilar density may be due to atelectasis. Right lower lobe atelectasis. Negative for pneumothorax Port-A-Cath tip in the SVC IMPRESSION: Marked improvement in left pleural effusion following thoracentesis. Negative for pneumothorax. Electronically Signed   By: Franchot Gallo M.D.   On: 01/08/2017 16:39   Dg Chest Port 1 View  Result Date: 01/08/2017 CLINICAL DATA:  Shortness of breath. EXAM: PORTABLE CHEST 1 VIEW COMPARISON:  01/07/2017. FINDINGS: PowerPort catheter noted with lead tip over the superior vena cava. Cardiomegaly with bilateral from interstitial prominence. CHF cannot be excluded. Large left pleural effusion, increased in size from prior study 01/08/2015. IMPRESSION: 1. PowerPort catheter stable position. 2. Large left pleural effusion, significantly increase in size from prior exam. Underlying atelectasis and or infiltrate cannot be  excluded. 3.  Cardiomegaly bilateral interstitial prominence. Electronically Signed   By: Marcello Moores  Register   On: 01/08/2017 14:06   Dg Abdomen Acute W/chest  Result Date: 01/07/2017 CLINICAL DATA:  Nausea and vomiting since last night. Cough. Receiving chemotherapy for ovarian cancer. EXAM: DG ABDOMEN ACUTE W/ 1V CHEST COMPARISON:  CT abdomen pelvis 12/12/2016. Chest radiograph 12/12/2016. FINDINGS: Large left pleural effusion with basilar atelectasis or infiltration similar to previous studies. Right lung is clear. Heart size and pulmonary vascularity are normal. No pneumothorax. Power port type central venous catheter with tip over the low SVC region. Calcification in the mitral valve annulus. Degenerative changes in the shoulders. Thoracolumbar scoliosis convex towards the right. Scattered gas and stool in the colon. No small or large bowel distention. No free intra-abdominal air. No abnormal air-fluid levels. Degenerative changes throughout the spine. Calcified phleboliths in the pelvis. No radiopaque stones. IMPRESSION: Large left pleural effusion with basilar atelectasis or infiltration similar previous studies. Nonobstructive bowel gas pattern. Electronically Signed   By: Lucienne Capers M.D.   On: 01/07/2017 03:25   US Thoracentesis Asp Pleural Space W/img Guide  Result Date: 01/08/2017 INDICATION: Short of breath.  Ovarian cancer with large left effusion EXAM: ULTRASOUND GUIDED left THORACENTESIS MEDICATIONS: None. COMPLICATIONS: None  immediate. PROCEDURE: An ultrasound guided thoracentesis was thoroughly discussed with the patient and questions answered. The benefits, risks, alternatives and complications were also discussed. The patient understands and wishes to proceed with the procedure. Written consent was obtained. Ultrasound was performed to localize and mark an adequate pocket of fluid in the left chest. The area was then prepped and draped in the normal sterile fashion. 1% Lidocaine was  used for local anesthesia. Under ultrasound guidance a 18 gauge catheter was introduced. Thoracentesis was performed. The catheter was removed and a dressing applied. FINDINGS: A total of approximately 1750 mL of bloody fluid was removed. IMPRESSION: Successful ultrasound guided left thoracentesis yielding 1750 mL of pleural fluid. Electronically Signed   By: Franchot Gallo M.D.   On: 01/08/2017 16:40   Labs: Basic Metabolic Panel:  Recent Labs Lab 01/09/17 0539 01/10/17 0653 01/10/17 0748 01/10/17 1038 01/11/17 0610  NA 123* 127* 126* 125* 131*  K 3.4* 3.8 3.8 3.8 3.8  CL 88* 95* 95* 92* 99*  CO2 25 25 23 25 26   GLUCOSE 106* 96 86 108* 94  BUN 18 15 15 15 12   CREATININE 0.68 0.60 0.61 0.61 0.62  CALCIUM 8.8* 8.5* 8.3* 8.5* 8.3*  MG 1.6*  --   --   --   --   PHOS  --  2.5  --   --  2.8   Liver Function Tests:  Recent Labs Lab 01/07/17 0202 01/07/17 0606 01/10/17 0653 01/11/17 0610  AST 32 28  --   --   ALT 30 28  --   --   ALKPHOS 70 65  --   --   BILITOT 1.6* 1.4*  --   --   PROT 6.5 6.3*  --   --   ALBUMIN 3.5 3.4* 2.6* 2.4*    Recent Labs Lab 01/07/17 0202  LIPASE 21   CBC:  Recent Labs Lab 01/07/17 0202 01/07/17 0606  WBC 4.0 4.4  NEUTROABS 2.7  --   HGB 12.5 12.1  HCT 33.7* 32.8*  MCV 86.4 86.8  PLT 165 185    Principal Problem:   Hyponatremia Active Problems:   Ovarian cancer on right (HCC)   Malignant pleural effusion   Vomiting   Dementia   Time coordinating discharge: 40 minutes  Signed:  Murray Hodgkins, MD [facility] 01/11/2017, 3:56 PM

## 2017-01-11 NOTE — Progress Notes (Signed)
  PROGRESS NOTE  HENLI HEY OJJ:[id number] DOB: 10/24/1929 DOA: 01/07/2017 PCP: Monico Blitz, MD  Brief Narrative: 83 year old woman recently diagnosed with ovarian cancer, status post first chemotherapy treatment one week ago, presented with 4-5 episodes of vomiting. Found to have hyponatremia. Admitted for hyponatremia, vomiting, acute on chronic diastolic CHF.  Assessment/Plan Hypovolemic hyponatremia secondary to vomiting and poor oral intake, complicated by chemotherapy. -Continues to improve with IV fluids. [place] [facility]. Plan saline lock at this point. Advance diet. If tolerates diet without nausea. Likely home later today versus a/5.  Malignant left pleural effusion with symptomatic shortness of breath and tachycardia, much improved status post thoracentesis 8/2. -Continues to have good lung sounds on the left. Appears asymptomatic at this point.  Nausea, vomiting, secondary to chemotherapy. -Resolved.  Ovarian cancer, right -Follow up with oncology as an outpatient  Dementia. -Remains stable.  DVT prophylaxis: SCDs Code Status: full Family Communication: Daughter again at bedside 8/4 Disposition Plan: home    Murray Hodgkins, MD  [facility] Direct contact: [phone] --[place] [place]  --[email or web address]; password [email or web address]  7PM-7AM contact night coverage as above 01/11/2017, 12:23 PM  LOS: 3 days   Consultants:    Procedures:  Large volume left thoracentesis 8/1  Antimicrobials:    Interval history/Subjective: Overall feeling well. Poor appetite this morning. No nausea. Breathing fine.  Objective: Vitals: Afebrile, 98.2, 20, 70, 126/60, 97% on room air Exam:     Constitutional. Appears calm, comfortable, sitting in chair.  Cardiovascular. Regular rate and rhythm. No murmur, rub or gallop.  Respiratory. Clear to auscultation bilaterally. No wheezes, rales or rhonchi. Normal respiratory effort.  Psychiatric. Grossly normal mood and  affect. Speech fluent and appropriate.  I have personally reviewed the following:   Labs:  Sodium 131, potassium 3.8, chloride 99. BUN and creatinine within normal limits. Phosphorus within normal limits.  Imaging studies:    Medical tests:     Test discussed with performing physician:    Decision to obtain old records:    Review and summation of old records:  Dr. Calton Dach last note 7/23--planning chemotherapy with curative intent.   Scheduled Meds: . bisoprolol  10 mg Oral Daily  . magnesium oxide  400 mg Oral BID  . multivitamin with minerals  1 tablet Oral Daily  . nystatin  5 mL Oral QID  . simvastatin  20 mg Oral QHS   Continuous Infusions:   Principal Problem:   Hyponatremia Active Problems:   Ovarian cancer on right (HCC)   Malignant pleural effusion   Vomiting   Dementia   LOS: 3 days

## 2017-01-11 NOTE — Progress Notes (Signed)
Subjective: Interval History: Patient has some nausea but no vomiting. She seems to be tolerating some peeling.  Objective: Vital signs in last 24 hours: Temp:  [98.2 F (36.8 C)-98.3 F (36.8 C)] 98.2 F (36.8 C) (08/04 0459) Pulse Rate:  [70-73] 70 (08/04 0459) Resp:  [20] 20 (08/04 0459) BP: (111-126)/(42-60) 126/60 (08/04 0459) SpO2:  [95 %-98 %] 97 % (08/04 0459) Weight change:   Intake/Output from previous day: 08/03 0701 - 08/04 0700 In: 794.6 [P.O.:240; I.V.:554.6] Out: 1200 [Urine:1200] Intake/Output this shift: No intake/output data recorded.  Generally patient is alert and in no apparent distress Chest: Clear to auscultation Heart exam regular rate and rhythm Extremities she has trace edema  Lab Results: No results for input(s): WBC, HGB, HCT, PLT in the last 72 hours. BMET:   Recent Labs  01/10/17 1038 01/11/17 0610  NA 125* 131*  K 3.8 3.8  CL 92* 99*  CO2 25 26  GLUCOSE 108* 94  BUN 15 12  CREATININE 0.61 0.62  CALCIUM 8.5* 8.3*   No results for input(s): PTH in the last 72 hours. Iron Studies: No results for input(s): IRON, TIBC, TRANSFERRIN, FERRITIN in the last 72 hours.  Studies/Results: No results found.  I have reviewed the patient's current medications.  Assessment/Plan: Problem #1 hyponatremia: Thought to be secondary to hypovolemic hyponatremia. Her sodium today is 131 has improved significantly. Problem #2 hypokalemia: On potassium supplement. Her  Potassium remains normal Problem #3  hypertension: Her blood pressure is reasonably controlled Problem #4 nausea and vomiting: Possibly secondary to chemotherapy. Still she has some nausea but no vomiting. Problem #5 history of ovarian cancer Problem #6 dementia Plan: 1]DC IV fluid 2] since her hyponatremia has improved and the sodium is at reasonable level I will sign off. Thank you for letting me paticpate in her care.    LOS: 3 days   Addisynn Vassell S 01/11/2017,8:13 AM

## 2017-01-11 NOTE — Progress Notes (Signed)
Patient was only able to eat a couple bites of food for lunch. Patient has no appetite, states she enjoys Ensure.

## 2017-01-12 NOTE — Progress Notes (Signed)
Patient Port De-accessed, telemetry removed, tolerated well. Patient and family given discharge instructions at bedside. Family taught basic wound care for sacrum.

## 2017-01-20 ENCOUNTER — Encounter: Payer: Self-pay | Admitting: Cardiology

## 2017-01-20 NOTE — Progress Notes (Signed)
Cardiology Office Note  Date: 01/21/2017   ID: Marquita Palms, DOB 1930/04/22, MRN [id number]  PCP: Monico Blitz, MD  Consulting Cardiologist: Rozann Lesches, MD   Chief Complaint  Patient presents with  . Question of heart failure    History of Present Illness: Denise Bonilla is an 83 y.o. female referred for cardiology consultation by Dr. Manuella Ghazi for evaluation of reported heart failure. I reviewed her records. She has ovarian cancer with recent initiation of chemotherapy and hospitalization secondary to recurring nausea and emesis. She was found to have a large left pleural effusion, malignant and requiring thoracentesis - cell analysis consistent with metastatic adenocarcinoma. Echocardiogram reveals LVEF 65-70% as outlined below.  She is here today with her daughter. My understanding is that she has follow-up tomorrow with oncology for discussion about second round of chemotherapy. She is followed in the oncology clinic by Dr. Alvy Bimler. Current treatment plan includes carboplatin and Taxol.  She does not have any history of cardiomyopathy, we discussed the results of her recent echocardiogram which were overall reassuring. As noted above, the patient's pleural effusions were not cardiogenic and consistent with metastatic adenocarcinoma.  Past Medical History:  Diagnosis Date  . Dementia   . Hyperlipidemia   . Hypertension   . Malignant pleural effusion    Left  . Ovarian cancer on right [facility]) 12/23/2016    Past Surgical History:  Procedure Laterality Date  . CYSTECTOMY    . IR FLUORO GUIDE PORT INSERTION RIGHT  06-22-2016  . IR US GUIDE VASC ACCESS RIGHT  06-22-2016    Current Outpatient Prescriptions  Medication Sig Dispense Refill  . aspirin EC 81 MG tablet Take 81 mg by mouth daily.    . bisoprolol (ZEBETA) 10 MG tablet Take 10 mg by mouth daily.    . Cholecalciferol (VITAMIN D) 2000 units tablet Take 2,000 Units by mouth daily.    Marland Kitchen dexamethasone (DECADRON) 4 MG tablet  5 tabs the night before and 5 tabs in the morning of chemo, take with food, every 3 weeks 30 tablet 1  . furosemide (LASIX) 20 MG tablet Take 1 tablet (20 mg total) by mouth daily. 30 tablet 0  . lidocaine-prilocaine (EMLA) cream Apply to affected area once 30 g 3  . Multiple Vitamins-Minerals (MULTI FOR HER 50+) TABS Take 1 tablet by mouth daily.    Marland Kitchen nystatin (MYCOSTATIN) 100000 UNIT/ML suspension Take 5 mLs (500,000 Units total) by mouth 4 (four) times daily. Stop 48 hours after disappearance of white spots. 473 mL 0  . Omega-3 Fatty Acids (FISH OIL) 500 MG CAPS Take 1 capsule by mouth daily.    . ondansetron (ZOFRAN) 8 MG tablet Take three times a day as needed 30 tablet 1  . prochlorperazine (COMPAZINE) 10 MG tablet Take 1 tablet (10 mg total) by mouth every 6 (six) hours as needed (Nausea or vomiting). 30 tablet 1  . simvastatin (ZOCOR) 20 MG tablet Take 20 mg by mouth at bedtime.      No current facility-administered medications for this visit.    Allergies:  Tetanus toxoids and Codeine   Social History: The patient  reports that she has never smoked. She has never used smokeless tobacco. She reports that she does not drink alcohol or use drugs.   Family History: The patient's family history includes Breast cancer in her sister; Cancer in her other; Heart failure in her mother; Hyperlipidemia in her other; Hypertension in her other.   ROS:  Please see the  history of present illness. Otherwise, complete review of systems is positive for poor appetite and fatigue.  All other systems are reviewed and negative.   Physical Exam: VS:  BP 118/70   Pulse 77   Ht 5' (1.524 m)   Wt 144 lb 9.6 oz (65.6 kg)   SpO2 98%   BMI 28.24 kg/m , BMI Body mass index is 28.24 kg/m.  Wt Readings from Last 3 Encounters:  01/21/17 144 lb 9.6 oz (65.6 kg)  01/07/17 160 lb 15 oz (73 kg)  12/30/16 156 lb 9.6 oz (71 kg)    General: Frail-appearing elderly woman in no distress. HEENT: Conjunctiva and  lids normal, oropharynx clear. Neck: Supple, no elevated JVP or carotid bruits, no thyromegaly. Lungs: Diminished breath sounds at both bases, nonlabored breathing at rest. Cardiac: Regular rate and rhythm, no S3, soft systolic murmur, no pericardial rub. Abdomen: Soft, nontender, bowel sounds present. Extremities: Mild lower leg edema, distal pulses 1-2+. Skin: Warm and dry. Musculoskeletal: Kyphosis present. Neuropsychiatric: Alert and oriented x3, affect grossly appropriate.  ECG: I personally reviewed the tracing from 12/28/2016 which showed sinus rhythm with short PR interval.  Recent Labwork: 12/07/2016: B Natriuretic Peptide 131.2 12/08/2016: TSH 2.523 01/07/2017: ALT 28; AST 28; Hemoglobin 12.1; Platelets 185 01/09/2017: Magnesium 1.6 01/11/2017: BUN 12; Creatinine, Ser 0.62; Potassium 3.8; Sodium 131   Other Studies Reviewed Today:  Echocardiogram 12/08/2016: Study Conclusions  - Left ventricle: The cavity size was normal. Wall thickness was   increased in a pattern of mild LVH. Systolic function was   vigorous. The estimated ejection fraction was in the range of 65%   to 70%. Wall motion was normal; there were no regional wall   motion abnormalities. Doppler parameters are consistent with   abnormal left ventricular relaxation (grade 1 diastolic   dysfunction). - Aortic valve: Mildly calcified annulus. Trileaflet. There was   trivial regurgitation. - Mitral valve: Moderately calcified annulus. There was mild   regurgitation. - Right atrium: Central venous pressure (est): 3 mm Hg. - Tricuspid valve: There was trivial regurgitation. - Pulmonary arteries: PA peak pressure: 27 mm Hg (S). - Pericardium, extracardiac: There was no pericardial effusion.  Impressions:  - Mild LVH with LVEF 65-70% and grade 1 diastolic dysfunction.   Moderately calcified mitral annulus with mild mitral   regurgitation. Trivial aortic regurgitation. Trivial tricuspid   regurgitation with PASP 27  mmHg.  Chest x-ray 01/08/2017: FINDINGS: Marked improvement in left pleural effusion. Small residual left pleural effusion with left lower lobe atelectasis. Left perihilar density may be due to atelectasis. Right lower lobe atelectasis.  Negative for pneumothorax  Port-A-Cath tip in the SVC  IMPRESSION: Marked improvement in left pleural effusion following thoracentesis. Negative for pneumothorax.  Assessment and Plan:  1. Patient with recently documented large left pleural effusion, found to be malignant and consistent with metastatic adenocarcinoma based on the thoracentesis. Patient referred by Dr. Manuella Ghazi with concerns about heart failure based on records received, however this effusion does not look to be cardiogenic, and in fact her echocardiogram shows LVEF 65-70% with only mild diastolic dysfunction. She remains at risk for recurring pleural effusions in light of her malignancy, however I do not think that placing her on standing diuretics would be of much benefit and may in fact serve to exacerbate electrolyte abnormalities which she has already shown in the setting of her recent chemotherapy treatment.  2. Ovarian cancer with recently documented malignant pleural effusion. She is following in the oncology clinic with Dr.  Alvy Bimler and has anticipated treatment course including carboplatin and Taxol. Whether or not subsequent surgical intervention is being considered at this point is not certain.  3. Essential hypertension by history, blood pressure is normal today on current regimen. Keep follow-up with Dr. Manuella Ghazi.  4. Hyperlipidemia, on Zocor. She follows with Dr. Manuella Ghazi.  Current medicines were reviewed with the patient today.  Disposition: Keep follow-up with PCP and Oncology.  Signed, Satira Sark, MD, [facility] 01/21/2017 2:12 PM    [facility] [place], [place], [place] [address] Phone: [phone]; Fax: [phone]

## 2017-01-21 ENCOUNTER — Ambulatory Visit (INDEPENDENT_AMBULATORY_CARE_PROVIDER_SITE_OTHER): Payer: Medicare Other | Admitting: Cardiology

## 2017-01-21 ENCOUNTER — Encounter: Payer: Self-pay | Admitting: Cardiology

## 2017-01-21 VITALS — BP 118/70 | HR 77 | Ht 60.0 in | Wt 144.6 lb

## 2017-01-21 DIAGNOSIS — I10 Essential (primary) hypertension: Secondary | ICD-10-CM | POA: Diagnosis not present

## 2017-01-21 DIAGNOSIS — J910 Malignant pleural effusion: Secondary | ICD-10-CM

## 2017-01-21 DIAGNOSIS — E782 Mixed hyperlipidemia: Secondary | ICD-10-CM | POA: Diagnosis not present

## 2017-01-21 DIAGNOSIS — C569 Malignant neoplasm of unspecified ovary: Secondary | ICD-10-CM | POA: Diagnosis not present

## 2017-01-21 NOTE — Patient Instructions (Signed)
Medication Instructions:  Your physician recommends that you continue on your current medications as directed. Please refer to the Current Medication list given to you today.  Labwork: NONE  Testing/Procedures: NONE  Follow-Up: Your physician recommends that you schedule a follow-up appointment AS NEEDED WITH DR. MCDOWELL  Any Other Special Instructions Will Be Listed Below (If Applicable).  If you need a refill on your cardiac medications before your next appointment, please call your pharmacy. 

## 2017-01-22 ENCOUNTER — Telehealth: Payer: Self-pay

## 2017-01-22 ENCOUNTER — Other Ambulatory Visit (HOSPITAL_BASED_OUTPATIENT_CLINIC_OR_DEPARTMENT_OTHER): Payer: Medicare Other

## 2017-01-22 ENCOUNTER — Ambulatory Visit (HOSPITAL_BASED_OUTPATIENT_CLINIC_OR_DEPARTMENT_OTHER): Payer: Medicare Other

## 2017-01-22 ENCOUNTER — Ambulatory Visit (HOSPITAL_BASED_OUTPATIENT_CLINIC_OR_DEPARTMENT_OTHER): Payer: Medicare Other | Admitting: Hematology and Oncology

## 2017-01-22 ENCOUNTER — Ambulatory Visit: Payer: Medicare Other

## 2017-01-22 DIAGNOSIS — Z5111 Encounter for antineoplastic chemotherapy: Secondary | ICD-10-CM | POA: Diagnosis not present

## 2017-01-22 DIAGNOSIS — R634 Abnormal weight loss: Secondary | ICD-10-CM | POA: Diagnosis not present

## 2017-01-22 DIAGNOSIS — C561 Malignant neoplasm of right ovary: Secondary | ICD-10-CM | POA: Diagnosis not present

## 2017-01-22 DIAGNOSIS — J910 Malignant pleural effusion: Secondary | ICD-10-CM | POA: Diagnosis not present

## 2017-01-22 DIAGNOSIS — R110 Nausea: Secondary | ICD-10-CM | POA: Diagnosis not present

## 2017-01-22 DIAGNOSIS — T451X5A Adverse effect of antineoplastic and immunosuppressive drugs, initial encounter: Secondary | ICD-10-CM

## 2017-01-22 DIAGNOSIS — Z95828 Presence of other vascular implants and grafts: Secondary | ICD-10-CM | POA: Insufficient documentation

## 2017-01-22 LAB — COMPREHENSIVE METABOLIC PANEL
ALT: 19 U/L (ref 0–55)
AST: 17 U/L (ref 5–34)
Albumin: 3.2 g/dL — ABNORMAL LOW (ref 3.5–5.0)
Alkaline Phosphatase: 104 U/L (ref 40–150)
Anion Gap: 10 mEq/L (ref 3–11)
BUN: 17 mg/dL (ref 7.0–26.0)
CO2: 26 mEq/L (ref 22–29)
Calcium: 10.2 mg/dL (ref 8.4–10.4)
Chloride: 98 mEq/L (ref 98–109)
Creatinine: 0.8 mg/dL (ref 0.6–1.1)
EGFR: 65 mL/min/{1.73_m2} — ABNORMAL LOW (ref >90)
Glucose: 206 mg/dl — ABNORMAL HIGH (ref 70–140)
Potassium: 3.7 mEq/L (ref 3.5–5.1)
Sodium: 135 mEq/L — ABNORMAL LOW (ref 136–145)
Total Bilirubin: 0.43 mg/dL (ref 0.20–1.20)
Total Protein: 6.7 g/dL (ref 6.4–8.3)

## 2017-01-22 LAB — CBC WITH DIFFERENTIAL/PLATELET
BASO%: 0.4 % (ref 0.0–2.0)
Basophils Absolute: 0 10*3/uL (ref 0.0–0.1)
EOS ABS: 0 10*3/uL (ref 0.0–0.5)
EOS%: 0 % (ref 0.0–7.0)
HCT: 31.4 % — ABNORMAL LOW (ref 34.8–46.6)
HGB: 11 g/dL — ABNORMAL LOW (ref 11.6–15.9)
LYMPH%: 11.2 % — AB (ref 14.0–49.7)
MCH: 32.3 pg (ref 25.1–34.0)
MCHC: 34.9 g/dL (ref 31.5–36.0)
MCV: 92.5 fL (ref 79.5–101.0)
MONO#: 0.1 10*3/uL (ref 0.1–0.9)
MONO%: 1.1 % (ref 0.0–14.0)
NEUT#: 9.4 10*3/uL — ABNORMAL HIGH (ref 1.5–6.5)
NEUT%: 87.3 % — AB (ref 38.4–76.8)
PLATELETS: 385 10*3/uL (ref 145–400)
RBC: 3.4 10*6/uL — AB (ref 3.70–5.45)
RDW: 15 % — ABNORMAL HIGH (ref 11.2–14.5)
WBC: 10.7 10*3/uL — ABNORMAL HIGH (ref 3.9–10.3)
lymph#: 1.2 10*3/uL (ref 0.9–3.3)

## 2017-01-22 MED ORDER — HEPARIN SOD (PORK) LOCK FLUSH 100 UNIT/ML IV SOLN
500.0000 [IU] | Freq: Once | INTRAVENOUS | Status: AC | PRN
  Administered 2017-01-22: 500 [IU]
  Filled 2017-01-22: qty 5

## 2017-01-22 MED ORDER — SODIUM CHLORIDE 0.9% FLUSH
10.0000 mL | INTRAVENOUS | Status: DC | PRN
  Administered 2017-01-22: 10 mL
  Filled 2017-01-22: qty 10

## 2017-01-22 MED ORDER — FAMOTIDINE IN NACL 20-0.9 MG/50ML-% IV SOLN
20.0000 mg | Freq: Once | INTRAVENOUS | Status: AC
  Administered 2017-01-22: 20 mg via INTRAVENOUS

## 2017-01-22 MED ORDER — PALONOSETRON HCL INJECTION 0.25 MG/5ML
0.2500 mg | Freq: Once | INTRAVENOUS | Status: AC
  Administered 2017-01-22: 0.25 mg via INTRAVENOUS

## 2017-01-22 MED ORDER — SODIUM CHLORIDE 0.9 % IV SOLN
Freq: Once | INTRAVENOUS | Status: AC
  Administered 2017-01-22: 11:00:00 via INTRAVENOUS

## 2017-01-22 MED ORDER — PACLITAXEL CHEMO INJECTION 300 MG/50ML
131.2500 mg/m2 | Freq: Once | INTRAVENOUS | Status: AC
  Administered 2017-01-22: 228 mg via INTRAVENOUS
  Filled 2017-01-22: qty 38

## 2017-01-22 MED ORDER — PALONOSETRON HCL INJECTION 0.25 MG/5ML
INTRAVENOUS | Status: AC
  Filled 2017-01-22: qty 5

## 2017-01-22 MED ORDER — DIPHENHYDRAMINE HCL 50 MG/ML IJ SOLN
INTRAMUSCULAR | Status: AC
  Filled 2017-01-22: qty 1

## 2017-01-22 MED ORDER — FAMOTIDINE IN NACL 20-0.9 MG/50ML-% IV SOLN
INTRAVENOUS | Status: AC
  Filled 2017-01-22: qty 50

## 2017-01-22 MED ORDER — SODIUM CHLORIDE 0.9% FLUSH
10.0000 mL | Freq: Once | INTRAVENOUS | Status: AC
  Administered 2017-01-22: 10 mL
  Filled 2017-01-22: qty 10

## 2017-01-22 MED ORDER — DIPHENHYDRAMINE HCL 50 MG/ML IJ SOLN
50.0000 mg | Freq: Once | INTRAMUSCULAR | Status: AC
  Administered 2017-01-22: 50 mg via INTRAVENOUS

## 2017-01-22 MED ORDER — CARBOPLATIN CHEMO INJECTION 450 MG/45ML
317.7000 mg | Freq: Once | INTRAVENOUS | Status: AC
  Administered 2017-01-22: 320 mg via INTRAVENOUS
  Filled 2017-01-22: qty 32

## 2017-01-22 MED ORDER — SODIUM CHLORIDE 0.9 % IV SOLN
Freq: Once | INTRAVENOUS | Status: AC
  Administered 2017-01-22: 11:00:00 via INTRAVENOUS
  Filled 2017-01-22: qty 5

## 2017-01-22 NOTE — Patient Instructions (Signed)
[facility] Discharge Instructions for Patients Receiving Chemotherapy  Today you received the following chemotherapy agents Taxol and Carboplatin. To help prevent nausea and vomiting after your treatment, we encourage you to take your nausea medication as directed.  If you develop nausea and vomiting that is not controlled by your nausea medication, call the clinic.   BELOW ARE SYMPTOMS THAT SHOULD BE REPORTED IMMEDIATELY:  *FEVER GREATER THAN 100.5 F  *CHILLS WITH OR WITHOUT FEVER  NAUSEA AND VOMITING THAT IS NOT CONTROLLED WITH YOUR NAUSEA MEDICATION  *UNUSUAL SHORTNESS OF BREATH  *UNUSUAL BRUISING OR BLEEDING  TENDERNESS IN MOUTH AND THROAT WITH OR WITHOUT PRESENCE OF ULCERS  *URINARY PROBLEMS  *BOWEL PROBLEMS  UNUSUAL RASH Items with * indicate a potential emergency and should be followed up as soon as possible.  Feel free to call the clinic you have any questions or concerns. The clinic phone number is [phone].  Please show the CHEMO ALERT CARD at check-in to the Emergency Department and triage nurse.    

## 2017-01-22 NOTE — Telephone Encounter (Signed)
appts made per 8/15 los and patient will get an avs when finished with chemo today  Denise Bonilla

## 2017-01-23 ENCOUNTER — Encounter: Payer: Self-pay | Admitting: Hematology and Oncology

## 2017-01-23 DIAGNOSIS — R634 Abnormal weight loss: Secondary | ICD-10-CM | POA: Insufficient documentation

## 2017-01-23 DIAGNOSIS — T451X5A Adverse effect of antineoplastic and immunosuppressive drugs, initial encounter: Secondary | ICD-10-CM

## 2017-01-23 DIAGNOSIS — R110 Nausea: Secondary | ICD-10-CM | POA: Insufficient documentation

## 2017-01-23 NOTE — Assessment & Plan Note (Signed)
She had recent recurrent thoracentesis Clinical examination today satisfactory We will monitor closely

## 2017-01-23 NOTE — Assessment & Plan Note (Signed)
She had recent profound weight loss due to side effects of treatment I will reduce dose of chemotherapy as above I explained to the patient and family members the rationale behind dose adjustment.

## 2017-01-23 NOTE — Assessment & Plan Note (Signed)
She had recent severe chemotherapy induced nausea I recommend IV fluid supportive therapy after each cycle of treatment and she agreed

## 2017-01-23 NOTE — Assessment & Plan Note (Signed)
She tolerated cycle 1 of treatment very poorly I recommend we proceed with cycle 2 with dose adjustment I also recommend IV fluid hydration therapy when she returns for Neulasta injection and she agreed

## 2017-01-23 NOTE — Progress Notes (Signed)
[place] OFFICE PROGRESS NOTE  Patient Care Team: Monico Blitz, MD as PCP - General (Internal Medicine)  SUMMARY OF ONCOLOGIC HISTORY:   Ovarian cancer on right [facility])   12/07/2016 Imaging    Ct angiogram:  No evidence of pulmonary embolus.  Large left and small right pleural effusions. Complete collapse of the left lower lobe, partial collapse of the lingula and milder atelectasis of the left upper lobe, likely due to compressive atelectasis. The collapsed lung parenchyma is poorly evaluated, but no obvious pulmonary masses are seen.  Interstitial pulmonary edema.  Small volume abdominal ascites.  Heterogeneous appearance of the thyroid gland, with right sided nodules.  Aortic Atherosclerosis (ICD10-I70.0).      12/07/2016 - 12/13/2016 Hospital Admission    She was admitted for generalized malaise, abnormal blood work, large bilateral pleural effusion and possible right ovarian cancer      12/08/2016 Imaging    LV EF: 65% -   70%      12/08/2016 Pathology Results    PLEURAL FLUID, LEFT (SPECIMEN 1 OF 1, COLLECTED ON 12/08/16): - MALIGNANT CELLS CONSISTENT WITH METASTATIC ADENOCARCINOMA, SEE COMMENT      12/08/2016 Procedure    Successful ultrasound guided left thoracentesis yielding 1.6 L of pleural fluid. The procedure was terminated early secondary to significant coughing.      12/12/2016 Imaging    CT scan of abdomen and pelvis 1. Septated right adnexal cyst measuring 8.5 x 6.5 cm, concerning for ovarian malignancy in this setting. Small volume complex intra-abdominal ascites which tracks in the pericolic gutters and anterior omentum. No definite soft tissue omental caking. 2. Endometrial thickening versus fluid in the endometrial canal, nonspecific. Rounded 3.9 cm lesion in the uterine fundus may be a fibroid, however would be unusual for patient's age. Endometrial/ uterine neoplasm is considered. 3. Nonspecific ill-defined low-density lesion in the right lobe  of the liver. Probable hepatic hemangioma at the dome. 4. Further evaluation of the GYN structures could be performed with ultrasound. MRI may provide further detail if needed for potential surgical planning. 5. Aortic atherosclerosis      12/12/2016 Imaging    CXR 1. Progressive left base atelectasis and infiltrate. Small left pleural effusion again noted. 2.  Cardiomegaly, no evidence of pulmonary venous congestion.      12/13/2016 Tumor Marker    Patient's tumor was tested for the following markers: CA-125. Results of the tumor marker test revealed 1310.      Jul 28, 202018 Procedure    Ultrasound and fluoroscopically guided right internal jugular single lumen power port catheter insertion. Tip in the SVC/RA junction. Catheter ready for use.      01/01/2017 -  Chemotherapy    She received carboplatin and Taxol      01/07/2017 - 01/11/2017 Hospital Admission    She was admitted for severe nausea/vomiting and pleural effusion.      01/08/2017 Procedure    Successful ultrasound guided left thoracentesis yielding 1750 mL of pleural fluid      01/08/2017 Imaging    Marked improvement in left pleural effusion following thoracentesis. Negative for pneumothorax       INTERVAL HISTORY: Please see below for problem oriented charting. She returns for cycle 2 of treatment With cycle 1 of treatment, she developed severe nausea and vomiting and was hospitalized for several days She also have recurrent pleural effusion requiring thoracentesis Since then, she is feeling better She have lost 15 pounds since her last visit Her appetite is poor but is  improving She denies abdominal bloating, pain or constipation Denies peripheral neuropathy from prior treatment No recent cough, chest pain or shortness of breath  REVIEW OF SYSTEMS:   Constitutional: Denies fevers, chills  Eyes: Denies blurriness of vision Ears, nose, mouth, throat, and face: Denies mucositis or sore throat Respiratory: Denies  cough, dyspnea or wheezes Cardiovascular: Denies palpitation, chest discomfort or lower extremity swelling Skin: Denies abnormal skin rashes Lymphatics: Denies new lymphadenopathy or easy bruising Neurological:Denies numbness, tingling or new weaknesses Behavioral/Psych: Mood is stable, no new changes  All other systems were reviewed with the patient and are negative.  I have reviewed the past medical history, past surgical history, social history and family history with the patient and they are unchanged from previous note.  ALLERGIES:  is allergic to tetanus toxoids and codeine.  MEDICATIONS:  Current Outpatient Prescriptions  Medication Sig Dispense Refill  . aspirin EC 81 MG tablet Take 81 mg by mouth daily.    . bisoprolol (ZEBETA) 10 MG tablet Take 10 mg by mouth daily.    . Cholecalciferol (VITAMIN D) 2000 units tablet Take 2,000 Units by mouth daily.    Marland Kitchen dexamethasone (DECADRON) 4 MG tablet 5 tabs the night before and 5 tabs in the morning of chemo, take with food, every 3 weeks 30 tablet 1  . furosemide (LASIX) 20 MG tablet Take 1 tablet (20 mg total) by mouth daily. 30 tablet 0  . lidocaine-prilocaine (EMLA) cream Apply to affected area once 30 g 3  . Multiple Vitamins-Minerals (MULTI FOR HER 50+) TABS Take 1 tablet by mouth daily.    Marland Kitchen nystatin (MYCOSTATIN) 100000 UNIT/ML suspension Take 5 mLs (500,000 Units total) by mouth 4 (four) times daily. Stop 48 hours after disappearance of white spots. 473 mL 0  . Omega-3 Fatty Acids (FISH OIL) 500 MG CAPS Take 1 capsule by mouth daily.    . ondansetron (ZOFRAN) 8 MG tablet Take three times a day as needed 30 tablet 1  . prochlorperazine (COMPAZINE) 10 MG tablet Take 1 tablet (10 mg total) by mouth every 6 (six) hours as needed (Nausea or vomiting). 30 tablet 1  . simvastatin (ZOCOR) 20 MG tablet Take 20 mg by mouth at bedtime.      No current facility-administered medications for this visit.     PHYSICAL EXAMINATION: ECOG  PERFORMANCE STATUS: 2 - Symptomatic, <50% confined to bed  Vitals:   01/22/17 0945  BP: (!) 113/51  Pulse: 75  Resp: 18  Temp: 98 F (36.7 C)  SpO2: 99%   Filed Weights   01/22/17 0945  Weight: 145 lb 4.8 oz (65.9 kg)    GENERAL:alert, no distress and comfortable.  She looks frail SKIN: skin color, texture, turgor are normal, no rashes or significant lesions EYES: normal, Conjunctiva are pink and non-injected, sclera clear OROPHARYNX:no exudate, no erythema and lips, buccal mucosa, and tongue normal  NECK: supple, thyroid normal size, non-tender, without nodularity LYMPH:  no palpable lymphadenopathy in the cervical, axillary or inguinal LUNGS: clear to auscultation and percussion with normal breathing effort HEART: regular rate & rhythm and no murmurs and no lower extremity edema ABDOMEN:abdomen soft, non-tender and normal bowel sounds Musculoskeletal:no cyanosis of digits and no clubbing  NEURO: alert & oriented x 3 with fluent speech, no focal motor/sensory deficits  LABORATORY DATA:  I have reviewed the data as listed    Component Value Date/Time   NA 135 (L) 01/22/2017 0922   K 3.7 01/22/2017 0922   CL 99 (L)  01/11/2017 0610   CO2 26 01/22/2017 0922   GLUCOSE 206 (H) 01/22/2017 0922   BUN 17.0 01/22/2017 0922   CREATININE 0.8 01/22/2017 0922   CALCIUM 10.2 01/22/2017 0922   PROT 6.7 01/22/2017 0922   ALBUMIN 3.2 (L) 01/22/2017 0922   AST 17 01/22/2017 0922   ALT 19 01/22/2017 0922   ALKPHOS 104 01/22/2017 0922   BILITOT 0.43 01/22/2017 0922   GFRNONAA >60 01/11/2017 0610   GFRAA >60 01/11/2017 0610    No results found for: SPEP, UPEP  Lab Results  Component Value Date   WBC 10.7 (H) 01/22/2017   NEUTROABS 9.4 (H) 01/22/2017   HGB 11.0 (L) 01/22/2017   HCT 31.4 (L) 01/22/2017   MCV 92.5 01/22/2017   PLT 385 01/22/2017      Chemistry      Component Value Date/Time   NA 135 (L) 01/22/2017 0922   K 3.7 01/22/2017 0922   CL 99 (L) 01/11/2017 0610    CO2 26 01/22/2017 0922   BUN 17.0 01/22/2017 0922   CREATININE 0.8 01/22/2017 0922      Component Value Date/Time   CALCIUM 10.2 01/22/2017 0922   ALKPHOS 104 01/22/2017 0922   AST 17 01/22/2017 0922   ALT 19 01/22/2017 0922   BILITOT 0.43 01/22/2017 0922       RADIOGRAPHIC STUDIES: I have personally reviewed the radiological images as listed and agreed with the findings in the report. Dg Chest 1 View  Result Date: 01/08/2017 CLINICAL DATA:  Post left thoracentesis EXAM: CHEST 1 VIEW COMPARISON:  01/08/2017 FINDINGS: Marked improvement in left pleural effusion. Small residual left pleural effusion with left lower lobe atelectasis. Left perihilar density may be due to atelectasis. Right lower lobe atelectasis. Negative for pneumothorax Port-A-Cath tip in the SVC IMPRESSION: Marked improvement in left pleural effusion following thoracentesis. Negative for pneumothorax. Electronically Signed   By: Franchot Gallo M.D.   On: 01/08/2017 16:39   Ir US Guide Vasc Access Right  Result Date: 2020-11-1916 CLINICAL DATA:  Ovarian carcinoma, access for chemotherapy EXAM: RIGHT INTERNAL JUGULAR SINGLE LUMEN POWER PORT CATHETER INSERTION Date:  2020-06-19182020-11-1916 9:52 am Radiologist:  M. Daryll Brod, MD Guidance:  Ultrasound and fluoroscopic MEDICATIONS: Ancef 2 g; The antibiotic was administered within an appropriate time interval prior to skin puncture. ANESTHESIA/SEDATION: Versed 2.0 mg IV; Fentanyl 100 mcg IV; Moderate Sedation Time:  36 minutes The patient was continuously monitored during the procedure by the interventional radiology nurse under my direct supervision. FLUOROSCOPY TIME:  42 seconds (2 mGy) COMPLICATIONS: None immediate. CONTRAST:  None. PROCEDURE: Informed consent was obtained from the patient following explanation of the procedure, risks, benefits and alternatives. The patient understands, agrees and consents for the procedure. All questions were addressed. A time out was performed.  Maximal barrier sterile technique utilized including caps, mask, sterile gowns, sterile gloves, large sterile drape, hand hygiene, and 2% chlorhexidine scrub. Under sterile conditions and local anesthesia, right internal jugular micropuncture venous access was performed. Access was performed with ultrasound. Images were obtained for documentation. A guide wire was inserted followed by a transitional dilator. This allowed insertion of a guide wire and catheter into the IVC. Measurements were obtained from the SVC / RA junction back to the right IJ venotomy site. In the right infraclavicular chest, a subcutaneous pocket was created over the second anterior rib. This was done under sterile conditions and local anesthesia. 1% lidocaine with epinephrine was utilized for this. A 2.5 cm incision was made in the skin.  Blunt dissection was performed to create a subcutaneous pocket over the right pectoralis major muscle. The pocket was flushed with saline vigorously. There was adequate hemostasis. The port catheter was assembled and checked for leakage. The port catheter was secured in the pocket with two retention sutures. The tubing was tunneled subcutaneously to the right venotomy site and inserted into the SVC/RA junction through a valved peel-away sheath. Position was confirmed with fluoroscopy. Images were obtained for documentation. The patient tolerated the procedure well. No immediate complications. Incisions were closed in a two layer fashion with 4 - 0 Vicryl suture. Dermabond was applied to the skin. The port catheter was accessed, blood was aspirated followed by saline and heparin flushes. Needle was removed. A dry sterile dressing was applied. IMPRESSION: Ultrasound and fluoroscopically guided right internal jugular single lumen power port catheter insertion. Tip in the SVC/RA junction. Catheter ready for use. Electronically Signed   By: Jerilynn Mages.  Shick M.D.   On: 2020/09/616 10:03   Dg Chest Port 1 View  Result  Date: 01/08/2017 CLINICAL DATA:  Shortness of breath. EXAM: PORTABLE CHEST 1 VIEW COMPARISON:  01/07/2017. FINDINGS: PowerPort catheter noted with lead tip over the superior vena cava. Cardiomegaly with bilateral from interstitial prominence. CHF cannot be excluded. Large left pleural effusion, increased in size from prior study 01/08/2015. IMPRESSION: 1. PowerPort catheter stable position. 2. Large left pleural effusion, significantly increase in size from prior exam. Underlying atelectasis and or infiltrate cannot be excluded. 3.  Cardiomegaly bilateral interstitial prominence. Electronically Signed   By: Marcello Moores  Register   On: 01/08/2017 14:06   Dg Abdomen Acute W/chest  Result Date: 01/07/2017 CLINICAL DATA:  Nausea and vomiting since last night. Cough. Receiving chemotherapy for ovarian cancer. EXAM: DG ABDOMEN ACUTE W/ 1V CHEST COMPARISON:  CT abdomen pelvis 12/12/2016. Chest radiograph 12/12/2016. FINDINGS: Large left pleural effusion with basilar atelectasis or infiltration similar to previous studies. Right lung is clear. Heart size and pulmonary vascularity are normal. No pneumothorax. Power port type central venous catheter with tip over the low SVC region. Calcification in the mitral valve annulus. Degenerative changes in the shoulders. Thoracolumbar scoliosis convex towards the right. Scattered gas and stool in the colon. No small or large bowel distention. No free intra-abdominal air. No abnormal air-fluid levels. Degenerative changes throughout the spine. Calcified phleboliths in the pelvis. No radiopaque stones. IMPRESSION: Large left pleural effusion with basilar atelectasis or infiltration similar previous studies. Nonobstructive bowel gas pattern. Electronically Signed   By: Lucienne Capers M.D.   On: 01/07/2017 03:25   Ir Fluoro Guide Port Insertion Right  Result Date: Feb 16, 202018 CLINICAL DATA:  Ovarian carcinoma, access for chemotherapy EXAM: RIGHT INTERNAL JUGULAR SINGLE LUMEN POWER  PORT CATHETER INSERTION Date:  Feb 16, 202018Feb 16, 202018 9:52 am Radiologist:  M. Daryll Brod, MD Guidance:  Ultrasound and fluoroscopic MEDICATIONS: Ancef 2 g; The antibiotic was administered within an appropriate time interval prior to skin puncture. ANESTHESIA/SEDATION: Versed 2.0 mg IV; Fentanyl 100 mcg IV; Moderate Sedation Time:  36 minutes The patient was continuously monitored during the procedure by the interventional radiology nurse under my direct supervision. FLUOROSCOPY TIME:  42 seconds (2 mGy) COMPLICATIONS: None immediate. CONTRAST:  None. PROCEDURE: Informed consent was obtained from the patient following explanation of the procedure, risks, benefits and alternatives. The patient understands, agrees and consents for the procedure. All questions were addressed. A time out was performed. Maximal barrier sterile technique utilized including caps, mask, sterile gowns, sterile gloves, large sterile drape, hand hygiene, and 2% chlorhexidine scrub. Under  sterile conditions and local anesthesia, right internal jugular micropuncture venous access was performed. Access was performed with ultrasound. Images were obtained for documentation. A guide wire was inserted followed by a transitional dilator. This allowed insertion of a guide wire and catheter into the IVC. Measurements were obtained from the SVC / RA junction back to the right IJ venotomy site. In the right infraclavicular chest, a subcutaneous pocket was created over the second anterior rib. This was done under sterile conditions and local anesthesia. 1% lidocaine with epinephrine was utilized for this. A 2.5 cm incision was made in the skin. Blunt dissection was performed to create a subcutaneous pocket over the right pectoralis major muscle. The pocket was flushed with saline vigorously. There was adequate hemostasis. The port catheter was assembled and checked for leakage. The port catheter was secured in the pocket with two retention sutures. The  tubing was tunneled subcutaneously to the right venotomy site and inserted into the SVC/RA junction through a valved peel-away sheath. Position was confirmed with fluoroscopy. Images were obtained for documentation. The patient tolerated the procedure well. No immediate complications. Incisions were closed in a two layer fashion with 4 - 0 Vicryl suture. Dermabond was applied to the skin. The port catheter was accessed, blood was aspirated followed by saline and heparin flushes. Needle was removed. A dry sterile dressing was applied. IMPRESSION: Ultrasound and fluoroscopically guided right internal jugular single lumen power port catheter insertion. Tip in the SVC/RA junction. Catheter ready for use. Electronically Signed   By: Jerilynn Mages.  Shick M.D.   On: 09-Aug-202018 10:03   US Thoracentesis Asp Pleural Space W/img Guide  Result Date: 01/08/2017 INDICATION: Short of breath.  Ovarian cancer with large left effusion EXAM: ULTRASOUND GUIDED left THORACENTESIS MEDICATIONS: None. COMPLICATIONS: None immediate. PROCEDURE: An ultrasound guided thoracentesis was thoroughly discussed with the patient and questions answered. The benefits, risks, alternatives and complications were also discussed. The patient understands and wishes to proceed with the procedure. Written consent was obtained. Ultrasound was performed to localize and mark an adequate pocket of fluid in the left chest. The area was then prepped and draped in the normal sterile fashion. 1% Lidocaine was used for local anesthesia. Under ultrasound guidance a 18 gauge catheter was introduced. Thoracentesis was performed. The catheter was removed and a dressing applied. FINDINGS: A total of approximately 1750 mL of bloody fluid was removed. IMPRESSION: Successful ultrasound guided left thoracentesis yielding 1750 mL of pleural fluid. Electronically Signed   By: Franchot Gallo M.D.   On: 01/08/2017 16:40    ASSESSMENT & PLAN:  Ovarian cancer on right [facility]) She  tolerated cycle 1 of treatment very poorly I recommend we proceed with cycle 2 with dose adjustment I also recommend IV fluid hydration therapy when she returns for Neulasta injection and she agreed  Malignant pleural effusion She had recent recurrent thoracentesis Clinical examination today satisfactory We will monitor closely  Weight loss She had recent profound weight loss due to side effects of treatment I will reduce dose of chemotherapy as above I explained to the patient and family members the rationale behind dose adjustment.  Chemotherapy-induced nausea She had recent severe chemotherapy induced nausea I recommend IV fluid supportive therapy after each cycle of treatment and she agreed   Orders Placed This Encounter  Procedures  . CA 125    Standing Status:   Standing    Number of Occurrences:   9    Standing Expiration Date:   01/23/2018   All questions  were answered. The patient knows to call the clinic with any problems, questions or concerns. No barriers to learning was detected. I spent 25 minutes counseling the patient face to face. The total time spent in the appointment was 40 minutes and more than 50% was on counseling and review of test results     Heath Lark, MD 01/23/2017 1:20 PM

## 2017-01-24 ENCOUNTER — Ambulatory Visit (HOSPITAL_BASED_OUTPATIENT_CLINIC_OR_DEPARTMENT_OTHER): Payer: Medicare Other

## 2017-01-24 ENCOUNTER — Ambulatory Visit: Payer: Medicare Other

## 2017-01-24 DIAGNOSIS — Z5189 Encounter for other specified aftercare: Secondary | ICD-10-CM

## 2017-01-24 DIAGNOSIS — C561 Malignant neoplasm of right ovary: Secondary | ICD-10-CM

## 2017-01-24 DIAGNOSIS — Z95828 Presence of other vascular implants and grafts: Secondary | ICD-10-CM

## 2017-01-24 MED ORDER — SODIUM CHLORIDE 0.9% FLUSH
10.0000 mL | INTRAVENOUS | Status: DC | PRN
  Administered 2017-01-24: 10 mL
  Filled 2017-01-24: qty 10

## 2017-01-24 MED ORDER — HEPARIN SOD (PORK) LOCK FLUSH 100 UNIT/ML IV SOLN
500.0000 [IU] | Freq: Once | INTRAVENOUS | Status: AC | PRN
  Administered 2017-01-24: 500 [IU]
  Filled 2017-01-24: qty 5

## 2017-01-24 MED ORDER — PEGFILGRASTIM INJECTION 6 MG/0.6ML ~~LOC~~
6.0000 mg | PREFILLED_SYRINGE | Freq: Once | SUBCUTANEOUS | Status: AC
  Administered 2017-01-24: 6 mg via SUBCUTANEOUS
  Filled 2017-01-24: qty 0.6

## 2017-01-24 MED ORDER — SODIUM CHLORIDE 0.9 % IV SOLN
Freq: Once | INTRAVENOUS | Status: AC
  Administered 2017-01-24: 15:00:00 via INTRAVENOUS

## 2017-01-24 NOTE — Patient Instructions (Signed)
Dehydration, Adult Dehydration is when there is not enough fluid or water in your body. This happens when you lose more fluids than you take in. Dehydration can range from mild to very bad. It should be treated right away to keep it from getting very bad. Symptoms of mild dehydration may include:  Thirst.  Dry lips.  Slightly dry mouth.  Dry, warm skin.  Dizziness. Symptoms of moderate dehydration may include:  Very dry mouth.  Muscle cramps.  Dark pee (urine). Pee may be the color of tea.  Your body making less pee.  Your eyes making fewer tears.  Heartbeat that is uneven or faster than normal (palpitations).  Headache.  Light-headedness, especially when you stand up from sitting.  Fainting (syncope). Symptoms of very bad dehydration may include:  Changes in skin, such as: ? Cold and clammy skin. ? Blotchy (mottled) or pale skin. ? Skin that does not quickly return to normal after being lightly pinched and let go (poor skin turgor).  Changes in body fluids, such as: ? Feeling very thirsty. ? Your eyes making fewer tears. ? Not sweating when body temperature is high, such as in hot weather. ? Your body making very little pee.  Changes in vital signs, such as: ? Weak pulse. ? Pulse that is more than 100 beats a minute when you are sitting still. ? Fast breathing. ? Low blood pressure.  Other changes, such as: ? Sunken eyes. ? Cold hands and feet. ? Confusion. ? Lack of energy (lethargy). ? Trouble waking up from sleep. ? Short-term weight loss. ? Unconsciousness. Follow these instructions at home:  If told by your doctor, drink an ORS: ? Make an ORS by using instructions on the package. ? Start by drinking small amounts, about  cup (120 mL) every 5-10 minutes. ? Slowly drink more until you have had the amount that your doctor said to have.  Drink enough clear fluid to keep your pee clear or pale yellow. If you were told to drink an ORS, finish the ORS  first, then start slowly drinking clear fluids. Drink fluids such as: ? Water. Do not drink only water by itself. Doing that can make the salt (sodium) level in your body get too low (hyponatremia). ? Ice chips. ? Fruit juice that you have added water to (diluted). ? Low-calorie sports drinks.  Avoid: ? Alcohol. ? Drinks that have a lot of sugar. These include high-calorie sports drinks, fruit juice that does not have water added, and soda. ? Caffeine. ? Foods that are greasy or have a lot of fat or sugar.  Take over-the-counter and prescription medicines only as told by your doctor.  Do not take salt tablets. Doing that can make the salt level in your body get too high (hypernatremia).  Eat foods that have minerals (electrolytes). Examples include bananas, oranges, potatoes, tomatoes, and spinach.  Keep all follow-up visits as told by your doctor. This is important. Contact a doctor if:  You have belly (abdominal) pain that: ? Gets worse. ? Stays in one area (localizes).  You have a rash.  You have a stiff neck.  You get angry or annoyed more easily than normal (irritability).  You are more sleepy than normal.  You have a harder time waking up than normal.  You feel: ? Weak. ? Dizzy. ? Very thirsty.  You have peed (urinated) only a small amount of very dark pee during 6-8 hours. Get help right away if:  You have symptoms of   very bad dehydration.  You cannot drink fluids without throwing up (vomiting).  Your symptoms get worse with treatment.  You have a fever.  You have a very bad headache.  You are throwing up or having watery poop (diarrhea) and it: ? Gets worse. ? Does not go away.  You have blood or something green (bile) in your throw-up.  You have blood in your poop (stool). This may cause poop to look black and tarry.  You have not peed in 6-8 hours.  You pass out (faint).  Your heart rate when you are sitting still is more than 100 beats a  minute.  You have trouble breathing. This information is not intended to replace advice given to you by your health care provider. Make sure you discuss any questions you have with your health care provider. Document Released: 03/23/2009 Document Revised: 12/15/2015 Document Reviewed: 07/21/2015 Elsevier Interactive Patient Education  2018 [employer] Inc.  

## 2017-01-28 ENCOUNTER — Encounter: Payer: Self-pay | Admitting: Hematology and Oncology

## 2017-01-28 NOTE — Progress Notes (Signed)
Spoke w/ pt's daughter about the [facility], [place] grants and the [employer].  Briefly went over what they cover and the income requirement.  Pt would like to apply so she will bring her proof of income on 02/13/17.  I will email her a copy of the [employer] to complete and bring it along with the needed docs required for processing to me on 02/13/17.  Once received I will email it to [place] to process it.  Because of pt's ins and Dx there aren't any foundations offering copay assistance at this time.

## 2017-02-13 ENCOUNTER — Encounter: Payer: Self-pay | Admitting: Hematology and Oncology

## 2017-02-13 ENCOUNTER — Ambulatory Visit: Payer: Medicare Other

## 2017-02-13 ENCOUNTER — Other Ambulatory Visit (HOSPITAL_BASED_OUTPATIENT_CLINIC_OR_DEPARTMENT_OTHER): Payer: Medicare Other

## 2017-02-13 ENCOUNTER — Ambulatory Visit (HOSPITAL_BASED_OUTPATIENT_CLINIC_OR_DEPARTMENT_OTHER): Payer: Medicare Other | Admitting: Hematology and Oncology

## 2017-02-13 ENCOUNTER — Ambulatory Visit (HOSPITAL_BASED_OUTPATIENT_CLINIC_OR_DEPARTMENT_OTHER): Payer: Medicare Other

## 2017-02-13 VITALS — BP 141/63 | HR 78 | Temp 97.8°F | Resp 18 | Ht 60.0 in | Wt 142.9 lb

## 2017-02-13 DIAGNOSIS — Z95828 Presence of other vascular implants and grafts: Secondary | ICD-10-CM

## 2017-02-13 DIAGNOSIS — C561 Malignant neoplasm of right ovary: Secondary | ICD-10-CM

## 2017-02-13 DIAGNOSIS — K5909 Other constipation: Secondary | ICD-10-CM | POA: Diagnosis not present

## 2017-02-13 DIAGNOSIS — Z5111 Encounter for antineoplastic chemotherapy: Secondary | ICD-10-CM

## 2017-02-13 DIAGNOSIS — J910 Malignant pleural effusion: Secondary | ICD-10-CM | POA: Diagnosis not present

## 2017-02-13 DIAGNOSIS — R634 Abnormal weight loss: Secondary | ICD-10-CM | POA: Diagnosis not present

## 2017-02-13 LAB — COMPREHENSIVE METABOLIC PANEL
ALT: 21 U/L (ref 0–55)
AST: 22 U/L (ref 5–34)
Albumin: 3.5 g/dL (ref 3.5–5.0)
Alkaline Phosphatase: 100 U/L (ref 40–150)
Anion Gap: 10 mEq/L (ref 3–11)
BUN: 11.4 mg/dL (ref 7.0–26.0)
CALCIUM: 10 mg/dL (ref 8.4–10.4)
CHLORIDE: 101 meq/L (ref 98–109)
CO2: 25 mEq/L (ref 22–29)
Creatinine: 0.8 mg/dL (ref 0.6–1.1)
EGFR: 63 mL/min/{1.73_m2} — ABNORMAL LOW (ref >90)
Glucose: 157 mg/dl — ABNORMAL HIGH (ref 70–140)
POTASSIUM: 3.7 meq/L (ref 3.5–5.1)
SODIUM: 136 meq/L (ref 136–145)
Total Bilirubin: 0.56 mg/dL (ref 0.20–1.20)
Total Protein: 6.8 g/dL (ref 6.4–8.3)

## 2017-02-13 LAB — CBC WITH DIFFERENTIAL/PLATELET
BASO%: 0.2 % (ref 0.0–2.0)
BASOS ABS: 0 10*3/uL (ref 0.0–0.1)
EOS ABS: 0 10*3/uL (ref 0.0–0.5)
EOS%: 0 % (ref 0.0–7.0)
HEMATOCRIT: 30.3 % — AB (ref 34.8–46.6)
HGB: 10.5 g/dL — ABNORMAL LOW (ref 11.6–15.9)
LYMPH#: 1.9 10*3/uL (ref 0.9–3.3)
LYMPH%: 24.5 % (ref 14.0–49.7)
MCH: 33.4 pg (ref 25.1–34.0)
MCHC: 34.6 g/dL (ref 31.5–36.0)
MCV: 96.5 fL (ref 79.5–101.0)
MONO#: 0.1 10*3/uL (ref 0.1–0.9)
MONO%: 1 % (ref 0.0–14.0)
NEUT#: 5.6 10*3/uL (ref 1.5–6.5)
NEUT%: 74.3 % (ref 38.4–76.8)
PLATELETS: 197 10*3/uL (ref 145–400)
RBC: 3.14 10*6/uL — ABNORMAL LOW (ref 3.70–5.45)
RDW: 19.9 % — ABNORMAL HIGH (ref 11.2–14.5)
WBC: 7.6 10*3/uL (ref 3.9–10.3)

## 2017-02-13 MED ORDER — FAMOTIDINE IN NACL 20-0.9 MG/50ML-% IV SOLN
INTRAVENOUS | Status: AC
  Filled 2017-02-13: qty 50

## 2017-02-13 MED ORDER — SODIUM CHLORIDE 0.9 % IV SOLN
Freq: Once | INTRAVENOUS | Status: AC
  Administered 2017-02-13: 10:00:00 via INTRAVENOUS
  Filled 2017-02-13: qty 5

## 2017-02-13 MED ORDER — DIPHENHYDRAMINE HCL 50 MG/ML IJ SOLN
50.0000 mg | Freq: Once | INTRAMUSCULAR | Status: AC
  Administered 2017-02-13: 50 mg via INTRAVENOUS

## 2017-02-13 MED ORDER — SODIUM CHLORIDE 0.9% FLUSH
10.0000 mL | INTRAVENOUS | Status: DC | PRN
  Administered 2017-02-13: 10 mL
  Filled 2017-02-13: qty 10

## 2017-02-13 MED ORDER — SODIUM CHLORIDE 0.9 % IV SOLN
131.2500 mg/m2 | Freq: Once | INTRAVENOUS | Status: AC
  Administered 2017-02-13: 228 mg via INTRAVENOUS
  Filled 2017-02-13: qty 38

## 2017-02-13 MED ORDER — HEPARIN SOD (PORK) LOCK FLUSH 100 UNIT/ML IV SOLN
500.0000 [IU] | Freq: Once | INTRAVENOUS | Status: DC | PRN
  Filled 2017-02-13: qty 5

## 2017-02-13 MED ORDER — FAMOTIDINE IN NACL 20-0.9 MG/50ML-% IV SOLN
20.0000 mg | Freq: Once | INTRAVENOUS | Status: AC
  Administered 2017-02-13: 20 mg via INTRAVENOUS

## 2017-02-13 MED ORDER — SODIUM CHLORIDE 0.9 % IV SOLN
320.0000 mg | Freq: Once | INTRAVENOUS | Status: AC
  Administered 2017-02-13: 320 mg via INTRAVENOUS
  Filled 2017-02-13: qty 32

## 2017-02-13 MED ORDER — PALONOSETRON HCL INJECTION 0.25 MG/5ML
INTRAVENOUS | Status: AC
  Filled 2017-02-13: qty 5

## 2017-02-13 MED ORDER — DIPHENHYDRAMINE HCL 50 MG/ML IJ SOLN
INTRAMUSCULAR | Status: AC
  Filled 2017-02-13: qty 1

## 2017-02-13 MED ORDER — HEPARIN SOD (PORK) LOCK FLUSH 100 UNIT/ML IV SOLN
500.0000 [IU] | Freq: Once | INTRAVENOUS | Status: AC | PRN
  Administered 2017-02-13: 500 [IU]
  Filled 2017-02-13: qty 5

## 2017-02-13 MED ORDER — PALONOSETRON HCL INJECTION 0.25 MG/5ML
0.2500 mg | Freq: Once | INTRAVENOUS | Status: AC
  Administered 2017-02-13: 0.25 mg via INTRAVENOUS

## 2017-02-13 MED ORDER — SODIUM CHLORIDE 0.9 % IV SOLN
Freq: Once | INTRAVENOUS | Status: AC
  Administered 2017-02-13: 10:00:00 via INTRAVENOUS

## 2017-02-13 MED ORDER — PEGFILGRASTIM 6 MG/0.6ML ~~LOC~~ PSKT
6.0000 mg | PREFILLED_SYRINGE | Freq: Once | SUBCUTANEOUS | Status: AC
  Administered 2017-02-13: 6 mg via SUBCUTANEOUS
  Filled 2017-02-13: qty 0.6

## 2017-02-13 NOTE — Progress Notes (Signed)
Pt is approved for the $400 [employer] and $400 [employer] grants.  °

## 2017-02-13 NOTE — Patient Instructions (Signed)
Implanted Port Home Guide An implanted port is a type of central line that is placed under the skin. Central lines are used to provide IV access when treatment or nutrition needs to be given through a person's veins. Implanted ports are used for long-term IV access. An implanted port may be placed because:  You need IV medicine that would be irritating to the small veins in your hands or arms.  You need long-term IV medicines, such as antibiotics.  You need IV nutrition for a long period.  You need frequent blood draws for lab tests.  You need dialysis.  Implanted ports are usually placed in the chest area, but they can also be placed in the upper arm, the abdomen, or the leg. An implanted port has two main parts:  Reservoir. The reservoir is round and will appear as a small, raised area under your skin. The reservoir is the part where a needle is inserted to give medicines or draw blood.  Catheter. The catheter is a thin, flexible tube that extends from the reservoir. The catheter is placed into a large vein. Medicine that is inserted into the reservoir goes into the catheter and then into the vein.  How will I care for my incision site? Do not get the incision site wet. Bathe or shower as directed by your health care provider. How is my port accessed? Special steps must be taken to access the port:  Before the port is accessed, a numbing cream can be placed on the skin. This helps numb the skin over the port site.  Your health care provider uses a sterile technique to access the port. ? Your health care provider must put on a mask and sterile gloves. ? The skin over your port is cleaned carefully with an antiseptic and allowed to dry. ? The port is gently pinched between sterile gloves, and a needle is inserted into the port.  Only "non-coring" port needles should be used to access the port. Once the port is accessed, a blood return should be checked. This helps ensure that the port  is in the vein and is not clogged.  If your port needs to remain accessed for a constant infusion, a clear (transparent) bandage will be placed over the needle site. The bandage and needle will need to be changed every week, or as directed by your health care provider.  Keep the bandage covering the needle clean and dry. Do not get it wet. Follow your health care provider's instructions on how to take a shower or bath while the port is accessed.  If your port does not need to stay accessed, no bandage is needed over the port.  What is flushing? Flushing helps keep the port from getting clogged. Follow your health care provider's instructions on how and when to flush the port. Ports are usually flushed with saline solution or a medicine called heparin. The need for flushing will depend on how the port is used.  If the port is used for intermittent medicines or blood draws, the port will need to be flushed: ? After medicines have been given. ? After blood has been drawn. ? As part of routine maintenance.  If a constant infusion is running, the port may not need to be flushed.  How long will my port stay implanted? The port can stay in for as long as your health care provider thinks it is needed. When it is time for the port to come out, surgery will be   done to remove it. The procedure is similar to the one performed when the port was put in. When should I seek immediate medical care? When you have an implanted port, you should seek immediate medical care if:  You notice a bad smell coming from the incision site.  You have swelling, redness, or drainage at the incision site.  You have more swelling or pain at the port site or the surrounding area.  You have a fever that is not controlled with medicine.  This information is not intended to replace advice given to you by your health care provider. Make sure you discuss any questions you have with your health care provider. Document  Released: 05/27/2005 Document Revised: 11/02/2015 Document Reviewed: 02/01/2013 Elsevier Interactive Patient Education  2017 [employer]  

## 2017-02-13 NOTE — Assessment & Plan Note (Signed)
I recommend she continues to eat as much as possible I will reduce dose of chemotherapy as above

## 2017-02-13 NOTE — Progress Notes (Signed)
[place] OFFICE PROGRESS NOTE  Patient Care Team: Monico Blitz, MD as PCP - General (Internal Medicine)  SUMMARY OF ONCOLOGIC HISTORY:   Ovarian cancer on right [facility])   12/07/2016 Imaging    Ct angiogram:  No evidence of pulmonary embolus.  Large left and small right pleural effusions. Complete collapse of the left lower lobe, partial collapse of the lingula and milder atelectasis of the left upper lobe, likely due to compressive atelectasis. The collapsed lung parenchyma is poorly evaluated, but no obvious pulmonary masses are seen.  Interstitial pulmonary edema.  Small volume abdominal ascites.  Heterogeneous appearance of the thyroid gland, with right sided nodules.  Aortic Atherosclerosis (ICD10-I70.0).      12/07/2016 - 12/13/2016 Hospital Admission    She was admitted for generalized malaise, abnormal blood work, large bilateral pleural effusion and possible right ovarian cancer      12/08/2016 Imaging    LV EF: 65% -   70%      12/08/2016 Pathology Results    PLEURAL FLUID, LEFT (SPECIMEN 1 OF 1, COLLECTED ON 12/08/16): - MALIGNANT CELLS CONSISTENT WITH METASTATIC ADENOCARCINOMA, SEE COMMENT      12/08/2016 Procedure    Successful ultrasound guided left thoracentesis yielding 1.6 L of pleural fluid. The procedure was terminated early secondary to significant coughing.      12/12/2016 Imaging    CT scan of abdomen and pelvis 1. Septated right adnexal cyst measuring 8.5 x 6.5 cm, concerning for ovarian malignancy in this setting. Small volume complex intra-abdominal ascites which tracks in the pericolic gutters and anterior omentum. No definite soft tissue omental caking. 2. Endometrial thickening versus fluid in the endometrial canal, nonspecific. Rounded 3.9 cm lesion in the uterine fundus may be a fibroid, however would be unusual for patient's age. Endometrial/ uterine neoplasm is considered. 3. Nonspecific ill-defined low-density lesion in the right lobe  of the liver. Probable hepatic hemangioma at the dome. 4. Further evaluation of the GYN structures could be performed with ultrasound. MRI may provide further detail if needed for potential surgical planning. 5. Aortic atherosclerosis      12/12/2016 Imaging    CXR 1. Progressive left base atelectasis and infiltrate. Small left pleural effusion again noted. 2.  Cardiomegaly, no evidence of pulmonary venous congestion.      12/13/2016 Tumor Marker    Patient's tumor was tested for the following markers: CA-125. Results of the tumor marker test revealed 1310.      Aug 28, 202018 Procedure    Ultrasound and fluoroscopically guided right internal jugular single lumen power port catheter insertion. Tip in the SVC/RA junction. Catheter ready for use.      01/01/2017 -  Chemotherapy    She received carboplatin and Taxol      01/07/2017 - 01/11/2017 Hospital Admission    She was admitted for severe nausea/vomiting and pleural effusion.      01/08/2017 Procedure    Successful ultrasound guided left thoracentesis yielding 1750 mL of pleural fluid      01/08/2017 Imaging    Marked improvement in left pleural effusion following thoracentesis. Negative for pneumothorax      01/22/2017 Adverse Reaction    Second dose of chemo requires significant dose adjustment and GCSF support       INTERVAL HISTORY: Please see below for problem oriented charting. She returns prior to cycle 3 of therapy She did fine with cycle 2 She lost only 2 pounds since her last time I saw her but she is eating well  Her daughter noted less orthopnea She denies peripheral neuropathy No recent nausea vomiting.  REVIEW OF SYSTEMS:   Constitutional: Denies fevers, chills Eyes: Denies blurriness of vision Ears, nose, mouth, throat, and face: Denies mucositis or sore throat Respiratory: Denies cough, dyspnea or wheezes Cardiovascular: Denies palpitation, chest discomfort or lower extremity swelling Gastrointestinal:  Denies  nausea, heartburn or change in bowel habits Skin: Denies abnormal skin rashes Lymphatics: Denies new lymphadenopathy or easy bruising Neurological:Denies numbness, tingling or new weaknesses Behavioral/Psych: Mood is stable, no new changes  All other systems were reviewed with the patient and are negative.  I have reviewed the past medical history, past surgical history, social history and family history with the patient and they are unchanged from previous note.  ALLERGIES:  is allergic to tetanus toxoids and codeine.  MEDICATIONS:  Current Outpatient Prescriptions  Medication Sig Dispense Refill  . aspirin EC 81 MG tablet Take 81 mg by mouth daily.    . bisoprolol (ZEBETA) 10 MG tablet Take 10 mg by mouth daily.    . Cholecalciferol (VITAMIN D) 2000 units tablet Take 2,000 Units by mouth daily.    Marland Kitchen dexamethasone (DECADRON) 4 MG tablet 5 tabs the night before and 5 tabs in the morning of chemo, take with food, every 3 weeks 30 tablet 1  . furosemide (LASIX) 20 MG tablet Take 1 tablet (20 mg total) by mouth daily. 30 tablet 0  . lidocaine-prilocaine (EMLA) cream Apply to affected area once 30 g 3  . Multiple Vitamins-Minerals (MULTI FOR HER 50+) TABS Take 1 tablet by mouth daily.    Marland Kitchen nystatin (MYCOSTATIN) 100000 UNIT/ML suspension Take 5 mLs (500,000 Units total) by mouth 4 (four) times daily. Stop 48 hours after disappearance of white spots. 473 mL 0  . Omega-3 Fatty Acids (FISH OIL) 500 MG CAPS Take 1 capsule by mouth daily.    . ondansetron (ZOFRAN) 8 MG tablet Take three times a day as needed 30 tablet 1  . prochlorperazine (COMPAZINE) 10 MG tablet Take 1 tablet (10 mg total) by mouth every 6 (six) hours as needed (Nausea or vomiting). 30 tablet 1  . simvastatin (ZOCOR) 20 MG tablet Take 20 mg by mouth at bedtime.      No current facility-administered medications for this visit.    Facility-Administered Medications Ordered in Other Visits  Medication Dose Route Frequency Provider  Last Rate Last Dose  . CARBOplatin (PARAPLATIN) 320 mg in sodium chloride 0.9 % 100 mL chemo infusion  320 mg Intravenous Once Alvy Bimler, Melissa Pulido, MD      . famotidine (PEPCID) IVPB 20 mg premix  20 mg Intravenous Once Alvy Bimler, Rochell Mabie, MD      . fosaprepitant (EMEND) 150 mg, dexamethasone (DECADRON) 12 mg in sodium chloride 0.9 % 145 mL IVPB   Intravenous Once Heath Lark, MD 454 mL/hr at 02/13/17 1016    . heparin lock flush 100 unit/mL  500 Units Intracatheter Once PRN Alvy Bimler, Madison Albea, MD      . PACLitaxel (TAXOL) 228 mg in sodium chloride 0.9 % 250 mL chemo infusion (> 71m/m2)  131.25 mg/m2 (Treatment Plan Recorded) Intravenous Once Aeron Lheureux, MD      . pegfilgrastim (NEULASTA ONPRO KIT) injection 6 mg  6 mg Subcutaneous Once Consuello Lassalle, MD      . sodium chloride flush (NS) 0.9 % injection 10 mL  10 mL Intracatheter PRN GAlvy Bimler Seven Dollens, MD        PHYSICAL EXAMINATION: ECOG PERFORMANCE STATUS: 1 - Symptomatic but completely ambulatory  Vitals:   02/13/17 0932  BP: (!) 141/63  Pulse: 78  Resp: 18  Temp: 97.8 F (36.6 C)  SpO2: 97%   Filed Weights   02/13/17 0932  Weight: 142 lb 14.4 oz (64.8 kg)    GENERAL:alert, no distress and comfortable SKIN: skin color, texture, turgor are normal, no rashes or significant lesions EYES: normal, Conjunctiva are pink and non-injected, sclera clear OROPHARYNX:no exudate, no erythema and lips, buccal mucosa, and tongue normal  NECK: supple, thyroid normal size, non-tender, without nodularity LYMPH:  no palpable lymphadenopathy in the cervical, axillary or inguinal LUNGS: clear to auscultation and percussion with normal breathing effort HEART: regular rate & rhythm and no murmurs and no lower extremity edema ABDOMEN:abdomen soft, non-tender and normal bowel sounds Musculoskeletal:no cyanosis of digits and no clubbing  NEURO: alert & oriented x 3 with fluent speech, no focal motor/sensory deficits  LABORATORY DATA:  I have reviewed the data as listed     Component Value Date/Time   NA 136 02/13/2017 0900   K 3.7 02/13/2017 0900   CL 99 (L) 01/11/2017 0610   CO2 25 02/13/2017 0900   GLUCOSE 157 (H) 02/13/2017 0900   BUN 11.4 02/13/2017 0900   CREATININE 0.8 02/13/2017 0900   CALCIUM 10.0 02/13/2017 0900   PROT 6.8 02/13/2017 0900   ALBUMIN 3.5 02/13/2017 0900   AST 22 02/13/2017 0900   ALT 21 02/13/2017 0900   ALKPHOS 100 02/13/2017 0900   BILITOT 0.56 02/13/2017 0900   GFRNONAA >60 01/11/2017 0610   GFRAA >60 01/11/2017 0610    No results found for: SPEP, UPEP  Lab Results  Component Value Date   WBC 7.6 02/13/2017   NEUTROABS 5.6 02/13/2017   HGB 10.5 (L) 02/13/2017   HCT 30.3 (L) 02/13/2017   MCV 96.5 02/13/2017   PLT 197 02/13/2017      Chemistry      Component Value Date/Time   NA 136 02/13/2017 0900   K 3.7 02/13/2017 0900   CL 99 (L) 01/11/2017 0610   CO2 25 02/13/2017 0900   BUN 11.4 02/13/2017 0900   CREATININE 0.8 02/13/2017 0900      Component Value Date/Time   CALCIUM 10.0 02/13/2017 0900   ALKPHOS 100 02/13/2017 0900   AST 22 02/13/2017 0900   ALT 21 02/13/2017 0900   BILITOT 0.56 02/13/2017 0900      ASSESSMENT & PLAN:  Ovarian cancer on right [facility]) She tolerated cycle 1 of treatment very poorly With cycle 2 of treatment, we adjusted the dose and provided additional IV fluid support and she did fine We will proceed with cycle 3 with a similar dose adjustment I plan to repeat CT scan prior to cycle 4 of therapy We discussed the risk and benefit of regular Neulasta injection versus Onpro and the patient would like to get Onpro instead of coming back for Neulasta injection  Malignant pleural effusion She had recent recurrent thoracentesis Clinical examination today satisfactory without signs of reucrrence We will monitor closely  Chronic constipation We discussed the importance of aggressive laxative therapy.  Weight loss I recommend she continues to eat as much as possible I will reduce  dose of chemotherapy as above   Orders Placed This Encounter  Procedures  . CT ABDOMEN PELVIS W CONTRAST    Standing Status:   Future    Standing Expiration Date:   02/13/2018    Order Specific Question:   If indicated for the ordered procedure, I authorize the administration of  contrast media per Radiology protocol    Answer:   Yes    Order Specific Question:   Preferred imaging location?    Answer:   [facility]    Order Specific Question:   Radiology Contrast Protocol - do NOT remove file path    Answer:   [email or web address]   All questions were answered. The patient knows to call the clinic with any problems, questions or concerns. No barriers to learning was detected. I spent 20 minutes counseling the patient face to face. The total time spent in the appointment was 30 minutes and more than 50% was on counseling and review of test results     Heath Lark, MD 02/13/2017 10:19 AM

## 2017-02-13 NOTE — Assessment & Plan Note (Signed)
She had recent recurrent thoracentesis Clinical examination today satisfactory without signs of reucrrence We will monitor closely

## 2017-02-13 NOTE — Assessment & Plan Note (Signed)
She tolerated cycle 1 of treatment very poorly With cycle 2 of treatment, we adjusted the dose and provided additional IV fluid support and she did fine We will proceed with cycle 3 with a similar dose adjustment I plan to repeat CT scan prior to cycle 4 of therapy We discussed the risk and benefit of regular Neulasta injection versus Onpro and the patient would like to get Onpro instead of coming back for Neulasta injection

## 2017-02-13 NOTE — Assessment & Plan Note (Signed)
We discussed the importance of aggressive laxative therapy 

## 2017-02-14 LAB — CA 125: Cancer Antigen (CA) 125: 310.6 U/mL — ABNORMAL HIGH (ref 0.0–38.1)

## 2017-02-15 ENCOUNTER — Ambulatory Visit: Payer: Medicare Other

## 2017-03-05 ENCOUNTER — Encounter (HOSPITAL_COMMUNITY): Payer: Self-pay

## 2017-03-05 ENCOUNTER — Ambulatory Visit (HOSPITAL_COMMUNITY)
Admission: RE | Admit: 2017-03-05 | Discharge: 2017-03-05 | Disposition: A | Discharge status: Home / Self Care | Payer: Medicare Other | Source: Ambulatory Visit | Attending: Hematology and Oncology | Admitting: Hematology and Oncology

## 2017-03-05 DIAGNOSIS — C561 Malignant neoplasm of right ovary: Secondary | ICD-10-CM | POA: Diagnosis not present

## 2017-03-05 DIAGNOSIS — J90 Pleural effusion, not elsewhere classified: Secondary | ICD-10-CM | POA: Diagnosis not present

## 2017-03-05 DIAGNOSIS — R935 Abnormal findings on diagnostic imaging of other abdominal regions, including retroperitoneum: Secondary | ICD-10-CM | POA: Diagnosis not present

## 2017-03-05 MED ORDER — IOPAMIDOL (ISOVUE-300) INJECTION 61%
INTRAVENOUS | Status: AC
  Filled 2017-03-05: qty 100

## 2017-03-05 MED ORDER — IOPAMIDOL (ISOVUE-300) INJECTION 61%
100.0000 mL | Freq: Once | INTRAVENOUS | Status: AC | PRN
  Administered 2017-03-05: 100 mL via INTRAVENOUS

## 2017-03-06 ENCOUNTER — Ambulatory Visit (HOSPITAL_BASED_OUTPATIENT_CLINIC_OR_DEPARTMENT_OTHER): Payer: Medicare Other

## 2017-03-06 ENCOUNTER — Telehealth: Payer: Self-pay | Admitting: Hematology and Oncology

## 2017-03-06 ENCOUNTER — Other Ambulatory Visit (HOSPITAL_BASED_OUTPATIENT_CLINIC_OR_DEPARTMENT_OTHER): Payer: Medicare Other

## 2017-03-06 ENCOUNTER — Ambulatory Visit (HOSPITAL_BASED_OUTPATIENT_CLINIC_OR_DEPARTMENT_OTHER): Payer: Medicare Other | Admitting: Hematology and Oncology

## 2017-03-06 DIAGNOSIS — J910 Malignant pleural effusion: Secondary | ICD-10-CM

## 2017-03-06 DIAGNOSIS — Z7189 Other specified counseling: Secondary | ICD-10-CM | POA: Diagnosis not present

## 2017-03-06 DIAGNOSIS — Z23 Encounter for immunization: Secondary | ICD-10-CM

## 2017-03-06 DIAGNOSIS — C561 Malignant neoplasm of right ovary: Secondary | ICD-10-CM

## 2017-03-06 DIAGNOSIS — Z5111 Encounter for antineoplastic chemotherapy: Secondary | ICD-10-CM

## 2017-03-06 LAB — COMPREHENSIVE METABOLIC PANEL
ALBUMIN: 3.7 g/dL (ref 3.5–5.0)
ALK PHOS: 100 U/L (ref 40–150)
ALT: 18 U/L (ref 0–55)
AST: 19 U/L (ref 5–34)
Anion Gap: 11 mEq/L (ref 3–11)
BILIRUBIN TOTAL: 0.55 mg/dL (ref 0.20–1.20)
BUN: 12 mg/dL (ref 7.0–26.0)
CO2: 25 mEq/L (ref 22–29)
CREATININE: 0.9 mg/dL (ref 0.6–1.1)
Calcium: 10.3 mg/dL (ref 8.4–10.4)
Chloride: 102 mEq/L (ref 98–109)
EGFR: 59 mL/min/{1.73_m2} — ABNORMAL LOW (ref >90)
GLUCOSE: 153 mg/dL — AB (ref 70–140)
Potassium: 3.9 mEq/L (ref 3.5–5.1)
SODIUM: 138 meq/L (ref 136–145)
TOTAL PROTEIN: 7.2 g/dL (ref 6.4–8.3)

## 2017-03-06 LAB — CBC WITH DIFFERENTIAL/PLATELET
BASO%: 0.1 % (ref 0.0–2.0)
Basophils Absolute: 0 10*3/uL (ref 0.0–0.1)
EOS%: 0 % (ref 0.0–7.0)
Eosinophils Absolute: 0 10*3/uL (ref 0.0–0.5)
HEMATOCRIT: 32.2 % — AB (ref 34.8–46.6)
HEMOGLOBIN: 10.6 g/dL — AB (ref 11.6–15.9)
LYMPH#: 1.8 10*3/uL (ref 0.9–3.3)
LYMPH%: 21.5 % (ref 14.0–49.7)
MCH: 33.2 pg (ref 25.1–34.0)
MCHC: 32.9 g/dL (ref 31.5–36.0)
MCV: 100.9 fL (ref 79.5–101.0)
MONO#: 0.2 10*3/uL (ref 0.1–0.9)
MONO%: 1.8 % (ref 0.0–14.0)
NEUT%: 76.6 % (ref 38.4–76.8)
NEUTROS ABS: 6.2 10*3/uL (ref 1.5–6.5)
Platelets: 211 10*3/uL (ref 145–400)
RBC: 3.19 10*6/uL — ABNORMAL LOW (ref 3.70–5.45)
RDW: 18.4 % — AB (ref 11.2–14.5)
WBC: 8.1 10*3/uL (ref 3.9–10.3)

## 2017-03-06 MED ORDER — PALONOSETRON HCL INJECTION 0.25 MG/5ML
0.2500 mg | Freq: Once | INTRAVENOUS | Status: AC
  Administered 2017-03-06: 0.25 mg via INTRAVENOUS

## 2017-03-06 MED ORDER — SODIUM CHLORIDE 0.9 % IV SOLN
131.2500 mg/m2 | Freq: Once | INTRAVENOUS | Status: AC
  Administered 2017-03-06: 228 mg via INTRAVENOUS
  Filled 2017-03-06: qty 38

## 2017-03-06 MED ORDER — PALONOSETRON HCL INJECTION 0.25 MG/5ML
INTRAVENOUS | Status: AC
  Filled 2017-03-06: qty 5

## 2017-03-06 MED ORDER — PEGFILGRASTIM 6 MG/0.6ML ~~LOC~~ PSKT
6.0000 mg | PREFILLED_SYRINGE | Freq: Once | SUBCUTANEOUS | Status: AC
  Administered 2017-03-06: 6 mg via SUBCUTANEOUS
  Filled 2017-03-06: qty 0.6

## 2017-03-06 MED ORDER — SODIUM CHLORIDE 0.9 % IV SOLN
317.7000 mg | Freq: Once | INTRAVENOUS | Status: AC
  Administered 2017-03-06: 320 mg via INTRAVENOUS
  Filled 2017-03-06: qty 32

## 2017-03-06 MED ORDER — SODIUM CHLORIDE 0.9 % IV SOLN
Freq: Once | INTRAVENOUS | Status: AC
  Administered 2017-03-06: 11:00:00 via INTRAVENOUS
  Filled 2017-03-06: qty 5

## 2017-03-06 MED ORDER — FAMOTIDINE IN NACL 20-0.9 MG/50ML-% IV SOLN
20.0000 mg | Freq: Once | INTRAVENOUS | Status: AC
  Administered 2017-03-06: 20 mg via INTRAVENOUS

## 2017-03-06 MED ORDER — SODIUM CHLORIDE 0.9% FLUSH
10.0000 mL | INTRAVENOUS | Status: DC | PRN
  Administered 2017-03-06: 10 mL
  Filled 2017-03-06: qty 10

## 2017-03-06 MED ORDER — HEPARIN SOD (PORK) LOCK FLUSH 100 UNIT/ML IV SOLN
500.0000 [IU] | Freq: Once | INTRAVENOUS | Status: AC | PRN
  Administered 2017-03-06: 500 [IU]
  Filled 2017-03-06: qty 5

## 2017-03-06 MED ORDER — SODIUM CHLORIDE 0.9 % IV SOLN
Freq: Once | INTRAVENOUS | Status: AC
  Administered 2017-03-06: 10:00:00 via INTRAVENOUS

## 2017-03-06 MED ORDER — FAMOTIDINE IN NACL 20-0.9 MG/50ML-% IV SOLN
INTRAVENOUS | Status: AC
  Filled 2017-03-06: qty 50

## 2017-03-06 MED ORDER — DIPHENHYDRAMINE HCL 50 MG/ML IJ SOLN
50.0000 mg | Freq: Once | INTRAMUSCULAR | Status: AC
  Administered 2017-03-06: 50 mg via INTRAVENOUS

## 2017-03-06 MED ORDER — INFLUENZA VAC SPLIT QUAD 0.5 ML IM SUSY
0.5000 mL | PREFILLED_SYRINGE | Freq: Once | INTRAMUSCULAR | Status: AC
  Administered 2017-03-06: 0.5 mL via INTRAMUSCULAR
  Filled 2017-03-06: qty 0.5

## 2017-03-06 MED ORDER — DIPHENHYDRAMINE HCL 50 MG/ML IJ SOLN
INTRAMUSCULAR | Status: AC
  Filled 2017-03-06: qty 1

## 2017-03-06 NOTE — Telephone Encounter (Signed)
Scheduled appt per 9/27 los - Gave patient AVS and calender per los.  

## 2017-03-06 NOTE — Patient Instructions (Signed)
   [facility] Discharge Instructions for Patients Receiving Chemotherapy  Today you received the following chemotherapy agents Taxol and Carboplatin   To help prevent nausea and vomiting after your treatment, we encourage you to take your nausea medication as directed.    If you develop nausea and vomiting that is not controlled by your nausea medication, call the clinic.   BELOW ARE SYMPTOMS THAT SHOULD BE REPORTED IMMEDIATELY:  *FEVER GREATER THAN 100.5 F  *CHILLS WITH OR WITHOUT FEVER  NAUSEA AND VOMITING THAT IS NOT CONTROLLED WITH YOUR NAUSEA MEDICATION  *UNUSUAL SHORTNESS OF BREATH  *UNUSUAL BRUISING OR BLEEDING  TENDERNESS IN MOUTH AND THROAT WITH OR WITHOUT PRESENCE OF ULCERS  *URINARY PROBLEMS  *BOWEL PROBLEMS  UNUSUAL RASH Items with * indicate a potential emergency and should be followed up as soon as possible.  Feel free to call the clinic should you have any questions or concerns. The clinic phone number is [phone].  Please show the CHEMO ALERT CARD at check-in to the Emergency Department and triage nurse.   

## 2017-03-07 ENCOUNTER — Encounter: Payer: Self-pay | Admitting: Hematology and Oncology

## 2017-03-07 LAB — CA 125: Cancer Antigen (CA) 125: 128.1 U/mL — ABNORMAL HIGH (ref 0.0–38.1)

## 2017-03-07 NOTE — Assessment & Plan Note (Signed)
I have some time to discuss goals of care with the patient and her daughter The patient has background history of dementia but appears to be coping well Even though interim surgery is usually suggested for this kind of disease, given her age and dementia, sometimes surgery is not offered for those reasons In any case, if surgery is not indicated, we will proceed to give her 6 cycles of chemotherapy She may still need additional treatment if surgery is offered

## 2017-03-07 NOTE — Assessment & Plan Note (Signed)
She tolerated last cycle of treatment very well Tumor markers are improving and CT scan show improved disease control I will reach out to GYN oncology team to see if interim surgery is appropriate Given her age and other comorbidities, if, for some reasons, surgery is not indicated at this point, we will proceed with cycle 5 of chemotherapy in 3 weeks

## 2017-03-07 NOTE — Assessment & Plan Note (Signed)
She has persistent pleural effusion, reduced and she is not symptomatic Observe only

## 2017-03-07 NOTE — Progress Notes (Signed)
[place] OFFICE PROGRESS NOTE  Patient Care Team: Monico Blitz, MD as PCP - General (Internal Medicine)  SUMMARY OF ONCOLOGIC HISTORY:   Ovarian cancer on right [facility])   12/07/2016 Imaging    Ct angiogram:  No evidence of pulmonary embolus.  Large left and small right pleural effusions. Complete collapse of the left lower lobe, partial collapse of the lingula and milder atelectasis of the left upper lobe, likely due to compressive atelectasis. The collapsed lung parenchyma is poorly evaluated, but no obvious pulmonary masses are seen.  Interstitial pulmonary edema.  Small volume abdominal ascites.  Heterogeneous appearance of the thyroid gland, with right sided nodules.  Aortic Atherosclerosis (ICD10-I70.0).      12/07/2016 - 12/13/2016 Hospital Admission    She was admitted for generalized malaise, abnormal blood work, large bilateral pleural effusion and possible right ovarian cancer      12/08/2016 Imaging    LV EF: 65% -   70%      12/08/2016 Pathology Results    PLEURAL FLUID, LEFT (SPECIMEN 1 OF 1, COLLECTED ON 12/08/16): - MALIGNANT CELLS CONSISTENT WITH METASTATIC ADENOCARCINOMA, SEE COMMENT      12/08/2016 Procedure    Successful ultrasound guided left thoracentesis yielding 1.6 L of pleural fluid. The procedure was terminated early secondary to significant coughing.      12/12/2016 Imaging    CT scan of abdomen and pelvis 1. Septated right adnexal cyst measuring 8.5 x 6.5 cm, concerning for ovarian malignancy in this setting. Small volume complex intra-abdominal ascites which tracks in the pericolic gutters and anterior omentum. No definite soft tissue omental caking. 2. Endometrial thickening versus fluid in the endometrial canal, nonspecific. Rounded 3.9 cm lesion in the uterine fundus may be a fibroid, however would be unusual for patient's age. Endometrial/ uterine neoplasm is considered. 3. Nonspecific ill-defined low-density lesion in the right lobe  of the liver. Probable hepatic hemangioma at the dome. 4. Further evaluation of the GYN structures could be performed with ultrasound. MRI may provide further detail if needed for potential surgical planning. 5. Aortic atherosclerosis      12/12/2016 Imaging    CXR 1. Progressive left base atelectasis and infiltrate. Small left pleural effusion again noted. 2.  Cardiomegaly, no evidence of pulmonary venous congestion.      12/13/2016 Tumor Marker    Patient's tumor was tested for the following markers: CA-125. Results of the tumor marker test revealed 1310.      02-Nov-202018 Procedure    Ultrasound and fluoroscopically guided right internal jugular single lumen power port catheter insertion. Tip in the SVC/RA junction. Catheter ready for use.      01/01/2017 -  Chemotherapy    She received carboplatin and Taxol      01/07/2017 - 01/11/2017 Hospital Admission    She was admitted for severe nausea/vomiting and pleural effusion.      01/08/2017 Procedure    Successful ultrasound guided left thoracentesis yielding 1750 mL of pleural fluid      01/08/2017 Imaging    Marked improvement in left pleural effusion following thoracentesis. Negative for pneumothorax      01/22/2017 Adverse Reaction    Second dose of chemo requires significant dose adjustment and GCSF support      02/13/2017 Tumor Marker    Patient's tumor was tested for the following markers: CA-125. Results of the tumor marker test revealed 310.6      03/05/2017 Imaging    1. Persistent mild ventral peritoneal / omental nodularity  in the LEFT abdomen concerning for peritoneal carcinomatosis. 2. Interval reduction in free fluid the abdomen pelvis. 3. Slight decrease in volume of large RIGHT adnexal cystic mass. 4. Stable LEFT pleural effusion.      03/06/2017 Tumor Marker    Patient's tumor was tested for the following markers: CA-125. Results of the tumor marker test revealed 128.1       INTERVAL HISTORY: Please see  below for problem oriented charting. She returns for cycle 4 of chemotherapy and to review of CT imaging results The patient did not  report recent nausea, vomiting, constipation or peripheral neuropathy She denies chest pain or shortness of breath In fact, she tolerated last cycle of treatment fairly well  REVIEW OF SYSTEMS:   Constitutional: Denies fevers, chills or abnormal weight loss Eyes: Denies blurriness of vision Ears, nose, mouth, throat, and face: Denies mucositis or sore throat Respiratory: Denies cough, dyspnea or wheezes Cardiovascular: Denies palpitation, chest discomfort or lower extremity swelling Gastrointestinal:  Denies nausea, heartburn or change in bowel habits Skin: Denies abnormal skin rashes Lymphatics: Denies new lymphadenopathy or easy bruising Neurological:Denies numbness, tingling or new weaknesses Behavioral/Psych: Mood is stable, no new changes  All other systems were reviewed with the patient and are negative.  I have reviewed the past medical history, past surgical history, social history and family history with the patient and they are unchanged from previous note.  ALLERGIES:  is allergic to tetanus toxoids and codeine.  MEDICATIONS:  Current Outpatient Prescriptions  Medication Sig Dispense Refill  . aspirin EC 81 MG tablet Take 81 mg by mouth daily.    . bisoprolol (ZEBETA) 10 MG tablet Take 10 mg by mouth daily.    . Cholecalciferol (VITAMIN D) 2000 units tablet Take 2,000 Units by mouth daily.    Marland Kitchen dexamethasone (DECADRON) 4 MG tablet 5 tabs the night before and 5 tabs in the morning of chemo, take with food, every 3 weeks 30 tablet 1  . furosemide (LASIX) 20 MG tablet Take 1 tablet (20 mg total) by mouth daily. 30 tablet 0  . lidocaine-prilocaine (EMLA) cream Apply to affected area once 30 g 3  . Multiple Vitamins-Minerals (MULTI FOR HER 50+) TABS Take 1 tablet by mouth daily.    Marland Kitchen nystatin (MYCOSTATIN) 100000 UNIT/ML suspension Take 5 mLs  (500,000 Units total) by mouth 4 (four) times daily. Stop 48 hours after disappearance of white spots. 473 mL 0  . Omega-3 Fatty Acids (FISH OIL) 500 MG CAPS Take 1 capsule by mouth daily.    . ondansetron (ZOFRAN) 8 MG tablet Take three times a day as needed 30 tablet 1  . prochlorperazine (COMPAZINE) 10 MG tablet Take 1 tablet (10 mg total) by mouth every 6 (six) hours as needed (Nausea or vomiting). 30 tablet 1  . simvastatin (ZOCOR) 20 MG tablet Take 20 mg by mouth at bedtime.      No current facility-administered medications for this visit.     PHYSICAL EXAMINATION: ECOG PERFORMANCE STATUS: 1 - Symptomatic but completely ambulatory  Vitals:   03/06/17 0856  BP: (!) 132/57  Pulse: 70  Resp: 18  Temp: 97.9 F (36.6 C)  SpO2: 98%   Filed Weights   03/06/17 0856  Weight: 141 lb 8 oz (64.2 kg)    GENERAL:alert, no distress and comfortable SKIN: skin color, texture, turgor are normal, no rashes or significant lesions EYES: normal, Conjunctiva are pink and non-injected, sclera clear OROPHARYNX:no exudate, no erythema and lips, buccal mucosa, and tongue normal  NECK: supple, thyroid normal size, non-tender, without nodularity LYMPH:  no palpable lymphadenopathy in the cervical, axillary or inguinal LUNGS: clear to auscultation and percussion with normal breathing effort HEART: regular rate & rhythm and no murmurs and no lower extremity edema ABDOMEN:abdomen soft, non-tender and normal bowel sounds Musculoskeletal:no cyanosis of digits and no clubbing  NEURO: alert & oriented x 3 with fluent speech, no focal motor/sensory deficits  LABORATORY DATA:  I have reviewed the data as listed    Component Value Date/Time   NA 138 03/06/2017 0818   K 3.9 03/06/2017 0818   CL 99 (L) 01/11/2017 0610   CO2 25 03/06/2017 0818   GLUCOSE 153 (H) 03/06/2017 0818   BUN 12.0 03/06/2017 0818   CREATININE 0.9 03/06/2017 0818   CALCIUM 10.3 03/06/2017 0818   PROT 7.2 03/06/2017 0818    ALBUMIN 3.7 03/06/2017 0818   AST 19 03/06/2017 0818   ALT 18 03/06/2017 0818   ALKPHOS 100 03/06/2017 0818   BILITOT 0.55 03/06/2017 0818   GFRNONAA >60 01/11/2017 0610   GFRAA >60 01/11/2017 0610    No results found for: SPEP, UPEP  Lab Results  Component Value Date   WBC 8.1 03/06/2017   NEUTROABS 6.2 03/06/2017   HGB 10.6 (L) 03/06/2017   HCT 32.2 (L) 03/06/2017   MCV 100.9 03/06/2017   PLT 211 03/06/2017      Chemistry      Component Value Date/Time   NA 138 03/06/2017 0818   K 3.9 03/06/2017 0818   CL 99 (L) 01/11/2017 0610   CO2 25 03/06/2017 0818   BUN 12.0 03/06/2017 0818   CREATININE 0.9 03/06/2017 0818      Component Value Date/Time   CALCIUM 10.3 03/06/2017 0818   ALKPHOS 100 03/06/2017 0818   AST 19 03/06/2017 0818   ALT 18 03/06/2017 0818   BILITOT 0.55 03/06/2017 0818       RADIOGRAPHIC STUDIES: I have reviewed multiple imaging study with the patient and her daughter I have personally reviewed the radiological images as listed and agreed with the findings in the report. Ct Abdomen Pelvis W Contrast  Result Date: 03/05/2017 CLINICAL DATA:  Ovarian cancer diagnosed July 2018 EXAM: CT ABDOMEN AND PELVIS WITH CONTRAST TECHNIQUE: Multidetector CT imaging of the abdomen and pelvis was performed using the standard protocol following bolus administration of intravenous contrast. CONTRAST:  149mL ISOVUE-300 IOPAMIDOL (ISOVUE-300) INJECTION 61% COMPARISON:  CT 12/12/2016 FINDINGS: Lower chest: Stable LEFT effusion. Hepatobiliary: Enhancing lesion in the dome liver measuring 12 mm not changed from prior and favored a benign hemangioma. No discrete nodularity along the surface of the liver. Small amount of fluid surrounds the liver is decreased significantly compared to prior. Gallbladder normal. Common bile duct normal Pancreas: Pancreas is normal. No ductal dilatation. No pancreatic inflammation. Spleen: Normal spleen Adrenals/urinary tract: 9 mm lesion in the cortex  of the RIGHT kidney cannot be fully characterize but not changed from prior. Ureters and bladder normal. Obstructive uropathy Stomach/Bowel: Stomach, small-bowel, and cecum normal. Appendix not identified. Colon and rectosigmoid colon normal. Vascular/Lymphatic: Abdominal aorta is normal caliber with atherosclerotic calcification. There is no retroperitoneal or periportal lymphadenopathy. No pelvic lymphadenopathy. Reproductive: Uterus is enhancing body suggesting leiomyoma. There is a large cystic lesion in the RIGHT adnexa measuring 6.3 by 5.5 cm compared to 8.5 x 6.5 cm. LEFT ovary is small. Other: Interval decrease in intraperitoneal free fluid compared to prior. Versus a omental/peritoneal thickening in the LEFT upper quadrant measuring 33 x 14 mm (  image 34, series 2). Finding is more prominent now than there is reduction in the adjacent peritoneal fluid. No nodularity in the posterior cul-de-sac. Musculoskeletal: No aggressive osseous lesion. IMPRESSION: 1. Persistent mild ventral peritoneal / omental nodularity in the LEFT abdomen concerning for peritoneal carcinomatosis. 2. Interval reduction in free fluid the abdomen pelvis. 3. Slight decrease in volume of large RIGHT adnexal cystic mass. 4. Stable LEFT pleural effusion. Electronically Signed   By: Suzy Bouchard M.D.   On: 03/05/2017 15:18    ASSESSMENT & PLAN:  Ovarian cancer on right [facility]) She tolerated last cycle of treatment very well Tumor markers are improving and CT scan show improved disease control I will reach out to GYN oncology team to see if interim surgery is appropriate Given her age and other comorbidities, if, for some reasons, surgery is not indicated at this point, we will proceed with cycle 5 of chemotherapy in 3 weeks  Malignant pleural effusion She has persistent pleural effusion, reduced and she is not symptomatic Observe only  Goals of care, counseling/discussion I have some time to discuss goals of care with the  patient and her daughter The patient has background history of dementia but appears to be coping well Even though interim surgery is usually suggested for this kind of disease, given her age and dementia, sometimes surgery is not offered for those reasons In any case, if surgery is not indicated, we will proceed to give her 6 cycles of chemotherapy She may still need additional treatment if surgery is offered   No orders of the defined types were placed in this encounter.  All questions were answered. The patient knows to call the clinic with any problems, questions or concerns. No barriers to learning was detected. I spent 20 minutes counseling the patient face to face. The total time spent in the appointment was 25 minutes and more than 50% was on counseling and review of test results     Heath Lark, MD 03/07/2017 9:14 AM

## 2017-03-11 ENCOUNTER — Telehealth: Payer: Self-pay | Admitting: *Deleted

## 2017-03-11 NOTE — Telephone Encounter (Signed)
Contacted the patient's daughter and gave the appt for October 9th at 2pm.

## 2017-03-18 ENCOUNTER — Encounter: Payer: Self-pay | Admitting: Gynecologic Oncology

## 2017-03-18 ENCOUNTER — Ambulatory Visit: Payer: Medicare Other | Attending: Gynecologic Oncology | Admitting: Gynecologic Oncology

## 2017-03-18 VITALS — BP 125/67 | HR 70 | Temp 97.5°F | Resp 18

## 2017-03-18 DIAGNOSIS — F0390 Unspecified dementia without behavioral disturbance: Secondary | ICD-10-CM | POA: Diagnosis not present

## 2017-03-18 DIAGNOSIS — Z9221 Personal history of antineoplastic chemotherapy: Secondary | ICD-10-CM | POA: Diagnosis not present

## 2017-03-18 DIAGNOSIS — I10 Essential (primary) hypertension: Secondary | ICD-10-CM | POA: Diagnosis not present

## 2017-03-18 DIAGNOSIS — C561 Malignant neoplasm of right ovary: Secondary | ICD-10-CM | POA: Diagnosis not present

## 2017-03-18 DIAGNOSIS — E785 Hyperlipidemia, unspecified: Secondary | ICD-10-CM | POA: Diagnosis not present

## 2017-03-18 DIAGNOSIS — Z7982 Long term (current) use of aspirin: Secondary | ICD-10-CM | POA: Diagnosis not present

## 2017-03-18 DIAGNOSIS — Z79899 Other long term (current) drug therapy: Secondary | ICD-10-CM | POA: Diagnosis not present

## 2017-03-18 NOTE — Progress Notes (Signed)
Consult Note: Gyn-Onc  Consult was requested by Dr. Karleen Hampshire for the evaluation of Denise Bonilla 83 y.o. female  CC:  Chief Complaint  Patient presents with  . Ovarian cancer on right [facility])    Assessment/Plan:  Denise Bonilla  is a 83 y.o.  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  I feel that it is reasonable to consider an interval debulking surgery. I think she would be a candidate for a minimally invasive approach.  I recommend 1 more cycle of chemotherapy (on 10/18) and then, 3 weeks after that (on 04/22/17) we would attempt robotic assisted hysterectomy, BSO, omentectomy with likely needing minilaparotomy for omental debulking. I feel that she is likely to recover well from that surgery and would recommend 2 additional cycles of chemotherapy postop.  I discussed risks of surgery including  bleeding, infection, damage to internal organs (such as bladder,ureters, bowels), blood clot, reoperation and rehospitalization. I discussed that surgical complications can prevent patients from resuming chemotherapy in a timely fashion.   We will repeat CT imaging in the 1st week of November to better prepare for surgery.  HPI: Denise Bonilla is a 83 year old P3 who is seen in consultation at the request of Dr Karleen Hampshire for presumed stage IV right ovarian cancer.  The patient has been having nausea and anorexia (but without weight loss or GI obstruction symptoms) since June, 2018. Her daughter took her to an ED for this where she was treated for UTI. Chest imaging had revealed a large pleural effusion. She had a small right pleural effusion.   CT chest/abdo/pelvis on 12/12/16 showed a left pleural effusion, tiny right pleural effusion. Nonspecific ill defined low density in the inferior right lobe of the liver. No bowel obstruction. Large right adnexal cystic structure measures 8.5x6.5cm with internal septations. Left ovary not well  definied. No gross left adnexal mass. Uterus is abnormal in appearance for age with endometrial thinkening and fluid in the canal. Posterior heterogeneous lesion in the uterus may be fibroid. Small volume of upper abdominal ascites.   Cytology from the thoracentesis on 12/08/16 showed metastatic adenocarcinoma with positive stains for CK7, Pax 8, WT1, negative for CK 20 and ER. Overall favored gyn primary.  CA 125 on 12/13/16 was elevated at 1310 CA 19-9 was elevated at 357.  The patient has had a prior left ovarian cystectomy (possible LSO) remotely in the past for a benign ovarian cyst.  She has a history of dementia but is currently living with her daughter and fairly independent with most activities. She is a fair historian.  Interval Hx:  She went on 2 receive 4 cycles of carb/tax chemotherapy ( 01/01/17 - 03/06/17).  She required admission after cycle 1 for nausea and emesis. She has improved PS after each cycle and is now doing well with chemotherapy.  CA 125 was 128 on 03/06/17 (day 1 of cycle 4).  CT abdo/pelvison 03/05/17 showed a persistent right ovarian cyst measuring 8.5cm with decrease in ascites, and persistent but improved left omental thickening.   Current Meds:  Outpatient Encounter Prescriptions as of 03/18/2017  Medication Sig  . aspirin EC 81 MG tablet Take 81 mg by mouth daily.  . bisoprolol (ZEBETA) 10 MG tablet Take 10 mg by mouth daily.  . Cholecalciferol (VITAMIN D) 2000 units tablet Take 2,000 Units by mouth daily.  Marland Kitchen dexamethasone (DECADRON) 4 MG tablet 5 tabs the night before and 5 tabs  in the morning of chemo, take with food, every 3 weeks  . furosemide (LASIX) 20 MG tablet Take 1 tablet (20 mg total) by mouth daily.  Marland Kitchen lidocaine-prilocaine (EMLA) cream Apply to affected area once  . Multiple Vitamins-Minerals (MULTI FOR HER 50+) TABS Take 1 tablet by mouth daily.  Marland Kitchen nystatin (MYCOSTATIN) 100000 UNIT/ML suspension Take 5 mLs (500,000 Units total) by mouth 4 (four)  times daily. Stop 48 hours after disappearance of white spots.  . Omega-3 Fatty Acids (FISH OIL) 500 MG CAPS Take 1 capsule by mouth daily.  . ondansetron (ZOFRAN) 8 MG tablet Take three times a day as needed  . prochlorperazine (COMPAZINE) 10 MG tablet Take 1 tablet (10 mg total) by mouth every 6 (six) hours as needed (Nausea or vomiting).  . simvastatin (ZOCOR) 20 MG tablet Take 20 mg by mouth at bedtime.    No facility-administered encounter medications on file as of 03/18/2017.     Allergy:  Allergies  Allergen Reactions  . Tetanus Toxoids Anaphylaxis  . Codeine Nausea And Vomiting    Social Hx:   Social History   Social History  . Marital status: Widowed    Spouse name: N/A  . Number of children: 3  . Years of education: N/A   Occupational History  . retired    Social History Main Topics  . Smoking status: Never Smoker  . Smokeless tobacco: Never Used  . Alcohol use No  . Drug use: No  . Sexual activity: Not on file   Other Topics Concern  . Not on file   Social History Narrative  . No narrative on file    Past Surgical Hx:  Past Surgical History:  Procedure Laterality Date  . CYSTECTOMY    . IR FLUORO GUIDE PORT INSERTION RIGHT  2020-03-717  . IR US GUIDE VASC ACCESS RIGHT  2020-03-717    Past Medical Hx:  Past Medical History:  Diagnosis Date  . Dementia   . Hyperlipidemia   . Hypertension   . Malignant pleural effusion    Left  . Ovarian cancer on right [facility]) 12/23/2016    Past Gynecological History:  SVDx 3 No LMP recorded. Patient is postmenopausal.  Family Hx:  Family History  Problem Relation Age of Onset  . Cancer Other   . Hyperlipidemia Other   . Hypertension Other   . Heart failure Mother   . Breast cancer Sister     Review of Systems:  Constitutional  Feels well,    ENT Normal appearing ears and nares bilaterally Skin/Breast  No rash, sores, jaundice, itching, dryness Cardiovascular  No chest pain, shortness of breath, or  edema  Pulmonary  No cough or wheeze.  Gastro Intestinal  No nausea, vomitting, or diarrhoea. No bright red blood per rectum, no abdominal pain, change in bowel movement, or constipation.  Genito Urinary  No frequency, urgency, dysuria, no bleeding Musculo Skeletal  No myalgia, arthralgia, joint swelling or pain  Neurologic  No weakness, numbness, change in gait,  Psychology  No depression, anxiety, insomnia.   Vitals:  Blood pressure 125/67, pulse 70, temperature (!) 97.5 F (36.4 C), temperature source Oral, resp. rate 18, SpO2 100 %.  Physical Exam: WD in NAD Neck  Supple NROM, without any enlargements.  Lymph Node Survey No cervical supraclavicular or inguinal adenopathy Cardiovascular  Pulse normal rate, regularity and rhythm. S1 and S2 normal.  Lungs  Clear to auscultation bilateraly, without wheezes/crackles/rhonchi. Good air movement.  Skin  No rash/lesions/breakdown  Psychiatry  Alert and oriented to person, place, and time  Abdomen  Normoactive bowel sounds, abdomen soft, non-tender and nonobese without evidence of hernia.  Back No CVA tenderness Genito Urinary  Vulva/vagina: Normal external female genitalia.  No lesions. No discharge or bleeding.  Bladder/urethra:  No lesions or masses, well supported bladder  Vagina: normal  Cervix: Normal appearing, no lesions.  Uterus:  Small, mobile, no parametrial involvement or nodularity.  Adnexa: no palpable masses. Rectal  Good tone, no masses no cul de sac nodularity. Large burden of stool. Extremities  No bilateral cyanosis, clubbing or edema.   Donaciano Eva, MD  03/18/2017, 5:49 PM

## 2017-03-18 NOTE — Patient Instructions (Addendum)
Plan on having another cycle of chemotherapy on October 18 then no more until after recovered from surgery.  We will also plan for you to have a CT scan of the abdomen and pelvis with no oral contrast on Nov 6.  Liquids only 4 hours prior to your procedure.  STOP TAKING ASPIRIN NOW               Preparing for your Surgery  Plan for surgery on April 22, 2017 with Dr. Everitt Amber at [place] will be scheduled for a robotic assisted total hysterectomy, bilateral salpingo-oophorectomy, omentectomy, mini-laparotomy.  You will need to drink two bottles of magnesium citrate the day before surgery starting at 4pm.    Pre-operative Testing -You will receive a phone call from presurgical testing at [facility] to arrange for a pre-operative testing appointment before your surgery.  This appointment normally occurs one to two weeks before your scheduled surgery.   -Bring your insurance card, copy of an advanced directive if applicable, medication list  -At that visit, you will be asked to sign a consent for a possible blood transfusion in case a transfusion becomes necessary during surgery.  The need for a blood transfusion is rare but having consent is a necessary part of your care.     -You should not be taking blood thinners or aspirin at least ten days prior to surgery unless instructed by your surgeon.  Day Before Surgery at [place] will be asked to take in a light diet the day before surgery.  Avoid carbonated beverages.  You will be advised to have nothing to eat or drink after midnight the evening before.    Eat a light diet the day before surgery.  Examples including soups, broths, toast, yogurt, mashed potatoes.  Things to avoid include carbonated beverages (fizzy beverages), raw fruits and raw vegetables, or beans.   If your bowels are filled with gas, your surgeon will have difficulty visualizing your pelvic organs which increases your surgical  risks.  STARTING AT 4PM THE DAY BEFORE SURGERY, START DRINKING TWO BOTTLES OF MAGNESIUM CITRATE AND TAKE IN ONLY CLEAR LIQUIDS.    Your role in recovery Your role is to become active as soon as directed by your doctor, while still giving yourself time to heal.  Rest when you feel tired. You will be asked to do the following in order to speed your recovery:  - Cough and breathe deeply. This helps toclear and expand your lungs and can prevent pneumonia. You may be given a spirometer to practice deep breathing. A staff member will show you how to use the spirometer. - Do mild physical activity. Walking or moving your legs help your circulation and body functions return to normal. A staff member will help you when you try to walk and will provide you with simple exercises. Do not try to get up or walk alone the first time. - Actively manage your pain. Managing your pain lets you move in comfort. We will ask you to rate your pain on a scale of zero to 10. It is your responsibility to tell your doctor or nurse where and how much you hurt so your pain can be treated.  Special Considerations -If you are diabetic, you may be placed on insulin after surgery to have closer control over your blood sugars to promote healing and recovery.  This does not mean that you will be discharged on insulin.  If applicable, your oral antidiabetics  will be resumed when you are tolerating a solid diet.  -Your final pathology results from surgery should be available by the Friday after surgery and the results will be relayed to you when available.  -Dr. Lahoma Crocker is the Surgeon that assists your GYN Oncologist with surgery.  The next day after your surgery you will either see your GYN Oncologist or Dr. Lahoma Crocker.   Blood Transfusion Information WHAT IS A BLOOD TRANSFUSION? A transfusion is the replacement of blood or some of its parts. Blood is made up of multiple cells which provide different  functions.  Red blood cells carry oxygen and are used for blood loss replacement.  White blood cells fight against infection.  Platelets control bleeding.  Plasma helps clot blood.  Other blood products are available for specialized needs, such as hemophilia or other clotting disorders. BEFORE THE TRANSFUSION  Who gives blood for transfusions?   You may be able to donate blood to be used at a later date on yourself (autologous donation).  Relatives can be asked to donate blood. This is generally not any safer than if you have received blood from a stranger. The same precautions are taken to ensure safety when a relative's blood is donated.  Healthy volunteers who are fully evaluated to make sure their blood is safe. This is blood bank blood. Transfusion therapy is the safest it has ever been in the practice of medicine. Before blood is taken from a donor, a complete history is taken to make sure that person has no history of diseases nor engages in risky social behavior (examples are intravenous drug use or sexual activity with multiple partners). The donor's travel history is screened to minimize risk of transmitting infections, such as malaria. The donated blood is tested for signs of infectious diseases, such as HIV and hepatitis. The blood is then tested to be sure it is compatible with you in order to minimize the chance of a transfusion reaction. If you or a relative donates blood, this is often done in anticipation of surgery and is not appropriate for emergency situations. It takes many days to process the donated blood. RISKS AND COMPLICATIONS Although transfusion therapy is very safe and saves many lives, the main dangers of transfusion include:   Getting an infectious disease.  Developing a transfusion reaction. This is an allergic reaction to something in the blood you were given. Every precaution is taken to prevent this. The decision to have a blood transfusion has been  considered carefully by your caregiver before blood is given. Blood is not given unless the benefits outweigh the risks.

## 2017-03-27 ENCOUNTER — Encounter: Payer: Self-pay | Admitting: Hematology and Oncology

## 2017-03-27 ENCOUNTER — Other Ambulatory Visit (HOSPITAL_BASED_OUTPATIENT_CLINIC_OR_DEPARTMENT_OTHER): Payer: Medicare Other

## 2017-03-27 ENCOUNTER — Ambulatory Visit: Payer: Medicare Other

## 2017-03-27 ENCOUNTER — Ambulatory Visit (HOSPITAL_BASED_OUTPATIENT_CLINIC_OR_DEPARTMENT_OTHER): Payer: Medicare Other | Admitting: Hematology and Oncology

## 2017-03-27 ENCOUNTER — Ambulatory Visit (HOSPITAL_BASED_OUTPATIENT_CLINIC_OR_DEPARTMENT_OTHER): Payer: Medicare Other

## 2017-03-27 VITALS — BP 136/66 | HR 60 | Temp 97.8°F | Resp 17

## 2017-03-27 DIAGNOSIS — Z5111 Encounter for antineoplastic chemotherapy: Secondary | ICD-10-CM

## 2017-03-27 DIAGNOSIS — D6481 Anemia due to antineoplastic chemotherapy: Secondary | ICD-10-CM

## 2017-03-27 DIAGNOSIS — C561 Malignant neoplasm of right ovary: Secondary | ICD-10-CM

## 2017-03-27 DIAGNOSIS — T451X5A Adverse effect of antineoplastic and immunosuppressive drugs, initial encounter: Secondary | ICD-10-CM | POA: Insufficient documentation

## 2017-03-27 DIAGNOSIS — K5909 Other constipation: Secondary | ICD-10-CM | POA: Diagnosis not present

## 2017-03-27 DIAGNOSIS — Z95828 Presence of other vascular implants and grafts: Secondary | ICD-10-CM

## 2017-03-27 LAB — CBC WITH DIFFERENTIAL/PLATELET
BASO%: 0 % (ref 0.0–2.0)
Basophils Absolute: 0 10*3/uL (ref 0.0–0.1)
EOS%: 0 % (ref 0.0–7.0)
Eosinophils Absolute: 0 10*3/uL (ref 0.0–0.5)
HEMATOCRIT: 30.7 % — AB (ref 34.8–46.6)
HEMOGLOBIN: 10.4 g/dL — AB (ref 11.6–15.9)
LYMPH#: 1.4 10*3/uL (ref 0.9–3.3)
LYMPH%: 19.5 % (ref 14.0–49.7)
MCH: 34.3 pg — ABNORMAL HIGH (ref 25.1–34.0)
MCHC: 33.9 g/dL (ref 31.5–36.0)
MCV: 101.3 fL — ABNORMAL HIGH (ref 79.5–101.0)
MONO#: 0.1 10*3/uL (ref 0.1–0.9)
MONO%: 0.7 % (ref 0.0–14.0)
NEUT#: 5.6 10*3/uL (ref 1.5–6.5)
NEUT%: 79.8 % — ABNORMAL HIGH (ref 38.4–76.8)
Platelets: 171 10*3/uL (ref 145–400)
RBC: 3.03 10*6/uL — ABNORMAL LOW (ref 3.70–5.45)
RDW: 15.7 % — AB (ref 11.2–14.5)
WBC: 7 10*3/uL (ref 3.9–10.3)

## 2017-03-27 LAB — COMPREHENSIVE METABOLIC PANEL
ALBUMIN: 3.7 g/dL (ref 3.5–5.0)
ALK PHOS: 76 U/L (ref 40–150)
ALT: 16 U/L (ref 0–55)
AST: 18 U/L (ref 5–34)
Anion Gap: 11 mEq/L (ref 3–11)
BILIRUBIN TOTAL: 0.49 mg/dL (ref 0.20–1.20)
BUN: 17.3 mg/dL (ref 7.0–26.0)
CALCIUM: 9.4 mg/dL (ref 8.4–10.4)
CO2: 22 mEq/L (ref 22–29)
CREATININE: 0.8 mg/dL (ref 0.6–1.1)
Chloride: 102 mEq/L (ref 98–109)
EGFR: >60 mL/min/{1.73_m2} (ref >60)
Glucose: 202 mg/dl — ABNORMAL HIGH (ref 70–140)
Potassium: 3.7 mEq/L (ref 3.5–5.1)
SODIUM: 135 meq/L — AB (ref 136–145)
TOTAL PROTEIN: 7 g/dL (ref 6.4–8.3)

## 2017-03-27 MED ORDER — PALONOSETRON HCL INJECTION 0.25 MG/5ML
INTRAVENOUS | Status: AC
  Filled 2017-03-27: qty 5

## 2017-03-27 MED ORDER — DIPHENHYDRAMINE HCL 50 MG/ML IJ SOLN
INTRAMUSCULAR | Status: AC
  Filled 2017-03-27: qty 1

## 2017-03-27 MED ORDER — SODIUM CHLORIDE 0.9% FLUSH
10.0000 mL | Freq: Once | INTRAVENOUS | Status: AC
  Administered 2017-03-27: 10 mL
  Filled 2017-03-27: qty 10

## 2017-03-27 MED ORDER — PEGFILGRASTIM 6 MG/0.6ML ~~LOC~~ PSKT
6.0000 mg | PREFILLED_SYRINGE | Freq: Once | SUBCUTANEOUS | Status: AC
  Administered 2017-03-27: 6 mg via SUBCUTANEOUS
  Filled 2017-03-27: qty 0.6

## 2017-03-27 MED ORDER — SODIUM CHLORIDE 0.9 % IV SOLN
Freq: Once | INTRAVENOUS | Status: AC
  Administered 2017-03-27: 10:00:00 via INTRAVENOUS
  Filled 2017-03-27: qty 5

## 2017-03-27 MED ORDER — SODIUM CHLORIDE 0.9 % IV SOLN
Freq: Once | INTRAVENOUS | Status: AC
  Administered 2017-03-27: 10:00:00 via INTRAVENOUS

## 2017-03-27 MED ORDER — SODIUM CHLORIDE 0.9 % IV SOLN
131.2500 mg/m2 | Freq: Once | INTRAVENOUS | Status: AC
  Administered 2017-03-27: 228 mg via INTRAVENOUS
  Filled 2017-03-27: qty 38

## 2017-03-27 MED ORDER — HEPARIN SOD (PORK) LOCK FLUSH 100 UNIT/ML IV SOLN
500.0000 [IU] | Freq: Once | INTRAVENOUS | Status: AC | PRN
  Administered 2017-03-27: 500 [IU]
  Filled 2017-03-27: qty 5

## 2017-03-27 MED ORDER — SODIUM CHLORIDE 0.9% FLUSH
10.0000 mL | INTRAVENOUS | Status: DC | PRN
  Administered 2017-03-27: 10 mL
  Filled 2017-03-27: qty 10

## 2017-03-27 MED ORDER — PALONOSETRON HCL INJECTION 0.25 MG/5ML
0.2500 mg | Freq: Once | INTRAVENOUS | Status: AC
  Administered 2017-03-27: 0.25 mg via INTRAVENOUS

## 2017-03-27 MED ORDER — FAMOTIDINE IN NACL 20-0.9 MG/50ML-% IV SOLN
20.0000 mg | Freq: Once | INTRAVENOUS | Status: AC
  Administered 2017-03-27: 20 mg via INTRAVENOUS

## 2017-03-27 MED ORDER — DIPHENHYDRAMINE HCL 50 MG/ML IJ SOLN
50.0000 mg | Freq: Once | INTRAMUSCULAR | Status: AC
  Administered 2017-03-27: 50 mg via INTRAVENOUS

## 2017-03-27 MED ORDER — FAMOTIDINE IN NACL 20-0.9 MG/50ML-% IV SOLN
INTRAVENOUS | Status: AC
  Filled 2017-03-27: qty 50

## 2017-03-27 MED ORDER — SODIUM CHLORIDE 0.9 % IV SOLN
317.7000 mg | Freq: Once | INTRAVENOUS | Status: AC
  Administered 2017-03-27: 320 mg via INTRAVENOUS
  Filled 2017-03-27: qty 32

## 2017-03-27 NOTE — Assessment & Plan Note (Signed)
This is likely due to recent treatment. The patient denies recent history of bleeding such as epistaxis, hematuria or hematochezia. She is asymptomatic from the anemia. I will observe for now.   

## 2017-03-27 NOTE — Assessment & Plan Note (Signed)
We discussed the importance of aggressive laxative therapy 

## 2017-03-27 NOTE — Progress Notes (Signed)
[facility] OFFICE PROGRESS NOTE  Patient Care Team: Monico Blitz, MD as PCP - General (Internal Medicine)  SUMMARY OF ONCOLOGIC HISTORY:   Ovarian cancer on right [facility])   12/07/2016 Imaging    Ct angiogram:  No evidence of pulmonary embolus.  Large left and small right pleural effusions. Complete collapse of the left lower lobe, partial collapse of the lingula and milder atelectasis of the left upper lobe, likely due to compressive atelectasis. The collapsed lung parenchyma is poorly evaluated, but no obvious pulmonary masses are seen.  Interstitial pulmonary edema.  Small volume abdominal ascites.  Heterogeneous appearance of the thyroid gland, with right sided nodules.  Aortic Atherosclerosis (ICD10-I70.0).      12/07/2016 - 12/13/2016 Hospital Admission    She was admitted for generalized malaise, abnormal blood work, large bilateral pleural effusion and possible right ovarian cancer      12/08/2016 Imaging    LV EF: 65% -   70%      12/08/2016 Pathology Results    PLEURAL FLUID, LEFT (SPECIMEN 1 OF 1, COLLECTED ON 12/08/16): - MALIGNANT CELLS CONSISTENT WITH METASTATIC ADENOCARCINOMA, SEE COMMENT      12/08/2016 Procedure    Successful ultrasound guided left thoracentesis yielding 1.6 L of pleural fluid. The procedure was terminated early secondary to significant coughing.      12/12/2016 Imaging    CT scan of abdomen and pelvis 1. Septated right adnexal cyst measuring 8.5 x 6.5 cm, concerning for ovarian malignancy in this setting. Small volume complex intra-abdominal ascites which tracks in the pericolic gutters and anterior omentum. No definite soft tissue omental caking. 2. Endometrial thickening versus fluid in the endometrial canal, nonspecific. Rounded 3.9 cm lesion in the uterine fundus may be a fibroid, however would be unusual for patient's age. Endometrial/ uterine neoplasm is considered. 3. Nonspecific ill-defined low-density lesion in the right lobe  of the liver. Probable hepatic hemangioma at the dome. 4. Further evaluation of the GYN structures could be performed with ultrasound. MRI may provide further detail if needed for potential surgical planning. 5. Aortic atherosclerosis      12/12/2016 Imaging    CXR 1. Progressive left base atelectasis and infiltrate. Small left pleural effusion again noted. 2.  Cardiomegaly, no evidence of pulmonary venous congestion.      12/13/2016 Tumor Marker    Patient's tumor was tested for the following markers: CA-125. Results of the tumor marker test revealed 1310.      02-Nov-202018 Procedure    Ultrasound and fluoroscopically guided right internal jugular single lumen power port catheter insertion. Tip in the SVC/RA junction. Catheter ready for use.      01/01/2017 -  Chemotherapy    She received carboplatin and Taxol      01/07/2017 - 01/11/2017 Hospital Admission    She was admitted for severe nausea/vomiting and pleural effusion.      01/08/2017 Procedure    Successful ultrasound guided left thoracentesis yielding 1750 mL of pleural fluid      01/08/2017 Imaging    Marked improvement in left pleural effusion following thoracentesis. Negative for pneumothorax      01/22/2017 Adverse Reaction    Second dose of chemo requires significant dose adjustment and GCSF support      02/13/2017 Tumor Marker    Patient's tumor was tested for the following markers: CA-125. Results of the tumor marker test revealed 310.6      03/05/2017 Imaging    1. Persistent mild ventral peritoneal / omental nodularity  in the LEFT abdomen concerning for peritoneal carcinomatosis. 2. Interval reduction in free fluid the abdomen pelvis. 3. Slight decrease in volume of large RIGHT adnexal cystic mass. 4. Stable LEFT pleural effusion.      03/06/2017 Tumor Marker    Patient's tumor was tested for the following markers: CA-125. Results of the tumor marker test revealed 128.1       INTERVAL HISTORY: Please see  below for problem oriented charting. She returns for further follow-up She feels well No recent side effects from treatment No significant nausea of vomiting Constipation is well controlled with laxatives She denies cough, chest pain or shortness of breath REVIEW OF SYSTEMS:   Constitutional: Denies fevers, chills or abnormal weight loss Eyes: Denies blurriness of vision Ears, nose, mouth, throat, and face: Denies mucositis or sore throat Respiratory: Denies cough, dyspnea or wheezes Cardiovascular: Denies palpitation, chest discomfort or lower extremity swelling Gastrointestinal:  Denies nausea, heartburn or change in bowel habits Skin: Denies abnormal skin rashes Lymphatics: Denies new lymphadenopathy or easy bruising Neurological:Denies numbness, tingling or new weaknesses Behavioral/Psych: Mood is stable, no new changes  All other systems were reviewed with the patient and are negative.  I have reviewed the past medical history, past surgical history, social history and family history with the patient and they are unchanged from previous note.  ALLERGIES:  is allergic to tetanus toxoids and codeine.  MEDICATIONS:  Current Outpatient Prescriptions  Medication Sig Dispense Refill  . aspirin EC 81 MG tablet Take 81 mg by mouth daily.    . bisoprolol (ZEBETA) 10 MG tablet Take 10 mg by mouth daily.    . Cholecalciferol (VITAMIN D) 2000 units tablet Take 2,000 Units by mouth daily.    Marland Kitchen dexamethasone (DECADRON) 4 MG tablet 5 tabs the night before and 5 tabs in the morning of chemo, take with food, every 3 weeks 30 tablet 1  . furosemide (LASIX) 20 MG tablet Take 1 tablet (20 mg total) by mouth daily. 30 tablet 0  . lidocaine-prilocaine (EMLA) cream Apply to affected area once 30 g 3  . Multiple Vitamins-Minerals (MULTI FOR HER 50+) TABS Take 1 tablet by mouth daily.    . Omega-3 Fatty Acids (FISH OIL) 500 MG CAPS Take 1 capsule by mouth daily.    . ondansetron (ZOFRAN) 8 MG tablet  Take three times a day as needed 30 tablet 1  . prochlorperazine (COMPAZINE) 10 MG tablet Take 1 tablet (10 mg total) by mouth every 6 (six) hours as needed (Nausea or vomiting). 30 tablet 1  . simvastatin (ZOCOR) 20 MG tablet Take 20 mg by mouth at bedtime.      No current facility-administered medications for this visit.    Facility-Administered Medications Ordered in Other Visits  Medication Dose Route Frequency Provider Last Rate Last Dose  . CARBOplatin (PARAPLATIN) 320 mg in sodium chloride 0.9 % 250 mL chemo infusion  320 mg Intravenous Once Alvy Bimler, Llewelyn Sheaffer, MD      . heparin lock flush 100 unit/mL  500 Units Intracatheter Once PRN Alvy Bimler, Elsey Holts, MD      . PACLitaxel (TAXOL) 228 mg in sodium chloride 0.9 % 250 mL chemo infusion (> 43m/m2)  131.25 mg/m2 (Treatment Plan Recorded) Intravenous Once Johnathan Tortorelli, MD 96 mL/hr at 03/27/17 1038 228 mg at 03/27/17 1038  . pegfilgrastim (NEULASTA ONPRO KIT) injection 6 mg  6 mg Subcutaneous Once Tierney Behl, MD      . sodium chloride flush (NS) 0.9 % injection 10 mL  10  mL Intracatheter PRN Heath Lark, MD        PHYSICAL EXAMINATION: ECOG PERFORMANCE STATUS: 1 - Symptomatic but completely ambulatory  Vitals:   03/27/17 0820  BP: (!) 146/62  Pulse: 70  Resp: 18  Temp: 97.7 F (36.5 C)  SpO2: 100%   Filed Weights   03/27/17 0820  Weight: 141 lb 12.8 oz (64.3 kg)    GENERAL:alert, no distress and comfortable SKIN: skin color, texture, turgor are normal, no rashes or significant lesions EYES: normal, Conjunctiva are pink and non-injected, sclera clear OROPHARYNX:no exudate, no erythema and lips, buccal mucosa, and tongue normal  NECK: supple, thyroid normal size, non-tender, without nodularity LYMPH:  no palpable lymphadenopathy in the cervical, axillary or inguinal LUNGS: clear to auscultation and percussion with normal breathing effort HEART: regular rate & rhythm and no murmurs and no lower extremity edema ABDOMEN:abdomen soft,  non-tender and normal bowel sounds Musculoskeletal:no cyanosis of digits and no clubbing  NEURO: alert & oriented x 3 with fluent speech, no focal motor/sensory deficits  LABORATORY DATA:  I have reviewed the data as listed    Component Value Date/Time   NA 135 (L) 03/27/2017 0749   K 3.7 03/27/2017 0749   CL 99 (L) 01/11/2017 0610   CO2 22 03/27/2017 0749   GLUCOSE 202 (H) 03/27/2017 0749   BUN 17.3 03/27/2017 0749   CREATININE 0.8 03/27/2017 0749   CALCIUM 9.4 03/27/2017 0749   PROT 7.0 03/27/2017 0749   ALBUMIN 3.7 03/27/2017 0749   AST 18 03/27/2017 0749   ALT 16 03/27/2017 0749   ALKPHOS 76 03/27/2017 0749   BILITOT 0.49 03/27/2017 0749   GFRNONAA >60 01/11/2017 0610   GFRAA >60 01/11/2017 0610    No results found for: SPEP, UPEP  Lab Results  Component Value Date   WBC 7.0 03/27/2017   NEUTROABS 5.6 03/27/2017   HGB 10.4 (L) 03/27/2017   HCT 30.7 (L) 03/27/2017   MCV 101.3 (H) 03/27/2017   PLT 171 03/27/2017      Chemistry      Component Value Date/Time   NA 135 (L) 03/27/2017 0749   K 3.7 03/27/2017 0749   CL 99 (L) 01/11/2017 0610   CO2 22 03/27/2017 0749   BUN 17.3 03/27/2017 0749   CREATININE 0.8 03/27/2017 0749      Component Value Date/Time   CALCIUM 9.4 03/27/2017 0749   ALKPHOS 76 03/27/2017 0749   AST 18 03/27/2017 0749   ALT 16 03/27/2017 0749   BILITOT 0.49 03/27/2017 0749       RADIOGRAPHIC STUDIES: I have personally reviewed the radiological images as listed and agreed with the findings in the report. Ct Abdomen Pelvis W Contrast  Result Date: 03/05/2017 CLINICAL DATA:  Ovarian cancer diagnosed July 2018 EXAM: CT ABDOMEN AND PELVIS WITH CONTRAST TECHNIQUE: Multidetector CT imaging of the abdomen and pelvis was performed using the standard protocol following bolus administration of intravenous contrast. CONTRAST:  186m ISOVUE-300 IOPAMIDOL (ISOVUE-300) INJECTION 61% COMPARISON:  CT 12/12/2016 FINDINGS: Lower chest: Stable LEFT effusion.  Hepatobiliary: Enhancing lesion in the dome liver measuring 12 mm not changed from prior and favored a benign hemangioma. No discrete nodularity along the surface of the liver. Small amount of fluid surrounds the liver is decreased significantly compared to prior. Gallbladder normal. Common bile duct normal Pancreas: Pancreas is normal. No ductal dilatation. No pancreatic inflammation. Spleen: Normal spleen Adrenals/urinary tract: 9 mm lesion in the cortex of the RIGHT kidney cannot be fully characterize but not changed  from prior. Ureters and bladder normal. Obstructive uropathy Stomach/Bowel: Stomach, small-bowel, and cecum normal. Appendix not identified. Colon and rectosigmoid colon normal. Vascular/Lymphatic: Abdominal aorta is normal caliber with atherosclerotic calcification. There is no retroperitoneal or periportal lymphadenopathy. No pelvic lymphadenopathy. Reproductive: Uterus is enhancing body suggesting leiomyoma. There is a large cystic lesion in the RIGHT adnexa measuring 6.3 by 5.5 cm compared to 8.5 x 6.5 cm. LEFT ovary is small. Other: Interval decrease in intraperitoneal free fluid compared to prior. Versus a omental/peritoneal thickening in the LEFT upper quadrant measuring 33 x 14 mm (image 34, series 2). Finding is more prominent now than there is reduction in the adjacent peritoneal fluid. No nodularity in the posterior cul-de-sac. Musculoskeletal: No aggressive osseous lesion. IMPRESSION: 1. Persistent mild ventral peritoneal / omental nodularity in the LEFT abdomen concerning for peritoneal carcinomatosis. 2. Interval reduction in free fluid the abdomen pelvis. 3. Slight decrease in volume of large RIGHT adnexal cystic mass. 4. Stable LEFT pleural effusion. Electronically Signed   By: Suzy Bouchard M.D.   On: 03/05/2017 15:18    ASSESSMENT & PLAN:  Ovarian cancer on right [facility]) She tolerated last cycle of treatment very well Tumor markers are improving and CT scan showed improved  disease control We will proceed with one more treatment today and surgery is planned for next month I will see her back after surgery is completed for possible more treatment down the road  Chronic constipation We discussed the importance of aggressive laxative therapy.  Anemia due to antineoplastic chemotherapy This is likely due to recent treatment. The patient denies recent history of bleeding such as epistaxis, hematuria or hematochezia. She is asymptomatic from the anemia. I will observe for now.    No orders of the defined types were placed in this encounter.  All questions were answered. The patient knows to call the clinic with any problems, questions or concerns. No barriers to learning was detected. I spent 15 minutes counseling the patient face to face. The total time spent in the appointment was 20 minutes and more than 50% was on counseling and review of test results     Heath Lark, MD 03/27/2017 1:32 PM

## 2017-03-27 NOTE — Patient Instructions (Signed)
[facility] Discharge Instructions for Patients Receiving Chemotherapy  Today you received the following chemotherapy agents Taxol and Carboplatin   To help prevent nausea and vomiting after your treatment, we encourage you to take your nausea medication as directed. No Zofran for 3 days, take Compazine instead.    If you develop nausea and vomiting that is not controlled by your nausea medication, call the clinic.   BELOW ARE SYMPTOMS THAT SHOULD BE REPORTED IMMEDIATELY:  *FEVER GREATER THAN 100.5 F  *CHILLS WITH OR WITHOUT FEVER  NAUSEA AND VOMITING THAT IS NOT CONTROLLED WITH YOUR NAUSEA MEDICATION  *UNUSUAL SHORTNESS OF BREATH  *UNUSUAL BRUISING OR BLEEDING  TENDERNESS IN MOUTH AND THROAT WITH OR WITHOUT PRESENCE OF ULCERS  *URINARY PROBLEMS  *BOWEL PROBLEMS  UNUSUAL RASH Items with * indicate a potential emergency and should be followed up as soon as possible.  Feel free to call the clinic should you have any questions or concerns. The clinic phone number is [phone].  Please show the CHEMO ALERT CARD at check-in to the Emergency Department and triage nurse.   

## 2017-03-27 NOTE — Assessment & Plan Note (Signed)
She tolerated last cycle of treatment very well Tumor markers are improving and CT scan showed improved disease control We will proceed with one more treatment today and surgery is planned for next month I will see her back after surgery is completed for possible more treatment down the road

## 2017-03-28 LAB — CA 125: Cancer Antigen (CA) 125: 96.5 U/mL — ABNORMAL HIGH (ref 0.0–38.1)

## 2017-04-11 NOTE — Patient Instructions (Addendum)
SHANAN FITZPATRICK  04/11/2017   Your procedure is scheduled on: 04-22-17  Report to [facility] Main  Entrance Take [place]  elevators to 3rd floor to  [place] at      [phone].  Follow signs to Short Stay on first floor at AM  Call this number if you have problems the morning of surgery [phone]    Remember: ONLY 1 PERSON MAY GO WITH YOU TO SHORT STAY TO GET  [place].             EAT A LIGHT DIET THE DAY BEFORE SURGERY EXAMPLES ARE TOAST, SOUPS, BROTHS, YOGURT, MASHED POTATOES, AVOID: CARBONATED BEVERAGES AND RAW FRUIT AND VEGGIES AT 4PM BEGIN DRINKING 2 BOTTLES OF MAGNESIUM CITRATE AND START A CLEAR LIQUID DIET   Do not eat food or drink liquids :After Midnight.     Take these medicines the morning of surgery with A SIP OF WATER: NONE                                You may not have any metal on your body including hair pins and              piercings  Do not wear jewelry, make-up, lotions, powders or perfumes, deodorant             Do not wear nail polish.  Do not shave  48 hours prior to surgery.     Do not bring valuables to the hospital. [place].  Contacts, dentures or bridgework may not be worn into surgery.  Leave suitcase in the car. After surgery it may be brought to your room.                 Please read over the following fact sheets you were given: _____________________________________________________________________           [facility] - Preparing for Surgery Before surgery, you can play an important role.  Because skin is not sterile, your skin needs to be as free of germs as possible.  You can reduce the number of germs on your skin by washing with CHG (chlorahexidine gluconate) soap before surgery.  CHG is an antiseptic cleaner which kills germs and bonds with the skin to continue killing germs even after washing. Please DO NOT use if you have an allergy to CHG  or antibacterial soaps.  If your skin becomes reddened/irritated stop using the CHG and inform your nurse when you arrive at Short Stay. Do not shave (including legs and underarms) for at least 48 hours prior to the first CHG shower.  You may shave your face/neck. Please follow these instructions carefully:  1.  Shower with CHG Soap the night before surgery and the  morning of Surgery.  2.  If you choose to wash your hair, wash your hair first as usual with your  normal  shampoo.  3.  After you shampoo, rinse your hair and body thoroughly to remove the  shampoo.                           4.  Use CHG as you would any other liquid soap.  You can apply chg directly  to the skin and wash                       Gently with a scrungie or clean washcloth.  5.  Apply the CHG Soap to your body ONLY FROM THE NECK DOWN.   Do not use on face/ open                           Wound or open sores. Avoid contact with eyes, ears mouth and genitals (private parts).                       Wash face,  Genitals (private parts) with your normal soap.             6.  Wash thoroughly, paying special attention to the area where your surgery  will be performed.  7.  Thoroughly rinse your body with warm water from the neck down.  8.  DO NOT shower/wash with your normal soap after using and rinsing off  the CHG Soap.                9.  Pat yourself dry with a clean towel.            10.  Wear clean pajamas.            11.  Place clean sheets on your bed the night of your first shower and do not  sleep with pets. Day of Surgery : Do not apply any lotions/deodorants the morning of surgery.  Please wear clean clothes to the hospital/surgery center.  FAILURE TO FOLLOW THESE INSTRUCTIONS MAY RESULT IN THE CANCELLATION OF YOUR SURGERY PATIENT SIGNATURE_________________________________  NURSE SIGNATURE__________________________________  ________________________________________________________________________    CLEAR LIQUID  DIET   Foods Allowed                                                                     Foods Excluded  Coffee and tea, regular and decaf                             liquids that you cannot  Plain Jell-O in any flavor                                             see through such as: Fruit ices (not with fruit pulp)                                     milk, soups, orange juice  Iced Popsicles                                    All solid food Carbonated beverages, regular and diet  Cranberry, grape and apple juices Sports drinks like Gatorade Lightly seasoned clear broth or consume(fat free) Sugar, honey syrup  Sample Menu Breakfast                                Lunch                                     Supper Cranberry juice                    Beef broth                            Chicken broth Jell-O                                     Grape juice                           Apple juice Coffee or tea                        Jell-O                                      Popsicle                                                Coffee or tea                        Coffee or tea  _____________________________________________________________________    Incentive Spirometer  An incentive spirometer is a tool that can help keep your lungs clear and active. This tool measures how well you are filling your lungs with each breath. Taking long deep breaths may help reverse or decrease the chance of developing breathing (pulmonary) problems (especially infection) following:  A long period of time when you are unable to move or be active. BEFORE THE PROCEDURE   If the spirometer includes an indicator to show your best effort, your nurse or respiratory therapist will set it to a desired goal.  If possible, sit up straight or lean slightly forward. Try not to slouch.  Hold the incentive spirometer in an upright position. INSTRUCTIONS FOR USE  1. Sit on the edge of  your bed if possible, or sit up as far as you can in bed or on a chair. 2. Hold the incentive spirometer in an upright position. 3. Breathe out normally. 4. Place the mouthpiece in your mouth and seal your lips tightly around it. 5. Breathe in slowly and as deeply as possible, raising the piston or the ball toward the top of the column. 6. Hold your breath for 3-5 seconds or for as long as possible. Allow the piston or ball to fall to the bottom of the column. 7. Remove the mouthpiece from your mouth and breathe out normally. 8. Rest for a few seconds and repeat Steps 1 through 7 at  least 10 times every 1-2 hours when you are awake. Take your time and take a few normal breaths between deep breaths. 9. The spirometer may include an indicator to show your best effort. Use the indicator as a goal to work toward during each repetition. 10. After each set of 10 deep breaths, practice coughing to be sure your lungs are clear. If you have an incision (the cut made at the time of surgery), support your incision when coughing by placing a pillow or rolled up towels firmly against it. Once you are able to get out of bed, walk around indoors and cough well. You may stop using the incentive spirometer when instructed by your caregiver.  RISKS AND COMPLICATIONS  Take your time so you do not get dizzy or light-headed.  If you are in pain, you may need to take or ask for pain medication before doing incentive spirometry. It is harder to take a deep breath if you are having pain. AFTER USE  Rest and breathe slowly and easily.  It can be helpful to keep track of a log of your progress. Your caregiver can provide you with a simple table to help with this. If you are using the spirometer at home, follow these instructions: [place] IF:   You are having difficultly using the spirometer.  You have trouble using the spirometer as often as instructed.  Your pain medication is not giving enough relief  while using the spirometer.  You develop fever of 100.5 F (38.1 C) or higher. SEEK IMMEDIATE MEDICAL CARE IF:   You cough up bloody sputum that had not been present before.  You develop fever of 102 F (38.9 C) or greater.  You develop worsening pain at or near the incision site. MAKE SURE YOU:   Understand these instructions.  Will watch your condition.  Will get help right away if you are not doing well or get worse. Document Released: 10/07/2006 Document Revised: 08/19/2011 Document Reviewed: 12/08/2006 Exit[employer] Patient Information 2014 Exit[employer], [place].   ________________________________________________________________________  WHAT IS A BLOOD TRANSFUSION? Blood Transfusion Information  A transfusion is the replacement of blood or some of its parts. Blood is made up of multiple cells which provide different functions.  Red blood cells carry oxygen and are used for blood loss replacement.  White blood cells fight against infection.  Platelets control bleeding.  Plasma helps clot blood.  Other blood products are available for specialized needs, such as hemophilia or other clotting disorders. BEFORE THE TRANSFUSION  Who gives blood for transfusions?   Healthy volunteers who are fully evaluated to make sure their blood is safe. This is blood bank blood. Transfusion therapy is the safest it has ever been in the practice of medicine. Before blood is taken from a donor, a complete history is taken to make sure that person has no history of diseases nor engages in risky social behavior (examples are intravenous drug use or sexual activity with multiple partners). The donor's travel history is screened to minimize risk of transmitting infections, such as malaria. The donated blood is tested for signs of infectious diseases, such as HIV and hepatitis. The blood is then tested to be sure it is compatible with you in order to minimize the chance of a transfusion reaction. If you or  a relative donates blood, this is often done in anticipation of surgery and is not appropriate for emergency situations. It takes many days to process the donated blood. RISKS AND COMPLICATIONS Although transfusion  therapy is very safe and saves many lives, the main dangers of transfusion include:   Getting an infectious disease.  Developing a transfusion reaction. This is an allergic reaction to something in the blood you were given. Every precaution is taken to prevent this. The decision to have a blood transfusion has been considered carefully by your caregiver before blood is given. Blood is not given unless the benefits outweigh the risks. AFTER THE TRANSFUSION  Right after receiving a blood transfusion, you will usually feel much better and more energetic. This is especially true if your red blood cells have gotten low (anemic). The transfusion raises the level of the red blood cells which carry oxygen, and this usually causes an energy increase.  The nurse administering the transfusion will monitor you carefully for complications. HOME CARE INSTRUCTIONS  No special instructions are needed after a transfusion. You may find your energy is better. Speak with your caregiver about any limitations on activity for underlying diseases you may have. SEEK MEDICAL CARE IF:   Your condition is not improving after your transfusion.  You develop redness or irritation at the intravenous (IV) site. SEEK IMMEDIATE MEDICAL CARE IF:  Any of the following symptoms occur over the next 12 hours:  Shaking chills.  You have a temperature by mouth above 102 F (38.9 C), not controlled by medicine.  Chest, back, or muscle pain.  People around you feel you are not acting correctly or are confused.  Shortness of breath or difficulty breathing.  Dizziness and fainting.  You get a rash or develop hives.  You have a decrease in urine output.  Your urine turns a dark color or changes to pink, red, or  brown. Any of the following symptoms occur over the next 10 days:  You have a temperature by mouth above 102 F (38.9 C), not controlled by medicine.  Shortness of breath.  Weakness after normal activity.  The white part of the eye turns yellow (jaundice).  You have a decrease in the amount of urine or are urinating less often.  Your urine turns a dark color or changes to pink, red, or brown. Document Released: 05/24/2000 Document Revised: 08/19/2011 Document Reviewed: 01/11/2008 [facility] Patient Information 2014 [place], [place].  _______________________________________________________________________

## 2017-04-15 ENCOUNTER — Encounter (HOSPITAL_COMMUNITY)
Admission: RE | Admit: 2017-04-15 | Discharge: 2017-04-15 | Disposition: A | Discharge status: Home / Self Care | Payer: Medicare Other | Source: Ambulatory Visit | Attending: Gynecologic Oncology | Admitting: Gynecologic Oncology

## 2017-04-15 ENCOUNTER — Ambulatory Visit (HOSPITAL_COMMUNITY)
Admission: RE | Admit: 2017-04-15 | Discharge: 2017-04-15 | Disposition: A | Discharge status: Home / Self Care | Payer: Medicare Other | Source: Ambulatory Visit | Attending: Gynecologic Oncology | Admitting: Gynecologic Oncology

## 2017-04-15 ENCOUNTER — Encounter (HOSPITAL_COMMUNITY): Payer: Self-pay

## 2017-04-15 ENCOUNTER — Other Ambulatory Visit: Payer: Self-pay

## 2017-04-15 DIAGNOSIS — R001 Bradycardia, unspecified: Secondary | ICD-10-CM | POA: Diagnosis not present

## 2017-04-15 DIAGNOSIS — N83201 Unspecified ovarian cyst, right side: Secondary | ICD-10-CM | POA: Insufficient documentation

## 2017-04-15 DIAGNOSIS — J90 Pleural effusion, not elsewhere classified: Secondary | ICD-10-CM | POA: Diagnosis not present

## 2017-04-15 DIAGNOSIS — D1803 Hemangioma of intra-abdominal structures: Secondary | ICD-10-CM | POA: Diagnosis not present

## 2017-04-15 DIAGNOSIS — K7689 Other specified diseases of liver: Secondary | ICD-10-CM | POA: Insufficient documentation

## 2017-04-15 DIAGNOSIS — Z01810 Encounter for preprocedural cardiovascular examination: Secondary | ICD-10-CM | POA: Diagnosis not present

## 2017-04-15 DIAGNOSIS — C561 Malignant neoplasm of right ovary: Secondary | ICD-10-CM | POA: Diagnosis not present

## 2017-04-15 HISTORY — DX: Other specified postprocedural states: Z98.890

## 2017-04-15 HISTORY — DX: Other complications of anesthesia, initial encounter: T88.59XA

## 2017-04-15 HISTORY — DX: Other specified postprocedural states: R11.2

## 2017-04-15 HISTORY — DX: Adverse effect of unspecified anesthetic, initial encounter: T41.45XA

## 2017-04-15 HISTORY — DX: Heart failure, unspecified: I50.9

## 2017-04-15 HISTORY — DX: Family history of other specified conditions: Z84.89

## 2017-04-15 LAB — CBC
HCT: 33.9 % — ABNORMAL LOW (ref 36.0–46.0)
Hemoglobin: 11.4 g/dL — ABNORMAL LOW (ref 12.0–15.0)
MCH: 33.9 pg (ref 26.0–34.0)
MCHC: 33.6 g/dL (ref 30.0–36.0)
MCV: 100.9 fL — ABNORMAL HIGH (ref 78.0–100.0)
PLATELETS: 161 10*3/uL (ref 150–400)
RBC: 3.36 MIL/uL — AB (ref 3.87–5.11)
RDW: 14.9 % (ref 11.5–15.5)
WBC: 7.2 10*3/uL (ref 4.0–10.5)

## 2017-04-15 LAB — URINALYSIS, ROUTINE W REFLEX MICROSCOPIC
BILIRUBIN URINE: NEGATIVE
Glucose, UA: NEGATIVE mg/dL
Hgb urine dipstick: NEGATIVE
Ketones, ur: NEGATIVE mg/dL
Leukocytes, UA: NEGATIVE
NITRITE: NEGATIVE
PROTEIN: NEGATIVE mg/dL
SPECIFIC GRAVITY, URINE: 1.005 (ref 1.005–1.030)
pH: 7 (ref 5.0–8.0)

## 2017-04-15 LAB — COMPREHENSIVE METABOLIC PANEL
ALBUMIN: 4 g/dL (ref 3.5–5.0)
ALT: 18 U/L (ref 14–54)
AST: 20 U/L (ref 15–41)
Alkaline Phosphatase: 81 U/L (ref 38–126)
Anion gap: 10 (ref 5–15)
BUN: 17 mg/dL (ref 6–20)
CHLORIDE: 101 mmol/L (ref 101–111)
CO2: 27 mmol/L (ref 22–32)
CREATININE: 0.85 mg/dL (ref 0.44–1.00)
Calcium: 10.4 mg/dL — ABNORMAL HIGH (ref 8.9–10.3)
GFR calc Af Amer: >60 mL/min (ref >60)
GFR calc non Af Amer: >60 mL/min (ref >60)
Glucose, Bld: 101 mg/dL — ABNORMAL HIGH (ref 65–99)
POTASSIUM: 4.1 mmol/L (ref 3.5–5.1)
SODIUM: 138 mmol/L (ref 135–145)
Total Bilirubin: 0.7 mg/dL (ref 0.3–1.2)
Total Protein: 6.8 g/dL (ref 6.5–8.1)

## 2017-04-15 LAB — ABO/RH: ABO/RH(D): A POS

## 2017-04-15 MED ORDER — IOPAMIDOL (ISOVUE-300) INJECTION 61%
INTRAVENOUS | Status: AC
  Administered 2017-04-15: 100 mL via INTRAVENOUS
  Filled 2017-04-15: qty 100

## 2017-04-15 MED ORDER — IOPAMIDOL (ISOVUE-300) INJECTION 61%
100.0000 mL | Freq: Once | INTRAVENOUS | Status: AC | PRN
  Administered 2017-04-15: 100 mL via INTRAVENOUS

## 2017-04-15 NOTE — Progress Notes (Signed)
03/2017 labs cbc/diff,cmp epic ekg 01/07/17 chart repeated 04/15/17 epic Ct abd pelvis 03/05/17 epic Echo 12/08/16 epic  01/08/17 cxr epic

## 2017-04-18 ENCOUNTER — Telehealth: Payer: Self-pay | Admitting: Gynecologic Oncology

## 2017-04-18 NOTE — Telephone Encounter (Signed)
Left message for daughter asking her to please call the office to discuss CT scan results.

## 2017-04-22 ENCOUNTER — Inpatient Hospital Stay (HOSPITAL_COMMUNITY): Payer: Medicare Other | Admitting: Certified Registered Nurse Anesthetist

## 2017-04-22 ENCOUNTER — Other Ambulatory Visit: Payer: Self-pay

## 2017-04-22 ENCOUNTER — Encounter (HOSPITAL_COMMUNITY): Payer: Self-pay | Admitting: Emergency Medicine

## 2017-04-22 ENCOUNTER — Inpatient Hospital Stay (HOSPITAL_COMMUNITY)
Admission: RE | Admit: 2017-04-22 | Discharge: 2017-04-24 | DRG: 737 | Disposition: A | Discharge status: Home / Self Care | Payer: Medicare Other | Source: Ambulatory Visit | Attending: Gynecologic Oncology | Admitting: Gynecologic Oncology

## 2017-04-22 ENCOUNTER — Telehealth: Payer: Self-pay | Admitting: *Deleted

## 2017-04-22 ENCOUNTER — Encounter (HOSPITAL_COMMUNITY)
Admission: RE | Disposition: A | Discharge status: Home / Self Care | Payer: Self-pay | Source: Ambulatory Visit | Attending: Gynecologic Oncology

## 2017-04-22 DIAGNOSIS — Z87892 Personal history of anaphylaxis: Secondary | ICD-10-CM | POA: Diagnosis not present

## 2017-04-22 DIAGNOSIS — Z79899 Other long term (current) drug therapy: Secondary | ICD-10-CM | POA: Diagnosis not present

## 2017-04-22 DIAGNOSIS — C561 Malignant neoplasm of right ovary: Principal | ICD-10-CM | POA: Diagnosis present

## 2017-04-22 DIAGNOSIS — C569 Malignant neoplasm of unspecified ovary: Secondary | ICD-10-CM | POA: Diagnosis present

## 2017-04-22 DIAGNOSIS — T451X5A Adverse effect of antineoplastic and immunosuppressive drugs, initial encounter: Secondary | ICD-10-CM | POA: Diagnosis present

## 2017-04-22 DIAGNOSIS — E785 Hyperlipidemia, unspecified: Secondary | ICD-10-CM | POA: Diagnosis present

## 2017-04-22 DIAGNOSIS — Z885 Allergy status to narcotic agent status: Secondary | ICD-10-CM | POA: Diagnosis not present

## 2017-04-22 DIAGNOSIS — Z803 Family history of malignant neoplasm of breast: Secondary | ICD-10-CM

## 2017-04-22 DIAGNOSIS — D62 Acute posthemorrhagic anemia: Secondary | ICD-10-CM | POA: Diagnosis not present

## 2017-04-22 DIAGNOSIS — Z9221 Personal history of antineoplastic chemotherapy: Secondary | ICD-10-CM | POA: Diagnosis not present

## 2017-04-22 DIAGNOSIS — Z8249 Family history of ischemic heart disease and other diseases of the circulatory system: Secondary | ICD-10-CM | POA: Diagnosis not present

## 2017-04-22 DIAGNOSIS — Z7952 Long term (current) use of systemic steroids: Secondary | ICD-10-CM | POA: Diagnosis not present

## 2017-04-22 DIAGNOSIS — Z887 Allergy status to serum and vaccine status: Secondary | ICD-10-CM

## 2017-04-22 DIAGNOSIS — I10 Essential (primary) hypertension: Secondary | ICD-10-CM | POA: Diagnosis present

## 2017-04-22 DIAGNOSIS — Z7982 Long term (current) use of aspirin: Secondary | ICD-10-CM

## 2017-04-22 DIAGNOSIS — F0390 Unspecified dementia without behavioral disturbance: Secondary | ICD-10-CM | POA: Diagnosis present

## 2017-04-22 DIAGNOSIS — R188 Other ascites: Secondary | ICD-10-CM | POA: Diagnosis present

## 2017-04-22 DIAGNOSIS — D6481 Anemia due to antineoplastic chemotherapy: Secondary | ICD-10-CM | POA: Diagnosis present

## 2017-04-22 HISTORY — PX: DEBULKING: SHX6277

## 2017-04-22 HISTORY — PX: OMENTECTOMY: SHX5985

## 2017-04-22 HISTORY — PX: LAPAROTOMY: SHX154

## 2017-04-22 HISTORY — PX: ROBOTIC ASSISTED TOTAL HYSTERECTOMY WITH BILATERAL SALPINGO OOPHERECTOMY: SHX6086

## 2017-04-22 LAB — TYPE AND SCREEN
ABO/RH(D): A POS
Antibody Screen: NEGATIVE

## 2017-04-22 SURGERY — HYSTERECTOMY, TOTAL, ROBOT-ASSISTED, LAPAROSCOPIC, WITH BILATERAL SALPINGO-OOPHORECTOMY
Anesthesia: General

## 2017-04-22 MED ORDER — ASPIRIN EC 81 MG PO TBEC
81.0000 mg | DELAYED_RELEASE_TABLET | Freq: Every day | ORAL | Status: DC
  Administered 2017-04-23: 81 mg via ORAL
  Filled 2017-04-22: qty 1

## 2017-04-22 MED ORDER — SODIUM CHLORIDE 0.9 % IJ SOLN
INTRAMUSCULAR | Status: AC
  Filled 2017-04-22: qty 10

## 2017-04-22 MED ORDER — PHENYLEPHRINE HCL 10 MG/ML IJ SOLN
INTRAMUSCULAR | Status: DC | PRN
  Administered 2017-04-22: 80 ug via INTRAVENOUS
  Administered 2017-04-22 (×2): 40 ug via INTRAVENOUS

## 2017-04-22 MED ORDER — SUGAMMADEX SODIUM 200 MG/2ML IV SOLN
INTRAVENOUS | Status: AC
  Filled 2017-04-22: qty 2

## 2017-04-22 MED ORDER — PROMETHAZINE HCL 25 MG/ML IJ SOLN
6.2500 mg | Freq: Once | INTRAMUSCULAR | Status: AC
  Administered 2017-04-22: 6.25 mg via INTRAVENOUS

## 2017-04-22 MED ORDER — LACTATED RINGERS IV SOLN
INTRAVENOUS | Status: DC
  Administered 2017-04-22 (×3): via INTRAVENOUS

## 2017-04-22 MED ORDER — PROMETHAZINE HCL 25 MG/ML IJ SOLN
INTRAMUSCULAR | Status: AC
  Filled 2017-04-22: qty 1

## 2017-04-22 MED ORDER — PROPOFOL 10 MG/ML IV BOLUS
INTRAVENOUS | Status: AC
  Filled 2017-04-22: qty 20

## 2017-04-22 MED ORDER — ONDANSETRON HCL 4 MG/2ML IJ SOLN
INTRAMUSCULAR | Status: AC
  Filled 2017-04-22: qty 2

## 2017-04-22 MED ORDER — TRAMADOL HCL 50 MG PO TABS
100.0000 mg | ORAL_TABLET | Freq: Two times a day (BID) | ORAL | Status: DC | PRN
  Administered 2017-04-22: 100 mg via ORAL
  Filled 2017-04-22: qty 2

## 2017-04-22 MED ORDER — PROPOFOL 10 MG/ML IV BOLUS
INTRAVENOUS | Status: DC | PRN
Start: 2017-04-22 — End: 2017-04-22
  Administered 2017-04-22: 80 mg via INTRAVENOUS

## 2017-04-22 MED ORDER — HYDROMORPHONE HCL 2 MG PO TABS: 2.0000 mg | ORAL_TABLET | ORAL | Status: DC | PRN

## 2017-04-22 MED ORDER — 0.9 % SODIUM CHLORIDE (POUR BTL) OPTIME
TOPICAL | Status: DC | PRN
  Administered 2017-04-22: 1000 mL

## 2017-04-22 MED ORDER — ENOXAPARIN SODIUM 40 MG/0.4ML ~~LOC~~ SOLN
40.0000 mg | SUBCUTANEOUS | Status: AC
  Administered 2017-04-22: 40 mg via SUBCUTANEOUS
  Filled 2017-04-22: qty 0.4

## 2017-04-22 MED ORDER — LIDOCAINE 2% (20 MG/ML) 5 ML SYRINGE
INTRAMUSCULAR | Status: DC | PRN
  Administered 2017-04-22: 40 mg via INTRAVENOUS

## 2017-04-22 MED ORDER — HYDROMORPHONE HCL 1 MG/ML IJ SOLN
0.5000 mg | INTRAMUSCULAR | Status: DC | PRN
  Administered 2017-04-22: 0.5 mg via INTRAVENOUS
  Filled 2017-04-22: qty 0.5

## 2017-04-22 MED ORDER — SIMVASTATIN 20 MG PO TABS
20.0000 mg | ORAL_TABLET | Freq: Every day | ORAL | Status: DC
  Administered 2017-04-22 – 2017-04-23 (×2): 20 mg via ORAL
  Filled 2017-04-22 (×2): qty 1

## 2017-04-22 MED ORDER — SENNOSIDES-DOCUSATE SODIUM 8.6-50 MG PO TABS
2.0000 | ORAL_TABLET | Freq: Every day | ORAL | Status: DC
  Administered 2017-04-22 – 2017-04-23 (×2): 2 via ORAL
  Filled 2017-04-22 (×2): qty 2

## 2017-04-22 MED ORDER — ROCURONIUM BROMIDE 50 MG/5ML IV SOSY
PREFILLED_SYRINGE | INTRAVENOUS | Status: DC | PRN
  Administered 2017-04-22: 40 mg via INTRAVENOUS
  Administered 2017-04-22 (×3): 10 mg via INTRAVENOUS

## 2017-04-22 MED ORDER — ONDANSETRON HCL 4 MG/2ML IJ SOLN
INTRAMUSCULAR | Status: DC | PRN
  Administered 2017-04-22 (×2): 4 mg via INTRAVENOUS

## 2017-04-22 MED ORDER — SODIUM CHLORIDE 0.9 % IJ SOLN
INTRAMUSCULAR | Status: DC | PRN
Start: 2017-04-22 — End: 2017-04-22
  Administered 2017-04-22: 20 mL

## 2017-04-22 MED ORDER — FENTANYL CITRATE (PF) 250 MCG/5ML IJ SOLN
INTRAMUSCULAR | Status: AC
  Filled 2017-04-22: qty 5

## 2017-04-22 MED ORDER — FENTANYL CITRATE (PF) 100 MCG/2ML IJ SOLN
25.0000 ug | INTRAMUSCULAR | Status: DC | PRN
  Administered 2017-04-22 (×2): 25 ug via INTRAVENOUS

## 2017-04-22 MED ORDER — IBUPROFEN 800 MG PO TABS
800.0000 mg | ORAL_TABLET | Freq: Four times a day (QID) | ORAL | Status: DC
  Administered 2017-04-23 – 2017-04-24 (×4): 800 mg via ORAL
  Filled 2017-04-22 (×4): qty 1

## 2017-04-22 MED ORDER — SUGAMMADEX SODIUM 200 MG/2ML IV SOLN
INTRAVENOUS | Status: DC | PRN
  Administered 2017-04-22: 200 mg via INTRAVENOUS

## 2017-04-22 MED ORDER — BUPIVACAINE-EPINEPHRINE (PF) 0.25% -1:200000 IJ SOLN
INTRAMUSCULAR | Status: AC
  Filled 2017-04-22: qty 30

## 2017-04-22 MED ORDER — FENTANYL CITRATE (PF) 100 MCG/2ML IJ SOLN
INTRAMUSCULAR | Status: AC
  Filled 2017-04-22: qty 2

## 2017-04-22 MED ORDER — ACETAMINOPHEN 500 MG PO TABS
1000.0000 mg | ORAL_TABLET | Freq: Two times a day (BID) | ORAL | Status: DC
  Administered 2017-04-22 – 2017-04-24 (×4): 1000 mg via ORAL
  Filled 2017-04-22 (×4): qty 2

## 2017-04-22 MED ORDER — SODIUM CHLORIDE 0.9 % IJ SOLN
INTRAMUSCULAR | Status: AC
  Filled 2017-04-22: qty 20

## 2017-04-22 MED ORDER — KCL IN DEXTROSE-NACL 20-5-0.45 MEQ/L-%-% IV SOLN
INTRAVENOUS | Status: DC
  Administered 2017-04-22 (×2): via INTRAVENOUS
  Filled 2017-04-22 (×3): qty 1000

## 2017-04-22 MED ORDER — BUPIVACAINE HCL (PF) 0.25 % IJ SOLN
INTRAMUSCULAR | Status: AC
  Filled 2017-04-22: qty 30

## 2017-04-22 MED ORDER — ROCURONIUM BROMIDE 50 MG/5ML IV SOSY
PREFILLED_SYRINGE | INTRAVENOUS | Status: AC
  Filled 2017-04-22: qty 5

## 2017-04-22 MED ORDER — ONDANSETRON HCL 4 MG PO TABS: 4.0000 mg | ORAL_TABLET | Freq: Four times a day (QID) | ORAL | Status: DC | PRN

## 2017-04-22 MED ORDER — ENOXAPARIN SODIUM 40 MG/0.4ML ~~LOC~~ SOLN
40.0000 mg | SUBCUTANEOUS | Status: DC
  Administered 2017-04-23 – 2017-04-24 (×2): 40 mg via SUBCUTANEOUS
  Filled 2017-04-22 (×3): qty 0.4

## 2017-04-22 MED ORDER — DEXAMETHASONE SODIUM PHOSPHATE 10 MG/ML IJ SOLN
INTRAMUSCULAR | Status: DC | PRN
  Administered 2017-04-22: 10 mg via INTRAVENOUS

## 2017-04-22 MED ORDER — LIDOCAINE 2% (20 MG/ML) 5 ML SYRINGE
INTRAMUSCULAR | Status: AC
  Filled 2017-04-22: qty 5

## 2017-04-22 MED ORDER — RINGERS IRRIGATION IR SOLN
Status: DC | PRN
  Administered 2017-04-22: 1

## 2017-04-22 MED ORDER — DEXAMETHASONE SODIUM PHOSPHATE 10 MG/ML IJ SOLN
INTRAMUSCULAR | Status: AC
  Filled 2017-04-22: qty 1

## 2017-04-22 MED ORDER — ONDANSETRON HCL 4 MG/2ML IJ SOLN
4.0000 mg | Freq: Four times a day (QID) | INTRAMUSCULAR | Status: DC | PRN
  Administered 2017-04-22 (×2): 4 mg via INTRAVENOUS
  Filled 2017-04-22 (×2): qty 2

## 2017-04-22 MED ORDER — DEXTROSE 5 % IV SOLN
2.0000 g | INTRAVENOUS | Status: AC
  Administered 2017-04-22: 2 g via INTRAVENOUS
  Filled 2017-04-22: qty 2

## 2017-04-22 MED ORDER — BUPIVACAINE LIPOSOME 1.3 % IJ SUSP
20.0000 mL | Freq: Once | INTRAMUSCULAR | Status: AC
Start: 2017-04-22 — End: 2017-04-22
  Administered 2017-04-22: 20 mL
  Filled 2017-04-22: qty 20

## 2017-04-22 MED ORDER — BUPIVACAINE HCL (PF) 0.25 % IJ SOLN
INTRAMUSCULAR | Status: DC | PRN
  Administered 2017-04-22: 20 mL

## 2017-04-22 MED ORDER — FENTANYL CITRATE (PF) 100 MCG/2ML IJ SOLN
INTRAMUSCULAR | Status: DC | PRN
  Administered 2017-04-22 (×6): 25 ug via INTRAVENOUS

## 2017-04-22 SURGICAL SUPPLY — 65 items
ADH SKN CLS APL DERMABOND .7 (GAUZE/BANDAGES/DRESSINGS) ×2
AGENT HMST KT MTR STRL THRMB (HEMOSTASIS)
APL ESCP 34 STRL LF DISP (HEMOSTASIS)
APPLICATOR SURGIFLO ENDO (HEMOSTASIS) IMPLANT
BAG LAPAROSCOPIC 12 15 PORT 16 (BASKET) ×2 IMPLANT
BAG RETRIEVAL 12/15 (BASKET) ×3
BAG SPEC RTRVL LRG 6X4 10 (ENDOMECHANICALS)
CELLS DAT CNTRL 66122 CELL SVR (MISCELLANEOUS) ×2 IMPLANT
CLIP TI MEDIUM 6 (CLIP) ×6 IMPLANT
COVER BACK TABLE 60X90IN (DRAPES) ×3 IMPLANT
COVER TIP SHEARS 8 DVNC (MISCELLANEOUS) ×2 IMPLANT
COVER TIP SHEARS 8MM DA VINCI (MISCELLANEOUS) ×1
DERMABOND ADVANCED (GAUZE/BANDAGES/DRESSINGS) ×1
DERMABOND ADVANCED .7 DNX12 (GAUZE/BANDAGES/DRESSINGS) ×2 IMPLANT
DRAPE ARM DVNC X/XI (DISPOSABLE) ×8 IMPLANT
DRAPE COLUMN DVNC XI (DISPOSABLE) ×2 IMPLANT
DRAPE DA VINCI XI ARM (DISPOSABLE) ×4
DRAPE DA VINCI XI COLUMN (DISPOSABLE) ×1
DRAPE SHEET LG 3/4 BI-LAMINATE (DRAPES) ×3 IMPLANT
DRAPE SURG IRRIG POUCH 19X23 (DRAPES) ×3 IMPLANT
DRSG OPSITE POSTOP 4X6 (GAUZE/BANDAGES/DRESSINGS) ×3 IMPLANT
ELECT BLADE TIP CTD 4 INCH (ELECTRODE) ×3 IMPLANT
ELECT PENCIL ROCKER SW 15FT (MISCELLANEOUS) ×3 IMPLANT
ELECT REM PT RETURN 15FT ADLT (MISCELLANEOUS) ×3 IMPLANT
GAUZE SPONGE 4X4 16PLY XRAY LF (GAUZE/BANDAGES/DRESSINGS) ×3 IMPLANT
GLOVE BIO SURGEON STRL SZ 6 (GLOVE) ×12 IMPLANT
GLOVE BIO SURGEON STRL SZ 6.5 (GLOVE) ×6 IMPLANT
GOWN STRL REUS W/ TWL LRG LVL3 (GOWN DISPOSABLE) ×4 IMPLANT
GOWN STRL REUS W/TWL LRG LVL3 (GOWN DISPOSABLE) ×6
HOLDER FOLEY CATH W/STRAP (MISCELLANEOUS) ×3 IMPLANT
IRRIG SUCT STRYKERFLOW 2 WTIP (MISCELLANEOUS) ×3
IRRIGATION SUCT STRKRFLW 2 WTP (MISCELLANEOUS) ×2 IMPLANT
KIT PROCEDURE DA VINCI SI (MISCELLANEOUS)
KIT PROCEDURE DVNC SI (MISCELLANEOUS) IMPLANT
LIGASURE IMPACT 36 18CM CVD LR (INSTRUMENTS) ×3 IMPLANT
MANIPULATOR UTERINE 4.5 ZUMI (MISCELLANEOUS) ×3 IMPLANT
NDL SAFETY ECLIPSE 18X1.5 (NEEDLE) ×2 IMPLANT
NEEDLE HYPO 18GX1.5 SHARP (NEEDLE) ×3
NEEDLE SPNL 18GX3.5 QUINCKE PK (NEEDLE) ×3 IMPLANT
OBTURATOR OPTICAL STANDARD 8MM (TROCAR) ×1
OBTURATOR OPTICAL STND 8 DVNC (TROCAR) ×2
OBTURATOR OPTICALSTD 8 DVNC (TROCAR) ×2 IMPLANT
PACK ROBOT GYN CUSTOM WL (TRAY / TRAY PROCEDURE) ×3 IMPLANT
PAD POSITIONING PINK XL (MISCELLANEOUS) ×3 IMPLANT
POUCH SPECIMEN RETRIEVAL 10MM (ENDOMECHANICALS) IMPLANT
RTRCTR WOUND ALEXIS 18CM MED (MISCELLANEOUS) ×3
SEAL CANN UNIV 5-8 DVNC XI (MISCELLANEOUS) ×8 IMPLANT
SEAL XI 5MM-8MM UNIVERSAL (MISCELLANEOUS) ×4
SET TRI-LUMEN FLTR TB AIRSEAL (TUBING) ×3 IMPLANT
SOLUTION ELECTROLUBE (MISCELLANEOUS) ×3 IMPLANT
SPONGE LAP 18X18 X RAY DECT (DISPOSABLE) ×6 IMPLANT
SURGIFLO W/THROMBIN 8M KIT (HEMOSTASIS) IMPLANT
SUT PDS AB 1 TP1 96 (SUTURE) ×6 IMPLANT
SUT VIC AB 0 CT1 27 (SUTURE)
SUT VIC AB 0 CT1 27XBRD ANTBC (SUTURE) IMPLANT
SUT VIC AB 3-0 SH 27 (SUTURE) ×3
SUT VIC AB 3-0 SH 27XBRD (SUTURE) ×2 IMPLANT
SYR 10ML LL (SYRINGE) ×3 IMPLANT
SYR BULB IRRIGATION 50ML (SYRINGE) ×3 IMPLANT
TOWEL OR NON WOVEN STRL DISP B (DISPOSABLE) ×3 IMPLANT
TRAP SPECIMEN MUCOUS 40CC (MISCELLANEOUS) IMPLANT
TRAY FOLEY W/METER SILVER 16FR (SET/KITS/TRAYS/PACK) ×3 IMPLANT
UNDERPAD 30X30 (UNDERPADS AND DIAPERS) ×3 IMPLANT
WATER STERILE IRR 1000ML POUR (IV SOLUTION) ×6 IMPLANT
YANKAUER SUCT BULB TIP 10FT TU (MISCELLANEOUS) ×3 IMPLANT

## 2017-04-22 NOTE — Anesthesia Preprocedure Evaluation (Addendum)
Anesthesia Evaluation  Patient identified by MRN, date of birth, ID band Patient awake    Reviewed: Allergy & Precautions, NPO status , Patient's Chart, lab work & pertinent test results  History of Anesthesia Complications (+) PONV  Airway Mallampati: II  TM Distance: >3 FB Neck ROM: Full    Dental  (+) Edentulous Upper, Edentulous Lower   Pulmonary  H/o malignant pleural effusion L   breath sounds clear to auscultation       Cardiovascular hypertension, Pt. on medications and Pt. on home beta blockers (-) angina Rhythm:Regular Rate:Normal  7/18 ECHO: EF 65-70%, mild MR   Neuro/Psych PSYCHIATRIC DISORDERS (mildly confused at times in hospital)    GI/Hepatic negative GI ROS, Neg liver ROS,   Endo/Other  negative endocrine ROS  Renal/GU negative Renal ROS   H/o ovarian cancer    Musculoskeletal   Abdominal   Peds  Hematology   Anesthesia Other Findings   Reproductive/Obstetrics                            Anesthesia Physical Anesthesia Plan  ASA: III  Anesthesia Plan: General   Post-op Pain Management:    Induction: Intravenous  PONV Risk Score and Plan: 4 or greater and Treatment may vary due to age or medical condition, Ondansetron and Dexamethasone  Airway Management Planned: Oral ETT  Additional Equipment:   Intra-op Plan:   Post-operative Plan: Extubation in OR  Informed Consent: I have reviewed the patients History and Physical, chart, labs and discussed the procedure including the risks, benefits and alternatives for the proposed anesthesia with the patient or authorized representative who has indicated his/her understanding and acceptance.   Consent reviewed with POA  Plan Discussed with: Surgeon and CRNA  Anesthesia Plan Comments: (Plan routine monitors, GETA  Discussed with pt and her daughter)       Anesthesia Quick Evaluation

## 2017-04-22 NOTE — Op Note (Signed)
OPERATIVE NOTE 04/22/17  Surgeon: Donaciano Eva   Assistants: Dr Lahoma Crocker (an MD assistant was necessary for tissue manipulation, management of robotic instrumentation, retraction and positioning due to the complexity of the case and hospital policies).   Anesthesia: General endotracheal anesthesia  ASA Class: 3   Pre-operative Diagnosis: stage IV ovarian cancer, s/p neoadjuvant chemotherapy  Post-operative Diagnosis: same  Operation: Robotic-assisted laparoscopic total hysterectomy with bilateral salpingoophorectomy, ex lap, omentectomy, mobilzation of splenic flexure, radical tumor debulking.  Surgeon: Donaciano Eva  Assistant Surgeon: Lahoma Crocker MD  Anesthesia: GET  Urine Output: 300cc  Operative Findings:  : 7cm right ovarian cyst,  4cm tumor knot in left upper quadrant adherent to the splenic flexure of the colon and stomach.  No gross residual disease at the end of the procedure representing an optimal cytoreduction.  Estimated Blood Loss:  less than 100 mL      Total IV Fluids: 1,000 ml         Specimens: uterus with cervix, tubes and ovaries, omentum         Complications:  None; patient tolerated the procedure well.         Disposition: PACU - hemodynamically stable.  Procedure Details  The patient was seen in the Holding Room. The risks, benefits, complications, treatment options, and expected outcomes were discussed with the patient.  The patient concurred with the proposed plan, giving informed consent.  The site of surgery properly noted/marked. The patient was identified as Denise Bonilla and the procedure verified as a Robotic-assisted hysterectomy with bilateral salpingo oophorectomy and omentectomy. A Time Out was held and the above information confirmed.  After induction of anesthesia, the patient was draped and prepped in the usual sterile manner. Pt was placed in supine position after anesthesia and draped and prepped in the  usual sterile manner. The abdominal drape was placed after the CholoraPrep had been allowed to dry for 3 minutes.  Her arms were tucked to her side with all appropriate precautions.  The shoulders were stabilized with padded shoulder blocks applied to the acromium processes.  The patient was placed in the semi-lithotomy position in [place].  The perineum was prepped with Betadine. The patient was then prepped. Foley catheter was placed.  A sterile speculum was placed in the vagina.  The cervix was grasped with a single-tooth tenaculum and dilated with Kennon Rounds dilators.  The ZUMI uterine manipulator with a medium colpotomizer ring was placed. An incision was made on the posterior perineum to facilitate koh ring placement.  A pneum occluder balloon was placed over the manipulator.  OG tube placement was confirmed and to suction.   Next, a 5 mm skin incision was made 1 cm below the subcostal margin in the midclavicular line.  The 5 mm Optiview port and scope was used for direct entry.  Opening pressure was under 10 mm CO2.  The abdomen was insufflated and the findings were noted as above.   At this point and all points during the procedure, the patient's intra-abdominal pressure did not exceed 15 mmHg. Next, a 10 mm skin incision was made in the umbilicus and a right and left port was placed about 10 cm lateral to the robot port on the right and left side.  A fourth arm was placed in the left lower quadrant 2 cm above and superior and medial to the anterior superior iliac spine.  All ports were placed under direct visualization.  The patient was placed in steep Trendelenburg.  Bowel was folded away into the upper abdomen.  The robot was docked in the normal manner.  The hysterectomy was started after the round ligament on the right side was incised and the retroperitoneum was entered and the pararectal space was developed.  The ureter was noted to be on the medial leaf of the broad ligament.  The peritoneum  above the ureter was incised and stretched and the infundibulopelvic ligament was skeletonized, cauterized and cut.  The posterior peritoneum was taken down to the level of the KOH ring.  The anterior peritoneum was also taken down.  The bladder flap was created to the level of the KOH ring.  The uterine artery on the right side was skeletonized, cauterized and cut in the normal manner.  A similar procedure was performed on the left.  The colpotomy was made and the uterus, cervix, bilateral ovaries and tubes were amputated and delivered through the vagina.  Pedicles were inspected and excellent hemostasis was achieved.    The colpotomy at the vaginal cuff was closed with Vicryl on a CT1 needle in a running manner.  Irrigation was used and excellent hemostasis was achieved.  At this point in the procedure was completed.  Robotic instruments were removed under direct visulaization.  The robot was undocked.  A decision was was made to perform a minilaparotomy in the upper abdomen to facililtate debulking of the left upper quadrant tumor mass. The 10blade scalpel was used to make an incision in the upper abdomen in the midline measuring 10cm. An alexis retractor was placed. The falciform ligament was taken down with the ligasure. The transverse colon was grasped and the bovie was used to open the lesser sac. The gastro-colic omentum was separated from the greater curvature using the ligasure on the short gastric vessels. The tumor mass was carefully dissected off of the transverse and splenic flexure colon and the colon was mobilized from its attachments to the spleen and the sidewall. The omentum with tumor was completely resected using a combination of sharp dissectiond and bipolar cautery. Hemostasis was observed. The bowel wall was carefully inspected for injury and none was noted.  The midline incision was closed with looped # 1 PDS. Exparel was infiltrated into the incision and muscle layers. The 10 mm ports  were closed with Vicryl on a UR-5 needle and the fascia was closed with 0 Vicryl on a UR-5 needle.  The skin was closed with 4-0 Vicryl in a subcuticular manner.  Dermabond was applied.  Sponge, lap and needle counts correct x 2.  The patient was taken to the recovery room in stable condition.  The perineal incision was closed with 3-0 vicryl in a running fashion.  The vagina was swabbed with  minimal bleeding noted.   All instrument and needle counts were correct x  3.   The patient was transferred to the recovery room in stable condition.  Donaciano Eva, MD

## 2017-04-22 NOTE — H&P (Signed)
CC:     Chief Complaint  Patient presents with  . Ovarian cancer on right [facility])    Assessment/Plan:  Denise Bonilla  is a 83 y.o.  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  I feel that it is reasonable to consider an interval debulking surgery. I think she would be a candidate for a minimally invasive approach. We will attempt robotic assisted hysterectomy, BSO, omentectomy with likely needing minilaparotomy for omental debulking. I feel that she is likely to recover well from that surgery and would recommend 2 additional cycles of chemotherapy postop.  I discussed risks of surgery including  bleeding, infection, damage to internal organs (such as bladder,ureters, bowels), blood clot, reoperation and rehospitalization. I discussed that surgical complications can prevent patients from resuming chemotherapy in a timely fashion.   HPI: Denise Bonilla is a 83 year old P3 who is seen in consultation at the request of Dr Karleen Hampshire for presumed stage IV right ovarian cancer.  The patient has been having nausea and anorexia (but without weight loss or GI obstruction symptoms) since June, 2018. Her daughter took her to an ED for this where she was treated for UTI. Chest imaging had revealed a large pleural effusion. She had a small right pleural effusion.   CT chest/abdo/pelvis on 12/12/16 showed a left pleural effusion, tiny right pleural effusion. Nonspecific ill defined low density in the inferior right lobe of the liver. No bowel obstruction. Large right adnexal cystic structure measures 8.5x6.5cm with internal septations. Left ovary not well definied. No gross left adnexal mass. Uterus is abnormal in appearance for age with endometrial thinkening and fluid in the canal. Posterior heterogeneous lesion in the uterus may be fibroid. Small volume of upper abdominal ascites.   Cytology from the thoracentesis on  12/08/16 showed metastatic adenocarcinoma with positive stains for CK7, Pax 8, WT1, negative for CK 20 and ER. Overall favored gyn primary.  CA 125 on 12/13/16 was elevated at 1310 CA 19-9 was elevated at 357.  The patient has had a prior left ovarian cystectomy (possible LSO) remotely in the past for a benign ovarian cyst.  She has a history of dementia but is currently living with her daughter and fairly independent with most activities. She is a fair historian.  Interval Hx:  She went on 2 receive 4 cycles of carb/tax chemotherapy ( 01/01/17 - 03/06/17).  She required admission after cycle 1 for nausea and emesis. She has improved PS after each cycle and is now doing well with chemotherapy.  CA 125 was 128 on 03/06/17 (day 1 of cycle 4).  CT abdo/pelvison 03/05/17 showed a persistent right ovarian cyst measuring 8.5cm with decrease in ascites, and persistent but improved left omental thickening.   She received cycle 5 of carb tax on 03/27/17.  CT abdo/pelvis on 04/15/17 showed a large cyst associated with the RIGHT adnexa is unchanged comparison exam measuring 6.1 by 5.8 cm compared to 6.3 x 6.0 cm. Uterus normal. LEFT adnexa normal/unchanged.No peritoneal nodularity identified. No omental thickening. No free fluid  CA 125 on 03/27/17 was 96.5.  Current Meds:      Outpatient Encounter Prescriptions as of 03/18/2017  Medication Sig  . aspirin EC 81 MG tablet Take 81 mg by mouth daily.  . bisoprolol (ZEBETA) 10 MG tablet Take 10 mg by mouth daily.  . Cholecalciferol (VITAMIN D) 2000 units tablet Take 2,000 Units by mouth daily.  Marland Kitchen  dexamethasone (DECADRON) 4 MG tablet 5 tabs the night before and 5 tabs in the morning of chemo, take with food, every 3 weeks  . furosemide (LASIX) 20 MG tablet Take 1 tablet (20 mg total) by mouth daily.  Marland Kitchen lidocaine-prilocaine (EMLA) cream Apply to affected area once  . Multiple Vitamins-Minerals (MULTI FOR HER 50+) TABS Take 1 tablet by mouth daily.  Marland Kitchen  nystatin (MYCOSTATIN) 100000 UNIT/ML suspension Take 5 mLs (500,000 Units total) by mouth 4 (four) times daily. Stop 48 hours after disappearance of white spots.  . Omega-3 Fatty Acids (FISH OIL) 500 MG CAPS Take 1 capsule by mouth daily.  . ondansetron (ZOFRAN) 8 MG tablet Take three times a day as needed  . prochlorperazine (COMPAZINE) 10 MG tablet Take 1 tablet (10 mg total) by mouth every 6 (six) hours as needed (Nausea or vomiting).  . simvastatin (ZOCOR) 20 MG tablet Take 20 mg by mouth at bedtime.    No facility-administered encounter medications on file as of 03/18/2017.     Allergy:      Allergies  Allergen Reactions  . Tetanus Toxoids Anaphylaxis  . Codeine Nausea And Vomiting    Social Hx:   Social History        Social History  . Marital status: Widowed    Spouse name: N/A  . Number of children: 3  . Years of education: N/A       Occupational History  . retired        Social History Main Topics  . Smoking status: Never Smoker  . Smokeless tobacco: Never Used  . Alcohol use No  . Drug use: No  . Sexual activity: Not on file       Other Topics Concern  . Not on file      Social History Narrative  . No narrative on file    Past Surgical Hx:       Past Surgical History:  Procedure Laterality Date  . CYSTECTOMY    . IR FLUORO GUIDE PORT INSERTION RIGHT  04-09-2017  . IR US GUIDE VASC ACCESS RIGHT  04-09-2017    Past Medical Hx:      Past Medical History:  Diagnosis Date  . Dementia   . Hyperlipidemia   . Hypertension   . Malignant pleural effusion    Left  . Ovarian cancer on right [facility]) 12/23/2016    Past Gynecological History:  SVDx 3 No LMP recorded. Patient is postmenopausal.  Family Hx:       Family History  Problem Relation Age of Onset  . Cancer Other   . Hyperlipidemia Other   . Hypertension Other   . Heart failure Mother   . Breast cancer Sister     Review of Systems:  Constitutional   Feels well,    ENT Normal appearing ears and nares bilaterally Skin/Breast  No rash, sores, jaundice, itching, dryness Cardiovascular  No chest pain, shortness of breath, or edema  Pulmonary  No cough or wheeze.  Gastro Intestinal  No nausea, vomitting, or diarrhoea. No bright red blood per rectum, no abdominal pain, change in bowel movement, or constipation.  Genito Urinary  No frequency, urgency, dysuria, no bleeding Musculo Skeletal  No myalgia, arthralgia, joint swelling or pain  Neurologic  No weakness, numbness, change in gait,  Psychology  No depression, anxiety, insomnia.   Vitals:  Blood pressure 125/67, pulse 70, temperature (!) 97.5 F (36.4 C), temperature source Oral, resp. rate 18, SpO2 100 %.  Physical  Exam: WD in NAD Neck  Supple NROM, without any enlargements.  Lymph Node Survey No cervical supraclavicular or inguinal adenopathy Cardiovascular  Pulse normal rate, regularity and rhythm. S1 and S2 normal.  Lungs  Clear to auscultation bilateraly, without wheezes/crackles/rhonchi. Good air movement.  Skin  No rash/lesions/breakdown  Psychiatry  Alert and oriented to person, place, and time  Abdomen  Normoactive bowel sounds, abdomen soft, non-tender and nonobese without evidence of hernia.  Back No CVA tenderness Genito Urinary  Vulva/vagina: Normal external female genitalia.  No lesions. No discharge or bleeding.             Bladder/urethra:  No lesions or masses, well supported bladder             Vagina: normal             Cervix: Normal appearing, no lesions.             Uterus:  Small, mobile, no parametrial involvement or nodularity.             Adnexa: no palpable masses. Rectal  Good tone, no masses no cul de sac nodularity. Large burden of stool. Extremities  No bilateral cyanosis, clubbing or edema.   Denise Eva, MD

## 2017-04-22 NOTE — Telephone Encounter (Signed)
Scheduled post op appt for the patient. Patient to receive appt with her discharge summary.

## 2017-04-22 NOTE — Anesthesia Procedure Notes (Signed)
Procedure Name: Intubation Date/Time: 04/22/2017 10:37 AM Performed by: West Pugh, CRNA Pre-anesthesia Checklist: Patient identified, Emergency Drugs available, Suction available, Patient being monitored and Timeout performed Patient Re-evaluated:Patient Re-evaluated prior to induction Oxygen Delivery Method: Circle system utilized Preoxygenation: Pre-oxygenation with 100% oxygen Induction Type: IV induction Ventilation: Mask ventilation without difficulty and Oral airway inserted - appropriate to patient size Laryngoscope Size: Mac and 3 Grade View: Grade I Tube type: Oral Tube size: 7.0 mm Number of attempts: 1 Airway Equipment and Method: Stylet Placement Confirmation: ETT inserted through vocal cords under direct vision,  positive ETCO2,  CO2 detector and breath sounds checked- equal and bilateral Secured at: 21 cm Tube secured with: Tape Dental Injury: Teeth and Oropharynx as per pre-operative assessment

## 2017-04-22 NOTE — Anesthesia Postprocedure Evaluation (Signed)
Anesthesia Post Note  Patient: LATISA BELAY  Procedure(s) Performed: XI ROBOTIC ASSISTED TOTAL HYSTERECTOMY WITH BILATERAL SALPINGO OOPHORECTOMY (Bilateral ) OMENTECTOMY (N/A ) EXPLORATORY LAPAROTOMY (N/A ) TUMOR DEBULKING (N/A )     Patient location during evaluation: PACU Anesthesia Type: General Level of consciousness: sedated, patient cooperative and oriented Pain management: pain level controlled Vital Signs Assessment: post-procedure vital signs reviewed and stable Respiratory status: spontaneous breathing, nonlabored ventilation, respiratory function stable and patient connected to nasal cannula oxygen Cardiovascular status: blood pressure returned to baseline and stable Postop Assessment: no apparent nausea or vomiting and adequate PO intake (nausea much improved) Anesthetic complications: no    Last Vitals:  Vitals:   04/22/17 1544 04/22/17 1545  BP:  (!) 131/47  Pulse: (!) 47 (!) 49  Resp: (!) 27 (!) 23  Temp:    SpO2: 100% 100%    Last Pain:  Vitals:   04/22/17 1530  TempSrc:   PainSc: Asleep                 Cheston Coury,E. Sanford Lindblad

## 2017-04-22 NOTE — Transfer of Care (Signed)
Immediate Anesthesia Transfer of Care Note  Patient: Denise Bonilla  Procedure(s) Performed: XI ROBOTIC ASSISTED TOTAL HYSTERECTOMY WITH BILATERAL SALPINGO OOPHORECTOMY (Bilateral ) OMENTECTOMY (N/A ) EXPLORATORY LAPAROTOMY (N/A ) TUMOR DEBULKING (N/A )  Patient Location: PACU  Anesthesia Type:General  Level of Consciousness: awake, alert  and patient cooperative  Airway & Oxygen Therapy: Patient Spontanous Breathing and Patient connected to face mask oxygen  Post-op Assessment: Report given to RN, Post -op Vital signs reviewed and stable and Patient moving all extremities X 4  Post vital signs: Reviewed and stable  Last Vitals:  Vitals:   04/22/17 0749 04/22/17 1338  BP: 129/61 (!) 158/69  Pulse: 64 (!) 59  Resp: 16   Temp: 36.8 C   SpO2: 100% 100%    Last Pain:  Vitals:   04/22/17 0749  TempSrc: Oral      Patients Stated Pain Goal: 4 (14/70/92 9574)  Complications: No apparent anesthesia complications

## 2017-04-23 LAB — BASIC METABOLIC PANEL
ANION GAP: 8 (ref 5–15)
BUN: 16 mg/dL (ref 6–20)
CALCIUM: 9.3 mg/dL (ref 8.9–10.3)
CO2: 25 mmol/L (ref 22–32)
CREATININE: 1 mg/dL (ref 0.44–1.00)
Chloride: 103 mmol/L (ref 101–111)
GFR, EST AFRICAN AMERICAN: 58 mL/min — AB (ref >60)
GFR, EST NON AFRICAN AMERICAN: 50 mL/min — AB (ref >60)
Glucose, Bld: 145 mg/dL — ABNORMAL HIGH (ref 65–99)
Potassium: 3.9 mmol/L (ref 3.5–5.1)
SODIUM: 136 mmol/L (ref 135–145)

## 2017-04-23 LAB — CBC
HEMATOCRIT: 32.9 % — AB (ref 36.0–46.0)
Hemoglobin: 11.7 g/dL — ABNORMAL LOW (ref 12.0–15.0)
MCH: 35.8 pg — ABNORMAL HIGH (ref 26.0–34.0)
MCHC: 35.6 g/dL (ref 30.0–36.0)
MCV: 100.6 fL — ABNORMAL HIGH (ref 78.0–100.0)
PLATELETS: 211 10*3/uL (ref 150–400)
RBC: 3.27 MIL/uL — ABNORMAL LOW (ref 3.87–5.11)
RDW: 14.8 % (ref 11.5–15.5)
WBC: 11.1 10*3/uL — AB (ref 4.0–10.5)

## 2017-04-23 MED ORDER — ENOXAPARIN (LOVENOX) PATIENT EDUCATION KIT
PACK | Freq: Once | Status: AC
  Administered 2017-04-23: 12:00:00
  Filled 2017-04-23: qty 1

## 2017-04-23 NOTE — Progress Notes (Signed)
1 Day Post-Op Procedure(s) (LRB): XI ROBOTIC ASSISTED TOTAL HYSTERECTOMY WITH BILATERAL SALPINGO OOPHORECTOMY (Bilateral) OMENTECTOMY (N/A) EXPLORATORY LAPAROTOMY (N/A) TUMOR DEBULKING (N/A)  Subjective: Patient reports moderate pain, nausea, has not yet eaten, feels a little better than yesterday.    Objective: Vital signs in last 24 hours: Temp:  [96.3 F (35.7 C)-98.6 F (37 C)] 97.6 F (36.4 C) (11/13 2147) Pulse Rate:  [46-63] 63 (11/13 2147) Resp:  [13-28] 18 (11/13 2147) BP: (123-176)/(47-109) 176/81 (11/13 2147) SpO2:  [86 %-100 %] 100 % (11/13 2147) Last BM Date: (PTA)  Intake/Output from previous day: 11/13 0701 - 11/14 0700 In: 2626.7 [P.O.:60; I.V.:2566.7] Out: 1325 [Urine:1150; Emesis/NG output:25; Blood:150]  Physical Examination: General: alert and cooperative GI: soft, non-tender; bowel sounds normal; no masses,  no organomegaly incisions clean and in tact, dressing on upper abdominal incision.  Labs: WBC/Hgb/Hct/Plts:  11.1/11.7/32.9/211 (11/14 0541) BUN/Cr/glu/ALT/AST/amyl/lip:  16/1.00/--/--/--/--/-- (11/14 0541)   Assessment:  83 y.o. s/p Procedure(s): XI ROBOTIC ASSISTED TOTAL HYSTERECTOMY WITH BILATERAL SALPINGO OOPHORECTOMY OMENTECTOMY EXPLORATORY LAPAROTOMY TUMOR DEBULKING: stable Pain:  Pain is well-controlled on oral medications.  Heme:Anemia: chemotherapy induced in concert with postop acute blood loss anemia. Mild and asymptomatic, no transfusion required.  CV: stable.  GI:  Tolerating po: Yes   Will advance diet today.   FEN: appropriate creatinine. Will heplock IVF  Prophylaxis: pharmacologic prophylaxis (with any of the following: enoxaparin (Lovenox) 40mg  SQ 2 hours prior to surgery then every day).  Plan: Advance diet Encourage ambulation Discontinue IV fluids anticipate discharge tomorrow - on 2 weeks lovenox. Dispo:  Discharge plan to include :consults: @CM @, Social Work The patient is to be discharged to home.   LOS: 1  day    Denise Bonilla 04/23/2017, 8:57 AM

## 2017-04-24 ENCOUNTER — Telehealth: Payer: Self-pay | Admitting: Gynecologic Oncology

## 2017-04-24 MED ORDER — TRAMADOL HCL 50 MG PO TABS: 50.0000 mg | ORAL_TABLET | Freq: Four times a day (QID) | ORAL | 0 refills | Status: DC | PRN

## 2017-04-24 NOTE — Discharge Instructions (Signed)
04/24/2017  Return to work: 4-6 weeks if applicable  Activity: 1. Be up and out of the bed during the day.  Take a nap if needed.  You may walk up steps but be careful and use the hand rail.  Stair climbing will tire you more than you think, you may need to stop part way and rest.   2. No lifting or straining for 6 weeks.  3. No driving for 1 week(s) if cleared to drive before surgery.  Do not drive if you are taking narcotic pain medicine.  4. Shower daily.  Use soap and water on your incision and pat dry; don't rub.  No tub baths until cleared by your surgeon.   5. No sexual activity and nothing in the vagina for 8 weeks.  6. You may experience a small amount of clear drainage from your incisions, which is normal.  If the drainage persists or increases, please call the office.  7. You may experience vaginal spotting after surgery or around the 6-8 week mark from surgery when the stitches at the top of the vagina begin to dissolve.  The spotting is normal but if you experience heavy bleeding, call our office.  8. Take Tylenol or ibuprofen first for pain and only use Percocet for severe pain not relieved by the Tylenol or Ibuprofen.  Monitor your Tylenol intake to a max of 4,000 mg a day since Percocet has Tylenol in it as well.  Diet: 1. Low sodium Heart Healthy Diet is recommended.  2. It is safe to use a laxative, such as Miralax or Colace, if you have difficulty moving your bowels. You can take Sennakot at bedtime every evening to keep bowel movements regular and to prevent constipation.    Wound Care: 1. Keep clean and dry.  Shower daily.  Reasons to call the Doctor:  Fever - Oral temperature greater than 100.4 degrees Fahrenheit  Foul-smelling vaginal discharge  Difficulty urinating  Nausea and vomiting  Increased pain at the site of the incision that is unrelieved with pain medicine.  Difficulty breathing with or without chest pain  New calf pain especially if only  on one side  Sudden, continuing increased vaginal bleeding with or without clots.   Contacts: For questions or concerns you should contact:  Dr. Everitt Amber at [phone]  Joylene John, NP at [phone]  After Hours: call [phone] and have the GYN Oncologist paged/contacted   How and Where to Give Subcutaneous Enoxaparin Injections Enoxaparin is an injectable medicine. It is used to help prevent blood clots from developing in your veins. Health care providers often use anticoagulants like enoxaparin to prevent clots following surgery. Enoxaparin is also used in combination with other medicines to treat blood clots and heart attacks. If blood clots are left untreated, they can be life threatening. Enoxaparin comes in single-use syringes. You inject enoxaparin through a syringe into your belly (abdomen). You should change the injection site each time you give yourself a shot. Continue the enoxaparin injections as directed by your health care provider. Your health care provider will use blood clotting test results to decide when you can safely stop using enoxaparin injections. If your health care provider prescribes any additional medicines, use the medicines exactly as directed. How do I inject enoxaparin? 1. Wash your hands with soap and water. 2. Clean the selected injection site as directed by your health care provider. 3. Remove the needle cap by pulling it straight off the syringe. 4. When using a prefilled  syringe, do not push the air bubble out of the syringe before the injection. The air bubble will help you get all of the medicine out of the syringe. 5. Hold the syringe like a pencil using your writing hand. 6. Use your other hand to pinch and hold an inch of the cleansed skin. 7. Insert the entire needle straight down into the fold of skin. 8. Push the plunger with your thumb until the syringe is empty. 9. Pull the needle straight out of your skin. 10. Enoxaparin injection  prefilled syringes and graduated prefilled syringes are available with a system that shields the needle after injection. After you have completed your injection and removed the needle from your skin, firmly push down on the plunger. The protective sleeve will automatically cover the needle and you will hear a click. The click means the needle is safely covered. 11. Place the syringe in the nearest needle box, also called a sharps container. If you do not have a sharps container, you can use a hard-sided plastic container with a secure lid, such as an empty laundry detergent bottle. What else do I need to know?  Do not use enoxaparin if: ? You have allergies to heparin or pork products. ? You have been diagnosed with a condition called thrombocytopenia.  Do not use the syringe or needle more than one time.  Use medicines only as directed by your health care provider.  Changes in medicines, supplements, diet, and illness can affect your anticoagulation therapy. Be sure to inform your health care provider of any of these changes.  It is important that you tell all of your health care providers and your dentist that you are taking an anticoagulant, especially if you are injured or plan to have any type of procedure.  While on anticoagulants, you will need to have blood tests done routinely as directed by your health care provider.  While using this medicine, avoid physical activities or sports that could result in a fall or cause injury.  Follow up with your laboratory test and health care provider appointments as directed. It is very important to keep your appointments. Not keeping appointments could result in a chronic or permanent injury, pain, or disability.  Before giving your medicine, you should make sure the injection is a clear and colorless or pale yellow solution. If your medicine becomes discolored or if there are particles in the syringe, do not use it and notify your health care  provider.  Keep your medicine safely stored at room temperature. Contact a health care provider if:  You develop any rashes on your skin.  You have large areas of bruising on your skin.  You have any worsening of the condition for which you take Enoxaparin.  You develop a fever. Get help right away if:  You develop bleeding problems such as: ? Bleeding from the gums or nose that does not stop quickly. ? Vomiting blood or coughing up blood. ? Blood in your urine. ? Blood in your stool, or stool that has a dark, tarry, or coffee grounds appearance. ? A cut that does not stop bleeding within 10 minutes. These symptoms may represent a serious problem that is an emergency. Do not wait to see if the symptoms will go away. Get medical help right away. Call your local emergency services (911 in the [place] Do not drive yourself to the hospital. This information is not intended to replace advice given to you by your health care provider. Make sure  you discuss any questions you have with your health care provider. Document Released: 03/28/2004 Document Revised: 02/01/2016 Document Reviewed: 11/11/2013 Elsevier Interactive Patient Education  2017 [employer].  Tramadol tablets What is this medicine? TRAMADOL (TRA ma dole) is a pain reliever. It is used to treat moderate to severe pain in adults. This medicine may be used for other purposes; ask your health care provider or pharmacist if you have questions. COMMON BRAND NAME(S): Ultram What should I tell my health care provider before I take this medicine? They need to know if you have any of these conditions: -brain tumor -depression -drug abuse or addiction -head injury -if you frequently drink alcohol containing drinks -kidney disease or trouble passing urine -liver disease -lung disease, asthma, or breathing problems -seizures or epilepsy -suicidal thoughts, plans, or attempt; a previous suicide attempt by you or a family  member -an unusual or allergic reaction to tramadol, codeine, other medicines, foods, dyes, or preservatives -pregnant or trying to get pregnant -breast-feeding How should I use this medicine? Take this medicine by mouth with a full glass of water. Follow the directions on the prescription label. You can take it with or without food. If it upsets your stomach, take it with food. Do not take your medicine more often than directed. A special MedGuide will be given to you by the pharmacist with each prescription and refill. Be sure to read this information carefully each time. Talk to your pediatrician regarding the use of this medicine in children. Special care may be needed. Overdosage: If you think you have taken too much of this medicine contact a poison control center or emergency room at once. NOTE: This medicine is only for you. Do not share this medicine with others. What if I miss a dose? If you miss a dose, take it as soon as you can. If it is almost time for your next dose, take only that dose. Do not take double or extra doses. What may interact with this medicine? Do not take this medication with any of the following medicines: -MAOIs like Carbex, Eldepryl, Marplan, Nardil, and Parnate This medicine may also interact with the following medications: -alcohol -antihistamines for allergy, cough and cold -certain medicines for anxiety or sleep -certain medicines for depression like amitriptyline, fluoxetine, sertraline -certain medicines for migraine headache like almotriptan, eletriptan, frovatriptan, naratriptan, rizatriptan, sumatriptan, zolmitriptan -certain medicines for seizures like carbamazepine, oxcarbazepine, phenobarbital, primidone -certain medicines that treat or prevent blood clots like warfarin -digoxin -furazolidone -general anesthetics like halothane, isoflurane, methoxyflurane, propofol -linezolid -local anesthetics like lidocaine, pramoxine, tetracaine -medicines  that relax muscles for surgery -other narcotic medicines for pain or cough -phenothiazines like chlorpromazine, mesoridazine, prochlorperazine, thioridazine -procarbazine This list may not describe all possible interactions. Give your health care provider a list of all the medicines, herbs, non-prescription drugs, or dietary supplements you use. Also tell them if you smoke, drink alcohol, or use illegal drugs. Some items may interact with your medicine. What should I watch for while using this medicine? Tell your doctor or health care professional if your pain does not go away, if it gets worse, or if you have new or a different type of pain. You may develop tolerance to the medicine. Tolerance means that you will need a higher dose of the medicine for pain relief. Tolerance is normal and is expected if you take this medicine for a long time. Do not suddenly stop taking your medicine because you may develop a severe reaction. Your body becomes used  to the medicine. This does NOT mean you are addicted. Addiction is a behavior related to getting and using a drug for a non-medical reason. If you have pain, you have a medical reason to take pain medicine. Your doctor will tell you how much medicine to take. If your doctor wants you to stop the medicine, the dose will be slowly lowered over time to avoid any side effects. There are different types of narcotic medicines (opiates). If you take more than one type at the same time or if you are taking another medicine that also causes drowsiness, you may have more side effects. Give your health care provider a list of all medicines you use. Your doctor will tell you how much medicine to take. Do not take more medicine than directed. Call emergency for help if you have problems breathing or unusual sleepiness. You may get drowsy or dizzy. Do not drive, use machinery, or do anything that needs mental alertness until you know how this medicine affects you. Do not stand  or sit up quickly, especially if you are an older patient. This reduces the risk of dizzy or fainting spells. Alcohol can increase or decrease the effects of this medicine. Avoid alcoholic drinks. You may have constipation. Try to have a bowel movement at least every 2 to 3 days. If you do not have a bowel movement for 3 days, call your doctor or health care professional. Your mouth may get dry. Chewing sugarless gum or sucking hard candy, and drinking plenty of water may help. Contact your doctor if the problem does not go away or is severe. What side effects may I notice from receiving this medicine? Side effects that you should report to your doctor or health care professional as soon as possible: -allergic reactions like skin rash, itching or hives, swelling of the face, lips, or tongue -breathing problems -confusion -seizures -signs and symptoms of low blood pressure like dizziness; feeling faint or lightheaded, falls; unusually weak or tired -trouble passing urine or change in the amount of urine Side effects that usually do not require medical attention (report to your doctor or health care professional if they continue or are bothersome): -constipation -dry mouth -nausea, vomiting -tiredness This list may not describe all possible side effects. Call your doctor for medical advice about side effects. You may report side effects to FDA at 1-800-FDA-1088. Where should I keep my medicine? Keep out of the reach of children. This medicine may cause accidental overdose and death if it taken by other adults, children, or pets. Mix any unused medicine with a substance like cat litter or coffee grounds. Then throw the medicine away in a sealed container like a sealed bag or a coffee can with a lid. Do not use the medicine after the expiration date. Store at room temperature between 15 and 30 degrees C (59 and 86 degrees F). NOTE: This sheet is a summary. It may not cover all possible information.  If you have questions about this medicine, talk to your doctor, pharmacist, or health care provider.  2018 Elsevier/Gold Standard (2015-02-19 09:00:04)

## 2017-04-24 NOTE — Telephone Encounter (Signed)
Called patient's daughter to follow up on her concerns about her mother taking two diuretics.  Informed her that I reached out to Dr. Manuella Ghazi and he stated there was no harm in taking both and he could discuss at his next visit.  Daughter advised to call for any questions or concerns.

## 2017-04-24 NOTE — Discharge Summary (Signed)
Physician Discharge Summary  Patient ID: Denise Bonilla MRN: [id number] DOB/AGE: 1930-04-11 83 y.o.  Admit date: 04/22/2017 Discharge date: 04/24/2017  Admission Diagnoses: Ovarian cancer [facility])  Discharge Diagnoses:  Principal Problem:   Ovarian cancer [facility])   Discharged Condition:  The patient is in good condition and stable for discharge.    Hospital Course: On 04/22/2017, the patient underwent the following: Procedure(s): XI ROBOTIC ASSISTED TOTAL HYSTERECTOMY WITH BILATERAL SALPINGO OOPHORECTOMY OMENTECTOMY. EXPLORATORY LAPAROTOMY, TUMOR DEBULKING.   The postoperative course was uneventful.  She was discharged to home on postoperative day 2 tolerating a regular diet, voiding, ambulating.  She is discharged home with lovenox for a total of 2 weeks post-op.  She was given 12 lovenox injections from the [facility] at discharge.  Consults: None  Significant Diagnostic Studies: None  Treatments: surgery: see above  Discharge Exam: Blood pressure (!) 99/41, pulse 65, temperature 97.7 F (36.5 C), temperature source Oral, resp. rate 16, height 5\' 2"  (1.575 m), weight 134 lb (60.8 kg), SpO2 98 %. General appearance: alert, cooperative and no distress Resp: clear to auscultation bilaterally Cardio: regular rate and rhythm, S1, S2 normal, no murmur, click, rub or gallop GI: soft, active bowel sounds, slightly distended but soft, mildly tympanic Extremities: extremities normal, atraumatic, no cyanosis or edema Incision/Wound: Lap sites with dermabond without erythema or drainage.  Honeycomb dressing in place with no evidence of drainage.    Disposition: 01-Home or Self Care  Discharge Instructions    Call MD for:  difficulty breathing, headache or visual disturbances   Complete by:  As directed    Call MD for:  extreme fatigue   Complete by:  As directed    Call MD for:  hives   Complete by:  As directed    Call MD for:  persistant dizziness or light-headedness   Complete by:   As directed    Call MD for:  persistant nausea and vomiting   Complete by:  As directed    Call MD for:  redness, tenderness, or signs of infection (pain, swelling, redness, odor or green/yellow discharge around incision site)   Complete by:  As directed    Call MD for:  severe uncontrolled pain   Complete by:  As directed    Call MD for:  temperature >100.4   Complete by:  As directed    Diet - low sodium heart healthy   Complete by:  As directed    Driving Restrictions   Complete by:  As directed    No driving for 1 week if cleared to drive previously.  Do not take narcotics and drive.   Increase activity slowly   Complete by:  As directed    Lifting restrictions   Complete by:  As directed    No lifting greater than 10 lbs.   Sexual Activity Restrictions   Complete by:  As directed    No sexual activity, nothing in the vagina, for 8 weeks.     Allergies as of 04/24/2017      Reactions   Tetanus Toxoids Anaphylaxis   Codeine Nausea And Vomiting      Medication List    TAKE these medications   acetaminophen 500 MG tablet Commonly known as:  TYLENOL Take 500 mg by mouth every 8 (eight) hours as needed for mild pain or headache.   aspirin EC 81 MG tablet Take 81 mg by mouth daily.   bisoprolol-hydrochlorothiazide 10-6.25 MG tablet Commonly known as:  ZIAC Take 1  tablet by mouth daily.   dexamethasone 4 MG tablet Commonly known as:  DECADRON 5 tabs the night before and 5 tabs in the morning of chemo, take with food, every 3 weeks   furosemide 20 MG tablet Commonly known as:  LASIX Take 1 tablet (20 mg total) by mouth daily.   Krill Oil 500 MG Caps Take 500 mg by mouth daily.   lidocaine-prilocaine cream Commonly known as:  EMLA Apply to affected area once What changed:    how much to take  how to take this  when to take this  additional instructions   loratadine 10 MG tablet Commonly known as:  CLARITIN Take 10 mg by mouth daily as needed (before  and after chemo treatment).   MULTI FOR HER 50+ Tabs Take 1 tablet by mouth daily.   ondansetron 8 MG tablet Commonly known as:  ZOFRAN Take three times a day as needed What changed:    how much to take  how to take this  when to take this  reasons to take this  additional instructions   prochlorperazine 10 MG tablet Commonly known as:  COMPAZINE Take 1 tablet (10 mg total) by mouth every 6 (six) hours as needed (Nausea or vomiting).   simvastatin 20 MG tablet Commonly known as:  ZOCOR Take 20 mg by mouth daily at 6 PM.   traMADol 50 MG tablet Commonly known as:  ULTRAM Take 1 tablet (50 mg total) every 6 (six) hours as needed by mouth.   Vitamin D 2000 units tablet Take 2,000 Units by mouth daily.        Greater than thirty minutes were spend for face to face discharge instructions and discharge orders/summary in EPIC.   Signed: CROSS, MELISSA DEAL 04/24/2017, 12:54 PM

## 2017-05-05 ENCOUNTER — Encounter: Payer: Self-pay | Admitting: Gynecologic Oncology

## 2017-05-05 ENCOUNTER — Other Ambulatory Visit: Payer: Self-pay | Admitting: Hematology and Oncology

## 2017-05-05 ENCOUNTER — Ambulatory Visit: Payer: Medicare Other | Attending: Gynecologic Oncology | Admitting: Gynecologic Oncology

## 2017-05-05 VITALS — BP 136/66 | HR 63 | Temp 97.8°F | Resp 18 | Wt 134.1 lb

## 2017-05-05 DIAGNOSIS — F0390 Unspecified dementia without behavioral disturbance: Secondary | ICD-10-CM | POA: Diagnosis not present

## 2017-05-05 DIAGNOSIS — Z7982 Long term (current) use of aspirin: Secondary | ICD-10-CM | POA: Diagnosis not present

## 2017-05-05 DIAGNOSIS — Z887 Allergy status to serum and vaccine status: Secondary | ICD-10-CM | POA: Insufficient documentation

## 2017-05-05 DIAGNOSIS — I110 Hypertensive heart disease with heart failure: Secondary | ICD-10-CM | POA: Diagnosis not present

## 2017-05-05 DIAGNOSIS — Z90722 Acquired absence of ovaries, bilateral: Secondary | ICD-10-CM | POA: Insufficient documentation

## 2017-05-05 DIAGNOSIS — Z8249 Family history of ischemic heart disease and other diseases of the circulatory system: Secondary | ICD-10-CM | POA: Diagnosis not present

## 2017-05-05 DIAGNOSIS — T8149XA Infection following a procedure, other surgical site, initial encounter: Secondary | ICD-10-CM

## 2017-05-05 DIAGNOSIS — Z803 Family history of malignant neoplasm of breast: Secondary | ICD-10-CM | POA: Insufficient documentation

## 2017-05-05 DIAGNOSIS — Z9221 Personal history of antineoplastic chemotherapy: Secondary | ICD-10-CM | POA: Diagnosis not present

## 2017-05-05 DIAGNOSIS — I509 Heart failure, unspecified: Secondary | ICD-10-CM | POA: Insufficient documentation

## 2017-05-05 DIAGNOSIS — Z79899 Other long term (current) drug therapy: Secondary | ICD-10-CM | POA: Insufficient documentation

## 2017-05-05 DIAGNOSIS — C786 Secondary malignant neoplasm of retroperitoneum and peritoneum: Secondary | ICD-10-CM

## 2017-05-05 DIAGNOSIS — Z90710 Acquired absence of both cervix and uterus: Secondary | ICD-10-CM | POA: Diagnosis not present

## 2017-05-05 DIAGNOSIS — C561 Malignant neoplasm of right ovary: Secondary | ICD-10-CM | POA: Insufficient documentation

## 2017-05-05 DIAGNOSIS — E785 Hyperlipidemia, unspecified: Secondary | ICD-10-CM | POA: Diagnosis not present

## 2017-05-05 DIAGNOSIS — Z7189 Other specified counseling: Secondary | ICD-10-CM

## 2017-05-05 DIAGNOSIS — Z885 Allergy status to narcotic agent status: Secondary | ICD-10-CM | POA: Diagnosis not present

## 2017-05-05 DIAGNOSIS — L03311 Cellulitis of abdominal wall: Secondary | ICD-10-CM

## 2017-05-05 DIAGNOSIS — Z808 Family history of malignant neoplasm of other organs or systems: Secondary | ICD-10-CM

## 2017-05-05 MED ORDER — CEPHALEXIN 500 MG PO CAPS: 500.0000 mg | ORAL_CAPSULE | Freq: Two times a day (BID) | ORAL | 0 refills | Status: DC

## 2017-05-05 NOTE — Progress Notes (Signed)
Consult Note: Gyn-Onc  Consult was requested by Dr. Karleen Hampshire for the evaluation of Denise Bonilla 83 y.o. female  CC:  Chief Complaint  Patient presents with  . Ovarian cancer on right [facility])    Assessment/Plan:  Denise Bonilla  is a 83 y.o.  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  I am recommending 3 additional cycles of adjuvant carb/tax.  Keflex x 1 week for possible early cellulitis of wound.  Needs genetics consultation.  HPI: Denise Bonilla is a 83 year old P3 who is seen in consultation at the request of Dr Karleen Hampshire for presumed stage IV right ovarian cancer.  The patient has been having nausea and anorexia (but without weight loss or GI obstruction symptoms) since June, 2018. Her daughter took her to an ED for this where she was treated for UTI. Chest imaging had revealed a large pleural effusion. She had a small right pleural effusion.   CT chest/abdo/pelvis on 12/12/16 showed a left pleural effusion, tiny right pleural effusion. Nonspecific ill defined low density in the inferior right lobe of the liver. No bowel obstruction. Large right adnexal cystic structure measures 8.5x6.5cm with internal septations. Left ovary not well definied. No gross left adnexal mass. Uterus is abnormal in appearance for age with endometrial thinkening and fluid in the canal. Posterior heterogeneous lesion in the uterus may be fibroid. Small volume of upper abdominal ascites.   Cytology from the thoracentesis on 12/08/16 showed metastatic adenocarcinoma with positive stains for CK7, Pax 8, WT1, negative for CK 20 and ER. Overall favored gyn primary.  CA 125 on 12/13/16 was elevated at 1310 CA 19-9 was elevated at 357.  The patient has had a prior left ovarian cystectomy (possible LSO) remotely in the past for a benign ovarian cyst.  She has a history of  dementia but is currently living with her daughter and fairly independent with most activities. She is a fair historian.  She went on 2 receive 4 cycles of carb/tax chemotherapy ( 01/01/17 - 03/06/17).  She required admission after cycle 1 for nausea and emesis. She has improved PS after each cycle and is now doing well with chemotherapy.  CA 125 was 128 on 03/06/17 (day 1 of cycle 4).  CT abdo/pelvison 03/05/17 showed a persistent right ovarian cyst measuring 8.5cm with decrease in ascites, and persistent but improved left omental thickening.   Interval Hx:  She received cycle 4 of chemotherapy then underwent debulking (interval) surgery on 04/22/17 with robotic assisted total hysterectomy, BSO, and minilaparotomy for total omentectomy. Surgical findings were significant for a 7cm right ovarian cyst, and a 4cm tumor nodule in the left upper quadrant omentum. At the end of the procedure there was no gross residual disease remaining, an R0 optimal cytoreduction.  Final pathology confirmed a right ovarian high grade serous carcinoma metastatic to the omentum.  Since surgery she has done well with no complaints.  Current Meds:  Outpatient Encounter Medications as of 05/05/2017  Medication Sig  . acetaminophen (TYLENOL) 500 MG tablet Take 500 mg by mouth every 8 (eight) hours as needed for mild pain or headache.  Marland Kitchen aspirin EC 81 MG tablet Take 81 mg by mouth daily.  . bisoprolol-hydrochlorothiazide (ZIAC) 10-6.25 MG tablet Take 1 tablet by mouth daily.  . Cholecalciferol (VITAMIN D) 2000 units tablet Take 2,000 Units by mouth daily.  Marland Kitchen  dexamethasone (DECADRON) 4 MG tablet 5 tabs the night before and 5 tabs in the morning of chemo, take with food, every 3 weeks  . furosemide (LASIX) 20 MG tablet Take 1 tablet (20 mg total) by mouth daily.  Javier Docker Oil 500 MG CAPS Take 500 mg by mouth daily.  Marland Kitchen lidocaine-prilocaine (EMLA) cream Apply to affected area once (Patient taking differently: Apply 1  application topically See admin instructions. Apply to affected area once)  . loratadine (CLARITIN) 10 MG tablet Take 10 mg by mouth daily as needed (before and after chemo treatment).  . Multiple Vitamins-Minerals (MULTI FOR HER 50+) TABS Take 1 tablet by mouth daily.  . ondansetron (ZOFRAN) 8 MG tablet Take three times a day as needed (Patient taking differently: Take 8 mg by mouth every 8 (eight) hours as needed for nausea or vomiting. Take three times a day as needed)  . prochlorperazine (COMPAZINE) 10 MG tablet Take 1 tablet (10 mg total) by mouth every 6 (six) hours as needed (Nausea or vomiting).  . simvastatin (ZOCOR) 20 MG tablet Take 20 mg by mouth daily at 6 PM.   . traMADol (ULTRAM) 50 MG tablet Take 1 tablet (50 mg total) every 6 (six) hours as needed by mouth.  . cephALEXin (KEFLEX) 500 MG capsule Take 1 capsule (500 mg total) by mouth 2 (two) times daily.   No facility-administered encounter medications on file as of 05/05/2017.     Allergy:  Allergies  Allergen Reactions  . Tetanus Toxoids Anaphylaxis  . Codeine Nausea And Vomiting    Social Hx:   Social History   Socioeconomic History  . Marital status: Widowed    Spouse name: Not on file  . Number of children: 3  . Years of education: Not on file  . Highest education level: Not on file  Social Needs  . Financial resource strain: Not on file  . Food insecurity - worry: Not on file  . Food insecurity - inability: Not on file  . Transportation needs - medical: Not on file  . Transportation needs - non-medical: Not on file  Occupational History  . Occupation: retired  Tobacco Use  . Smoking status: Never Smoker  . Smokeless tobacco: Never Used  Substance and Sexual Activity  . Alcohol use: No  . Drug use: No  . Sexual activity: No  Other Topics Concern  . Not on file  Social History Narrative  . Not on file    Past Surgical Hx:  Past Surgical History:  Procedure Laterality Date  . ABDOMINAL  HYSTERECTOMY     04/22/17 Dr. Denman George  . CYSTECTOMY    . DEBULKING N/A 04/22/2017   Procedure: TUMOR DEBULKING;  Surgeon: Everitt Amber, MD;  Location: [facility];  Service: Gynecology;  Laterality: N/A;  . INNER EAR SURGERY     cyst removed  . IR FLUORO GUIDE PORT INSERTION RIGHT  07/06/2016  . IR US GUIDE VASC ACCESS RIGHT  07/06/2016  . LAPAROTOMY N/A 04/22/2017   Procedure: EXPLORATORY LAPAROTOMY;  Surgeon: Everitt Amber, MD;  Location: [facility];  Service: Gynecology;  Laterality: N/A;  . OMENTECTOMY N/A 04/22/2017   Procedure: OMENTECTOMY;  Surgeon: Everitt Amber, MD;  Location: [facility];  Service: Gynecology;  Laterality: N/A;  . ROBOTIC ASSISTED TOTAL HYSTERECTOMY WITH BILATERAL SALPINGO OOPHERECTOMY Bilateral 04/22/2017   Procedure: XI ROBOTIC ASSISTED TOTAL HYSTERECTOMY WITH BILATERAL SALPINGO OOPHORECTOMY;  Surgeon: Everitt Amber, MD;  Location: [facility];  Service: Gynecology;  Laterality: Bilateral;    Past  Medical Hx:  Past Medical History:  Diagnosis Date  . CHF (congestive heart failure) (HCC)    mild  . Complication of anesthesia   . Dementia   . Family history of adverse reaction to anesthesia    Daughter [place] also  . Hyperlipidemia   . Hypertension   . Malignant pleural effusion    Left  . Ovarian cancer on right ([place]) 12/23/2016  . PONV (postoperative nausea and vomiting)     Past Gynecological History:  SVDx 3 No LMP recorded. Patient is postmenopausal.  Family Hx:  Family History  Problem Relation Age of Onset  . Cancer Other   . Hyperlipidemia Other   . Hypertension Other   . Heart failure Mother   . Breast cancer Sister     Review of Systems:  Constitutional  Feels well,    ENT Normal appearing ears and nares bilaterally Skin/Breast  No rash, sores, jaundice, itching, dryness Cardiovascular  No chest pain, shortness of breath, or edema  Pulmonary  No cough or wheeze.  Gastro Intestinal  No nausea, vomitting, or diarrhoea. No bright red blood per rectum, no  abdominal pain, change in bowel movement, or constipation.  Genito Urinary  No frequency, urgency, dysuria, no bleeding Musculo Skeletal  No myalgia, arthralgia, joint swelling or pain  Neurologic  No weakness, numbness, change in gait,  Psychology  No depression, anxiety, insomnia.   Vitals:  Blood pressure 136/66, pulse 63, temperature 97.8 F (36.6 C), temperature source Oral, resp. rate 18, weight 134 lb 1.6 oz (60.8 kg), SpO2 100 %.  Physical Exam: WD in NAD Neck  Supple NROM, without any enlargements.  Lymph Node Survey No cervical supraclavicular or inguinal adenopathy Cardiovascular  Pulse normal rate, regularity and rhythm. S1 and S2 normal.  Lungs  Clear to auscultation bilateraly, without wheezes/crackles/rhonchi. Good air movement.  Skin  No rash/lesions/breakdown  Psychiatry  Alert and oriented to person, place, and time  Abdomen  Normoactive bowel sounds, abdomen soft, non-tender and nonobese without evidence of hernia. 3cm area of blanching erythema around inferior aspect of wound. No drainage or fluctuance. Back No CVA tenderness Genito Urinary  Vulva/vagina: Normal external female genitalia.  No lesions. No discharge or bleeding.  Bladder/urethra:  No lesions or masses, well supported bladder  Vagina: cuff in tact, no bleeding, no masses.  Adnexa: no palpable masses. Rectal  deferred Extremities  No bilateral cyanosis, clubbing or edema.   20 minutes of direct face to face counseling time was spent with the patient. This included discussion about prognosis, therapy recommendations and postoperative side effects and are beyond the scope of routine postoperative care.   Donaciano Eva, MD  05/05/2017, 1:19 PM

## 2017-05-05 NOTE — Patient Instructions (Signed)
Please follow-up with Dr Alvy Bimler as scheduled to resume chemotherapy.  Dr Denman George will see you again after completing chemotherapy.  Please take Keflex twice a day by mouth for 1 week for skin infection in your incision.

## 2017-05-12 ENCOUNTER — Telehealth: Payer: Self-pay | Admitting: Hematology and Oncology

## 2017-05-12 NOTE — Telephone Encounter (Signed)
Spoke to patients daughter regarding upcoming December appointments per 11/26 sch message

## 2017-05-23 ENCOUNTER — Ambulatory Visit: Payer: Medicare Other

## 2017-05-23 ENCOUNTER — Telehealth: Payer: Self-pay | Admitting: Hematology and Oncology

## 2017-05-23 ENCOUNTER — Ambulatory Visit (HOSPITAL_BASED_OUTPATIENT_CLINIC_OR_DEPARTMENT_OTHER): Payer: Medicare Other | Admitting: Hematology and Oncology

## 2017-05-23 ENCOUNTER — Ambulatory Visit (HOSPITAL_BASED_OUTPATIENT_CLINIC_OR_DEPARTMENT_OTHER): Payer: Medicare Other

## 2017-05-23 ENCOUNTER — Encounter: Payer: Self-pay | Admitting: Hematology and Oncology

## 2017-05-23 ENCOUNTER — Other Ambulatory Visit (HOSPITAL_BASED_OUTPATIENT_CLINIC_OR_DEPARTMENT_OTHER): Payer: Medicare Other

## 2017-05-23 DIAGNOSIS — C561 Malignant neoplasm of right ovary: Secondary | ICD-10-CM

## 2017-05-23 DIAGNOSIS — Z5111 Encounter for antineoplastic chemotherapy: Secondary | ICD-10-CM | POA: Diagnosis not present

## 2017-05-23 DIAGNOSIS — D61818 Other pancytopenia: Secondary | ICD-10-CM

## 2017-05-23 DIAGNOSIS — Z95828 Presence of other vascular implants and grafts: Secondary | ICD-10-CM

## 2017-05-23 LAB — CBC WITH DIFFERENTIAL/PLATELET
BASO%: 0 % (ref 0.0–2.0)
Basophils Absolute: 0 10*3/uL (ref 0.0–0.1)
EOS ABS: 0 10*3/uL (ref 0.0–0.5)
EOS%: 0 % (ref 0.0–7.0)
HEMATOCRIT: 33.3 % — AB (ref 34.8–46.6)
HEMOGLOBIN: 11.3 g/dL — AB (ref 11.6–15.9)
LYMPH#: 1 10*3/uL (ref 0.9–3.3)
LYMPH%: 26.3 % (ref 14.0–49.7)
MCH: 33.4 pg (ref 25.1–34.0)
MCHC: 33.9 g/dL (ref 31.5–36.0)
MCV: 98.5 fL (ref 79.5–101.0)
MONO#: 0 10*3/uL — AB (ref 0.1–0.9)
MONO%: 0.5 % (ref 0.0–14.0)
NEUT%: 73.2 % (ref 38.4–76.8)
NEUTROS ABS: 2.8 10*3/uL (ref 1.5–6.5)
PLATELETS: 167 10*3/uL (ref 145–400)
RBC: 3.38 10*6/uL — ABNORMAL LOW (ref 3.70–5.45)
RDW: 12.8 % (ref 11.2–14.5)
WBC: 3.8 10*3/uL — AB (ref 3.9–10.3)
nRBC: 0 % (ref 0–0)

## 2017-05-23 LAB — COMPREHENSIVE METABOLIC PANEL
ALK PHOS: 71 U/L (ref 40–150)
ALT: 16 U/L (ref 0–55)
AST: 19 U/L (ref 5–34)
Albumin: 3.9 g/dL (ref 3.5–5.0)
Anion Gap: 13 mEq/L — ABNORMAL HIGH (ref 3–11)
BUN: 18.5 mg/dL (ref 7.0–26.0)
CHLORIDE: 102 meq/L (ref 98–109)
CO2: 22 mEq/L (ref 22–29)
Calcium: 9.8 mg/dL (ref 8.4–10.4)
Creatinine: 0.9 mg/dL (ref 0.6–1.1)
GLUCOSE: 191 mg/dL — AB (ref 70–140)
POTASSIUM: 3.6 meq/L (ref 3.5–5.1)
SODIUM: 137 meq/L (ref 136–145)
Total Bilirubin: 0.63 mg/dL (ref 0.20–1.20)
Total Protein: 6.8 g/dL (ref 6.4–8.3)

## 2017-05-23 MED ORDER — SODIUM CHLORIDE 0.9 % IV SOLN
Freq: Once | INTRAVENOUS | Status: AC
  Administered 2017-05-23: 10:00:00 via INTRAVENOUS
  Filled 2017-05-23: qty 5

## 2017-05-23 MED ORDER — PEGFILGRASTIM 6 MG/0.6ML ~~LOC~~ PSKT
PREFILLED_SYRINGE | SUBCUTANEOUS | Status: AC
  Filled 2017-05-23: qty 0.6

## 2017-05-23 MED ORDER — PALONOSETRON HCL INJECTION 0.25 MG/5ML
0.2500 mg | Freq: Once | INTRAVENOUS | Status: AC
  Administered 2017-05-23: 0.25 mg via INTRAVENOUS

## 2017-05-23 MED ORDER — DIPHENHYDRAMINE HCL 50 MG/ML IJ SOLN
INTRAMUSCULAR | Status: AC
  Filled 2017-05-23: qty 1

## 2017-05-23 MED ORDER — SODIUM CHLORIDE 0.9% FLUSH
10.0000 mL | INTRAVENOUS | Status: DC | PRN
  Administered 2017-05-23: 10 mL
  Filled 2017-05-23: qty 10

## 2017-05-23 MED ORDER — DEXAMETHASONE 4 MG PO TABS: ORAL_TABLET | ORAL | 1 refills | Status: DC

## 2017-05-23 MED ORDER — PALONOSETRON HCL INJECTION 0.25 MG/5ML
INTRAVENOUS | Status: AC
  Filled 2017-05-23: qty 5

## 2017-05-23 MED ORDER — PACLITAXEL CHEMO INJECTION 300 MG/50ML
131.2500 mg/m2 | Freq: Once | INTRAVENOUS | Status: AC
  Administered 2017-05-23: 228 mg via INTRAVENOUS
  Filled 2017-05-23: qty 38

## 2017-05-23 MED ORDER — SODIUM CHLORIDE 0.9 % IV SOLN
317.7000 mg | Freq: Once | INTRAVENOUS | Status: AC
  Administered 2017-05-23: 320 mg via INTRAVENOUS
  Filled 2017-05-23: qty 32

## 2017-05-23 MED ORDER — FAMOTIDINE IN NACL 20-0.9 MG/50ML-% IV SOLN
INTRAVENOUS | Status: AC
  Filled 2017-05-23: qty 50

## 2017-05-23 MED ORDER — PEGFILGRASTIM 6 MG/0.6ML ~~LOC~~ PSKT
6.0000 mg | PREFILLED_SYRINGE | Freq: Once | SUBCUTANEOUS | Status: AC
  Administered 2017-05-23: 6 mg via SUBCUTANEOUS

## 2017-05-23 MED ORDER — SODIUM CHLORIDE 0.9 % IV SOLN
Freq: Once | INTRAVENOUS | Status: AC
  Administered 2017-05-23: 10:00:00 via INTRAVENOUS

## 2017-05-23 MED ORDER — DIPHENHYDRAMINE HCL 50 MG/ML IJ SOLN
50.0000 mg | Freq: Once | INTRAMUSCULAR | Status: AC
  Administered 2017-05-23: 50 mg via INTRAVENOUS

## 2017-05-23 MED ORDER — FAMOTIDINE IN NACL 20-0.9 MG/50ML-% IV SOLN
20.0000 mg | Freq: Once | INTRAVENOUS | Status: AC
  Administered 2017-05-23: 20 mg via INTRAVENOUS

## 2017-05-23 MED ORDER — HEPARIN SOD (PORK) LOCK FLUSH 100 UNIT/ML IV SOLN
500.0000 [IU] | Freq: Once | INTRAVENOUS | Status: AC | PRN
  Administered 2017-05-23: 500 [IU]
  Filled 2017-05-23: qty 5

## 2017-05-23 NOTE — Patient Instructions (Signed)
Implanted Port Home Guide An implanted port is a type of central line that is placed under the skin. Central lines are used to provide IV access when treatment or nutrition needs to be given through a person's veins. Implanted ports are used for long-term IV access. An implanted port may be placed because:  You need IV medicine that would be irritating to the small veins in your hands or arms.  You need long-term IV medicines, such as antibiotics.  You need IV nutrition for a long period.  You need frequent blood draws for lab tests.  You need dialysis.  Implanted ports are usually placed in the chest area, but they can also be placed in the upper arm, the abdomen, or the leg. An implanted port has two main parts:  Reservoir. The reservoir is round and will appear as a small, raised area under your skin. The reservoir is the part where a needle is inserted to give medicines or draw blood.  Catheter. The catheter is a thin, flexible tube that extends from the reservoir. The catheter is placed into a large vein. Medicine that is inserted into the reservoir goes into the catheter and then into the vein.  How will I care for my incision site? Do not get the incision site wet. Bathe or shower as directed by your health care provider. How is my port accessed? Special steps must be taken to access the port:  Before the port is accessed, a numbing cream can be placed on the skin. This helps numb the skin over the port site.  Your health care provider uses a sterile technique to access the port. ? Your health care provider must put on a mask and sterile gloves. ? The skin over your port is cleaned carefully with an antiseptic and allowed to dry. ? The port is gently pinched between sterile gloves, and a needle is inserted into the port.  Only "non-coring" port needles should be used to access the port. Once the port is accessed, a blood return should be checked. This helps ensure that the port  is in the vein and is not clogged.  If your port needs to remain accessed for a constant infusion, a clear (transparent) bandage will be placed over the needle site. The bandage and needle will need to be changed every week, or as directed by your health care provider.  Keep the bandage covering the needle clean and dry. Do not get it wet. Follow your health care provider's instructions on how to take a shower or bath while the port is accessed.  If your port does not need to stay accessed, no bandage is needed over the port.  What is flushing? Flushing helps keep the port from getting clogged. Follow your health care provider's instructions on how and when to flush the port. Ports are usually flushed with saline solution or a medicine called heparin. The need for flushing will depend on how the port is used.  If the port is used for intermittent medicines or blood draws, the port will need to be flushed: ? After medicines have been given. ? After blood has been drawn. ? As part of routine maintenance.  If a constant infusion is running, the port may not need to be flushed.  How long will my port stay implanted? The port can stay in for as long as your health care provider thinks it is needed. When it is time for the port to come out, surgery will be   done to remove it. The procedure is similar to the one performed when the port was put in. When should I seek immediate medical care? When you have an implanted port, you should seek immediate medical care if:  You notice a bad smell coming from the incision site.  You have swelling, redness, or drainage at the incision site.  You have more swelling or pain at the port site or the surrounding area.  You have a fever that is not controlled with medicine.  This information is not intended to replace advice given to you by your health care provider. Make sure you discuss any questions you have with your health care provider. Document  Released: 05/27/2005 Document Revised: 11/02/2015 Document Reviewed: 02/01/2013 Elsevier Interactive Patient Education  2017 [employer]  

## 2017-05-23 NOTE — Patient Instructions (Signed)
[facility] Discharge Instructions for Patients Receiving Chemotherapy  Today you received the following chemotherapy agents Taxol and Carboplatin   To help prevent nausea and vomiting after your treatment, we encourage you to take your nausea medication as directed. No Zofran for 3 days, take Compazine instead.    If you develop nausea and vomiting that is not controlled by your nausea medication, call the clinic.   BELOW ARE SYMPTOMS THAT SHOULD BE REPORTED IMMEDIATELY:  *FEVER GREATER THAN 100.5 F  *CHILLS WITH OR WITHOUT FEVER  NAUSEA AND VOMITING THAT IS NOT CONTROLLED WITH YOUR NAUSEA MEDICATION  *UNUSUAL SHORTNESS OF BREATH  *UNUSUAL BRUISING OR BLEEDING  TENDERNESS IN MOUTH AND THROAT WITH OR WITHOUT PRESENCE OF ULCERS  *URINARY PROBLEMS  *BOWEL PROBLEMS  UNUSUAL RASH Items with * indicate a potential emergency and should be followed up as soon as possible.  Feel free to call the clinic should you have any questions or concerns. The clinic phone number is [phone].  Please show the CHEMO ALERT CARD at check-in to the Emergency Department and triage nurse.   

## 2017-05-23 NOTE — Progress Notes (Signed)
[place] OFFICE PROGRESS NOTE  Patient Care Team: Monico Blitz, MD as PCP - General (Internal Medicine)  SUMMARY OF ONCOLOGIC HISTORY:   Ovarian cancer on right [facility])   12/07/2016 Imaging    Ct angiogram:  No evidence of pulmonary embolus.  Large left and small right pleural effusions. Complete collapse of the left lower lobe, partial collapse of the lingula and milder atelectasis of the left upper lobe, likely due to compressive atelectasis. The collapsed lung parenchyma is poorly evaluated, but no obvious pulmonary masses are seen.  Interstitial pulmonary edema.  Small volume abdominal ascites.  Heterogeneous appearance of the thyroid gland, with right sided nodules.  Aortic Atherosclerosis (ICD10-I70.0).      12/07/2016 - 12/13/2016 Hospital Admission    She was admitted for generalized malaise, abnormal blood work, large bilateral pleural effusion and possible right ovarian cancer      12/08/2016 Imaging    LV EF: 65% -   70%      12/08/2016 Pathology Results    PLEURAL FLUID, LEFT (SPECIMEN 1 OF 1, COLLECTED ON 12/08/16): - MALIGNANT CELLS CONSISTENT WITH METASTATIC ADENOCARCINOMA, SEE COMMENT      12/08/2016 Procedure    Successful ultrasound guided left thoracentesis yielding 1.6 L of pleural fluid. The procedure was terminated early secondary to significant coughing.      12/12/2016 Imaging    CT scan of abdomen and pelvis 1. Septated right adnexal cyst measuring 8.5 x 6.5 cm, concerning for ovarian malignancy in this setting. Small volume complex intra-abdominal ascites which tracks in the pericolic gutters and anterior omentum. No definite soft tissue omental caking. 2. Endometrial thickening versus fluid in the endometrial canal, nonspecific. Rounded 3.9 cm lesion in the uterine fundus may be a fibroid, however would be unusual for patient's age. Endometrial/ uterine neoplasm is considered. 3. Nonspecific ill-defined low-density lesion in the right lobe  of the liver. Probable hepatic hemangioma at the dome. 4. Further evaluation of the GYN structures could be performed with ultrasound. MRI may provide further detail if needed for potential surgical planning. 5. Aortic atherosclerosis      12/12/2016 Imaging    CXR 1. Progressive left base atelectasis and infiltrate. Small left pleural effusion again noted. 2.  Cardiomegaly, no evidence of pulmonary venous congestion.      12/13/2016 Tumor Marker    Patient's tumor was tested for the following markers: CA-125. Results of the tumor marker test revealed 1310.      Apr 14, 202018 Procedure    Ultrasound and fluoroscopically guided right internal jugular single lumen power port catheter insertion. Tip in the SVC/RA junction. Catheter ready for use.      01/01/2017 -  Chemotherapy    She received carboplatin and Taxol x 5 cycles, interrupted in November for interval debulking surgery, and resumed to receive 3 more cycles from December to January 2019      01/07/2017 - 01/11/2017 Hospital Admission    She was admitted for severe nausea/vomiting and pleural effusion.      01/08/2017 Procedure    Successful ultrasound guided left thoracentesis yielding 1750 mL of pleural fluid      01/08/2017 Imaging    Marked improvement in left pleural effusion following thoracentesis. Negative for pneumothorax      01/22/2017 Adverse Reaction    Second dose of chemo requires significant dose adjustment and GCSF support      02/13/2017 Tumor Marker    Patient's tumor was tested for the following markers: CA-125. Results of the tumor  marker test revealed 310.6      03/05/2017 Imaging    1. Persistent mild ventral peritoneal / omental nodularity in the LEFT abdomen concerning for peritoneal carcinomatosis. 2. Interval reduction in free fluid the abdomen pelvis. 3. Slight decrease in volume of large RIGHT adnexal cystic mass. 4. Stable LEFT pleural effusion.      03/06/2017 Tumor Marker    Patient's tumor was  tested for the following markers: CA-125. Results of the tumor marker test revealed 128.1      03/27/2017 Tumor Marker    Patient's tumor was tested for the following markers: CA-125. Results of the tumor marker test revealed 96.5      04/22/2017 Pathology Results    1. Uterus +/- tubes/ovaries, neoplastic - RIGHT OVARY: HIGH GRADE CARCINOMA, SPANNING 7 CM, SEE COMMENT. PSAMMOMATOUS DEPOSITS ON SURFACE. SEE ONCOLOGY TABLE. - LEFT MESO-OVARIUM: METASTATIC HIGH GRADE CARCINOMA. DEFINITIVE OVARIAN TISSUE NOT IDENTIFIED. - UTERUS: -ENDOMETRIUM: BENIGN ENDOMETRIAL TYPE POLYP. NO MALIGNANCY IDENTIFIED. -MYOMETRIUM: -SEROSA: PSAMMOMATOUS DEPOSITS. NO VIABLE TUMOR IDENTIFIED. - CERVIX: NO DYSPLASIA OR MALIGNANCY. - BILATERAL FALLOPIAN TUBES: RIGHT: METASTATIC HIGH GRADE CARCINOMA. PARATUBAL CYSTS. LEFT: PSAMMOMATOUS DEPOSITS. 2. Omentum, biopsy - METASTATIC HIGH GRADE CARCINOMA. Microscopic Comment 1. OVARY Specimen(s): Uterus, cervix, bilateral fallopian tubes, right ovary, omentum. Procedure: (including lymph node sampling): Total hysterectomy with bilateral salpingo-oophorectomy and omental biopsy. Primary tumor site (including laterality): Right ovary. Ovarian surface involvement: Present. Ovarian capsule intact without fragmentation: Yes. Maximum tumor size (cm): 7 cm. Histologic type: High grade carcinoma, see comment. Grade: High grade. Peritoneal implants: (specify invasive or non-invasive): Present, invasive. Pelvic extension (list additional structures on separate lines and if involved): Right fallopian tube, left meso-ovarium, omentum (>2 cm deposit). Lymph nodes: number examined 0 ; number positive 0  TNM code: pT3c, pN0, pMX FIGO Stage (based on pathologic findings, needs clinical correlation): IIIC Comments: The tumor consists of large papillary type excrescences with high grade tumor. There are clear cell features and the tumor is negative for WT-1 and p53, thus the  tumor is favored to be a clear cell carcinoma or at least have had a component of clear cell carcinoma (residual after treatment). Dr. Lyndon Code has reviewed select slides      04/22/2017 Surgery    Pre-operative Diagnosis: stage IV ovarian cancer, s/p neoadjuvant chemotherapy  Post-operative Diagnosis: same  Operation: Robotic-assisted laparoscopic total hysterectomy with bilateral salpingoophorectomy, ex lap, omentectomy, mobilzation of splenic flexure, radical tumor debulking.  Surgeon: Donaciano Eva  Assistant Surgeon: Lahoma Crocker MD  Operative Findings:  : 7cm right ovarian cyst,  4cm tumor knot in left upper quadrant adherent to the splenic flexure of the colon and stomach.  No gross residual disease at the end of the procedure representing an optimal cytoreduction.         INTERVAL HISTORY: Please see below for problem oriented charting. She returns with her daughter to resume adjuvant treatment She tolerated recent surgery well She denies recent changes in bowel habits No recent infection  REVIEW OF SYSTEMS:   Constitutional: Denies fevers, chills or abnormal weight loss Eyes: Denies blurriness of vision Ears, nose, mouth, throat, and face: Denies mucositis or sore throat Respiratory: Denies cough, dyspnea or wheezes Cardiovascular: Denies palpitation, chest discomfort or lower extremity swelling Gastrointestinal:  Denies nausea, heartburn or change in bowel habits Skin: Denies abnormal skin rashes Lymphatics: Denies new lymphadenopathy or easy bruising Neurological:Denies numbness, tingling or new weaknesses Behavioral/Psych: Mood is stable, no new changes  All other systems were reviewed with the  patient and are negative.  I have reviewed the past medical history, past surgical history, social history and family history with the patient and they are unchanged from previous note.  ALLERGIES:  is allergic to tetanus toxoids and  codeine.  MEDICATIONS:  Current Outpatient Medications  Medication Sig Dispense Refill  . bisoprolol (ZEBETA) 10 MG tablet Take 10 mg by mouth daily.    Marland Kitchen acetaminophen (TYLENOL) 500 MG tablet Take 500 mg by mouth every 8 (eight) hours as needed for mild pain or headache.    Marland Kitchen aspirin EC 81 MG tablet Take 81 mg by mouth daily.    . cephALEXin (KEFLEX) 500 MG capsule Take 1 capsule (500 mg total) by mouth 2 (two) times daily. 28 capsule 0  . Cholecalciferol (VITAMIN D) 2000 units tablet Take 2,000 Units by mouth daily.    Marland Kitchen dexamethasone (DECADRON) 4 MG tablet 5 tabs the night before and 5 tabs in the morning of chemo, take with food, every 3 weeks 30 tablet 1  . furosemide (LASIX) 20 MG tablet Take 1 tablet (20 mg total) by mouth daily. 30 tablet 0  . Krill Oil 500 MG CAPS Take 500 mg by mouth daily.    Marland Kitchen lidocaine-prilocaine (EMLA) cream Apply to affected area once (Patient taking differently: Apply 1 application topically See admin instructions. Apply to affected area once) 30 g 3  . loratadine (CLARITIN) 10 MG tablet Take 10 mg by mouth daily as needed (before and after chemo treatment).    . Multiple Vitamins-Minerals (MULTI FOR HER 50+) TABS Take 1 tablet by mouth daily.    . ondansetron (ZOFRAN) 8 MG tablet Take three times a day as needed (Patient taking differently: Take 8 mg by mouth every 8 (eight) hours as needed for nausea or vomiting. Take three times a day as needed) 30 tablet 1  . prochlorperazine (COMPAZINE) 10 MG tablet Take 1 tablet (10 mg total) by mouth every 6 (six) hours as needed (Nausea or vomiting). 30 tablet 1  . simvastatin (ZOCOR) 20 MG tablet Take 20 mg by mouth daily at 6 PM.     . traMADol (ULTRAM) 50 MG tablet Take 1 tablet (50 mg total) every 6 (six) hours as needed by mouth. 10 tablet 0   No current facility-administered medications for this visit.    Facility-Administered Medications Ordered in Other Visits  Medication Dose Route Frequency Provider Last Rate  Last Dose  . sodium chloride flush (NS) 0.9 % injection 10 mL  10 mL Intracatheter PRN Alvy Bimler, Kelani Robart, MD   10 mL at 05/23/17 1523    PHYSICAL EXAMINATION: ECOG PERFORMANCE STATUS: 1 - Symptomatic but completely ambulatory  Vitals:   05/23/17 0833  BP: (!) 133/58  Pulse: 72  Resp: 17  Temp: 97.6 F (36.4 C)  SpO2: 100%   Filed Weights   05/23/17 0833  Weight: 136 lb (61.7 kg)    GENERAL:alert, no distress and comfortable SKIN: skin color, texture, turgor are normal, no rashes or significant lesions EYES: normal, Conjunctiva are pink and non-injected, sclera clear OROPHARYNX:no exudate, no erythema and lips, buccal mucosa, and tongue normal  NECK: supple, thyroid normal size, non-tender, without nodularity LYMPH:  no palpable lymphadenopathy in the cervical, axillary or inguinal LUNGS: clear to auscultation and percussion with normal breathing effort HEART: regular rate & rhythm and no murmurs and no lower extremity edema ABDOMEN:abdomen soft, non-tender and normal bowel sounds Musculoskeletal:no cyanosis of digits and no clubbing  NEURO: alert & oriented x 3 with  fluent speech, no focal motor/sensory deficits  LABORATORY DATA:  I have reviewed the data as listed    Component Value Date/Time   NA 137 05/23/2017 0758   K 3.6 05/23/2017 0758   CL 103 04/23/2017 0541   CO2 22 05/23/2017 0758   GLUCOSE 191 (H) 05/23/2017 0758   BUN 18.5 05/23/2017 0758   CREATININE 0.9 05/23/2017 0758   CALCIUM 9.8 05/23/2017 0758   PROT 6.8 05/23/2017 0758   ALBUMIN 3.9 05/23/2017 0758   AST 19 05/23/2017 0758   ALT 16 05/23/2017 0758   ALKPHOS 71 05/23/2017 0758   BILITOT 0.63 05/23/2017 0758   GFRNONAA 50 (L) 04/23/2017 0541   GFRAA 58 (L) 04/23/2017 0541    No results found for: SPEP, UPEP  Lab Results  Component Value Date   WBC 3.8 (L) 05/23/2017   NEUTROABS 2.8 05/23/2017   HGB 11.3 (L) 05/23/2017   HCT 33.3 (L) 05/23/2017   MCV 98.5 05/23/2017   PLT 167 05/23/2017       Chemistry      Component Value Date/Time   NA 137 05/23/2017 0758   K 3.6 05/23/2017 0758   CL 103 04/23/2017 0541   CO2 22 05/23/2017 0758   BUN 18.5 05/23/2017 0758   CREATININE 0.9 05/23/2017 0758      Component Value Date/Time   CALCIUM 9.8 05/23/2017 0758   ALKPHOS 71 05/23/2017 0758   AST 19 05/23/2017 0758   ALT 16 05/23/2017 0758   BILITOT 0.63 05/23/2017 0758      ASSESSMENT & PLAN:  Ovarian cancer on right ([place]) We will resume chemotherapy with carboplatin and Taxol for 3 more cycles I would refer her to see genetic counselor for genetic testing   Acquired pancytopenia ([place]) This is secondary to side effects of recent surgery She is not symptomatic Will observe only   Orders Placed This Encounter  Procedures  . Ambulatory referral to Genetics    Referral Priority:   Routine    Referral Type:   Consultation    Referral Reason:   Specialty Services Required    Number of Visits Requested:   1   All questions were answered. The patient knows to call the clinic with any problems, questions or concerns. No barriers to learning was detected. I spent 15 minutes counseling the patient face to face. The total time spent in the appointment was 20 minutes and more than 50% was on counseling and review of test results     Heath Lark, MD 05/23/2017 5:26 PM

## 2017-05-23 NOTE — Assessment & Plan Note (Signed)
We will resume chemotherapy with carboplatin and Taxol for 3 more cycles I would refer her to see genetic counselor for genetic testing

## 2017-05-23 NOTE — Telephone Encounter (Signed)
Scheduled appt per 12/14 los - patient did not want print out - my chart active.

## 2017-05-23 NOTE — Assessment & Plan Note (Signed)
This is secondary to side effects of recent surgery She is not symptomatic Will observe only

## 2017-05-24 ENCOUNTER — Telehealth: Payer: Self-pay | Admitting: Genetic Counselor

## 2017-05-24 LAB — CA 125: Cancer Antigen (CA) 125: 69.4 U/mL — ABNORMAL HIGH (ref 0.0–38.1)

## 2017-05-24 NOTE — Telephone Encounter (Signed)
Spoke with patients daughter Shirlean Mylar about upcoming genetics appointment

## 2017-06-12 ENCOUNTER — Other Ambulatory Visit: Payer: Medicare Other

## 2017-06-12 ENCOUNTER — Encounter: Payer: Medicare Other | Admitting: Genetic Counselor

## 2017-06-13 ENCOUNTER — Ambulatory Visit (HOSPITAL_BASED_OUTPATIENT_CLINIC_OR_DEPARTMENT_OTHER): Payer: Medicare Other

## 2017-06-13 ENCOUNTER — Telehealth: Payer: Self-pay | Admitting: Hematology and Oncology

## 2017-06-13 ENCOUNTER — Other Ambulatory Visit (HOSPITAL_BASED_OUTPATIENT_CLINIC_OR_DEPARTMENT_OTHER): Payer: Medicare Other

## 2017-06-13 ENCOUNTER — Encounter: Payer: Self-pay | Admitting: Hematology and Oncology

## 2017-06-13 ENCOUNTER — Ambulatory Visit (HOSPITAL_BASED_OUTPATIENT_CLINIC_OR_DEPARTMENT_OTHER): Payer: Medicare Other | Admitting: Hematology and Oncology

## 2017-06-13 ENCOUNTER — Ambulatory Visit: Payer: Medicare Other

## 2017-06-13 DIAGNOSIS — Z5111 Encounter for antineoplastic chemotherapy: Secondary | ICD-10-CM | POA: Diagnosis not present

## 2017-06-13 DIAGNOSIS — C561 Malignant neoplasm of right ovary: Secondary | ICD-10-CM

## 2017-06-13 DIAGNOSIS — D6481 Anemia due to antineoplastic chemotherapy: Secondary | ICD-10-CM | POA: Diagnosis not present

## 2017-06-13 DIAGNOSIS — K5909 Other constipation: Secondary | ICD-10-CM | POA: Diagnosis not present

## 2017-06-13 DIAGNOSIS — Z95828 Presence of other vascular implants and grafts: Secondary | ICD-10-CM

## 2017-06-13 DIAGNOSIS — T451X5A Adverse effect of antineoplastic and immunosuppressive drugs, initial encounter: Secondary | ICD-10-CM

## 2017-06-13 LAB — COMPREHENSIVE METABOLIC PANEL
ALBUMIN: 3.6 g/dL (ref 3.5–5.0)
ALK PHOS: 125 U/L (ref 40–150)
ALT: 33 U/L (ref 0–55)
ANION GAP: 9 meq/L (ref 3–11)
AST: 25 U/L (ref 5–34)
BUN: 21 mg/dL (ref 7.0–26.0)
CALCIUM: 9.7 mg/dL (ref 8.4–10.4)
CHLORIDE: 104 meq/L (ref 98–109)
CO2: 24 mEq/L (ref 22–29)
CREATININE: 0.8 mg/dL (ref 0.6–1.1)
EGFR: >60 mL/min/{1.73_m2} (ref >60)
Glucose: 163 mg/dl — ABNORMAL HIGH (ref 70–140)
POTASSIUM: 3.9 meq/L (ref 3.5–5.1)
Sodium: 137 mEq/L (ref 136–145)
Total Bilirubin: 0.32 mg/dL (ref 0.20–1.20)
Total Protein: 6.7 g/dL (ref 6.4–8.3)

## 2017-06-13 LAB — CBC WITH DIFFERENTIAL/PLATELET
BASO%: 0.2 % (ref 0.0–2.0)
Basophils Absolute: 0 10*3/uL (ref 0.0–0.1)
EOS%: 0 % (ref 0.0–7.0)
Eosinophils Absolute: 0 10*3/uL (ref 0.0–0.5)
HCT: 30.9 % — ABNORMAL LOW (ref 34.8–46.6)
HEMOGLOBIN: 10.5 g/dL — AB (ref 11.6–15.9)
LYMPH%: 13.5 % — AB (ref 14.0–49.7)
MCH: 33.9 pg (ref 25.1–34.0)
MCHC: 34 g/dL (ref 31.5–36.0)
MCV: 99.7 fL (ref 79.5–101.0)
MONO#: 0.2 10*3/uL (ref 0.1–0.9)
MONO%: 1.3 % (ref 0.0–14.0)
NEUT%: 85 % — ABNORMAL HIGH (ref 38.4–76.8)
NEUTROS ABS: 10.3 10*3/uL — AB (ref 1.5–6.5)
Platelets: 232 10*3/uL (ref 145–400)
RBC: 3.1 10*6/uL — ABNORMAL LOW (ref 3.70–5.45)
RDW: 13.9 % (ref 11.2–14.5)
WBC: 12.1 10*3/uL — AB (ref 3.9–10.3)
lymph#: 1.6 10*3/uL (ref 0.9–3.3)

## 2017-06-13 MED ORDER — SODIUM CHLORIDE 0.9 % IV SOLN
Freq: Once | INTRAVENOUS | Status: AC
  Administered 2017-06-13: 12:00:00 via INTRAVENOUS
  Filled 2017-06-13: qty 5

## 2017-06-13 MED ORDER — FAMOTIDINE IN NACL 20-0.9 MG/50ML-% IV SOLN
20.0000 mg | Freq: Once | INTRAVENOUS | Status: AC
  Administered 2017-06-13: 20 mg via INTRAVENOUS

## 2017-06-13 MED ORDER — SODIUM CHLORIDE 0.9% FLUSH
10.0000 mL | Freq: Once | INTRAVENOUS | Status: AC
  Administered 2017-06-13: 10 mL
  Filled 2017-06-13: qty 10

## 2017-06-13 MED ORDER — PALONOSETRON HCL INJECTION 0.25 MG/5ML
INTRAVENOUS | Status: AC
  Filled 2017-06-13: qty 5

## 2017-06-13 MED ORDER — SODIUM CHLORIDE 0.9 % IV SOLN
317.7000 mg | Freq: Once | INTRAVENOUS | Status: AC
  Administered 2017-06-13: 320 mg via INTRAVENOUS
  Filled 2017-06-13: qty 32

## 2017-06-13 MED ORDER — HEPARIN SOD (PORK) LOCK FLUSH 100 UNIT/ML IV SOLN
500.0000 [IU] | Freq: Once | INTRAVENOUS | Status: AC | PRN
  Administered 2017-06-13: 500 [IU]
  Filled 2017-06-13: qty 5

## 2017-06-13 MED ORDER — DIPHENHYDRAMINE HCL 50 MG/ML IJ SOLN
INTRAMUSCULAR | Status: AC
Start: 2017-06-13 — End: 2017-06-13
  Filled 2017-06-13: qty 1

## 2017-06-13 MED ORDER — PEGFILGRASTIM 6 MG/0.6ML ~~LOC~~ PSKT
6.0000 mg | PREFILLED_SYRINGE | Freq: Once | SUBCUTANEOUS | Status: AC
  Administered 2017-06-13: 6 mg via SUBCUTANEOUS

## 2017-06-13 MED ORDER — DIPHENHYDRAMINE HCL 50 MG/ML IJ SOLN
50.0000 mg | Freq: Once | INTRAMUSCULAR | Status: AC
  Administered 2017-06-13: 50 mg via INTRAVENOUS

## 2017-06-13 MED ORDER — PALONOSETRON HCL INJECTION 0.25 MG/5ML
0.2500 mg | Freq: Once | INTRAVENOUS | Status: AC
  Administered 2017-06-13: 0.25 mg via INTRAVENOUS

## 2017-06-13 MED ORDER — PEGFILGRASTIM 6 MG/0.6ML ~~LOC~~ PSKT
PREFILLED_SYRINGE | SUBCUTANEOUS | Status: AC
  Filled 2017-06-13: qty 0.6

## 2017-06-13 MED ORDER — SODIUM CHLORIDE 0.9% FLUSH
10.0000 mL | INTRAVENOUS | Status: DC | PRN
Start: 2017-06-13 — End: 2017-06-13
  Administered 2017-06-13: 10 mL
  Filled 2017-06-13: qty 10

## 2017-06-13 MED ORDER — SODIUM CHLORIDE 0.9 % IV SOLN
131.2500 mg/m2 | Freq: Once | INTRAVENOUS | Status: AC
  Administered 2017-06-13: 228 mg via INTRAVENOUS
  Filled 2017-06-13: qty 38

## 2017-06-13 MED ORDER — SODIUM CHLORIDE 0.9 % IV SOLN
Freq: Once | INTRAVENOUS | Status: AC
  Administered 2017-06-13: 11:00:00 via INTRAVENOUS

## 2017-06-13 MED ORDER — FAMOTIDINE IN NACL 20-0.9 MG/50ML-% IV SOLN
INTRAVENOUS | Status: AC
  Filled 2017-06-13: qty 50

## 2017-06-13 NOTE — Assessment & Plan Note (Signed)
This is likely due to recent treatment. The patient denies recent history of bleeding such as epistaxis, hematuria or hematochezia. She is asymptomatic from the anemia. I will observe for now.   

## 2017-06-13 NOTE — Progress Notes (Signed)
[place] OFFICE PROGRESS NOTE  Patient Care Team: Monico Blitz, MD as PCP - General (Internal Medicine)  SUMMARY OF ONCOLOGIC HISTORY:   Ovarian cancer on right [facility])   12/07/2016 Imaging    Ct angiogram:  No evidence of pulmonary embolus.  Large left and small right pleural effusions. Complete collapse of the left lower lobe, partial collapse of the lingula and milder atelectasis of the left upper lobe, likely due to compressive atelectasis. The collapsed lung parenchyma is poorly evaluated, but no obvious pulmonary masses are seen.  Interstitial pulmonary edema.  Small volume abdominal ascites.  Heterogeneous appearance of the thyroid gland, with right sided nodules.  Aortic Atherosclerosis (ICD10-I70.0).      12/07/2016 - 12/13/2016 Hospital Admission    She was admitted for generalized malaise, abnormal blood work, large bilateral pleural effusion and possible right ovarian cancer      12/08/2016 Imaging    LV EF: 65% -   70%      12/08/2016 Pathology Results    PLEURAL FLUID, LEFT (SPECIMEN 1 OF 1, COLLECTED ON 12/08/16): - MALIGNANT CELLS CONSISTENT WITH METASTATIC ADENOCARCINOMA, SEE COMMENT      12/08/2016 Procedure    Successful ultrasound guided left thoracentesis yielding 1.6 L of pleural fluid. The procedure was terminated early secondary to significant coughing.      12/12/2016 Imaging    CT scan of abdomen and pelvis 1. Septated right adnexal cyst measuring 8.5 x 6.5 cm, concerning for ovarian malignancy in this setting. Small volume complex intra-abdominal ascites which tracks in the pericolic gutters and anterior omentum. No definite soft tissue omental caking. 2. Endometrial thickening versus fluid in the endometrial canal, nonspecific. Rounded 3.9 cm lesion in the uterine fundus may be a fibroid, however would be unusual for patient's age. Endometrial/ uterine neoplasm is considered. 3. Nonspecific ill-defined low-density lesion in the right lobe  of the liver. Probable hepatic hemangioma at the dome. 4. Further evaluation of the GYN structures could be performed with ultrasound. MRI may provide further detail if needed for potential surgical planning. 5. Aortic atherosclerosis      12/12/2016 Imaging    CXR 1. Progressive left base atelectasis and infiltrate. Small left pleural effusion again noted. 2.  Cardiomegaly, no evidence of pulmonary venous congestion.      12/13/2016 Tumor Marker    Patient's tumor was tested for the following markers: CA-125. Results of the tumor marker test revealed 1310.      Apr 14, 202018 Procedure    Ultrasound and fluoroscopically guided right internal jugular single lumen power port catheter insertion. Tip in the SVC/RA junction. Catheter ready for use.      01/01/2017 -  Chemotherapy    She received carboplatin and Taxol x 5 cycles, interrupted in November for interval debulking surgery, and resumed to receive 3 more cycles from December to January 2019      01/07/2017 - 01/11/2017 Hospital Admission    She was admitted for severe nausea/vomiting and pleural effusion.      01/08/2017 Procedure    Successful ultrasound guided left thoracentesis yielding 1750 mL of pleural fluid      01/08/2017 Imaging    Marked improvement in left pleural effusion following thoracentesis. Negative for pneumothorax      01/22/2017 Adverse Reaction    Second dose of chemo requires significant dose adjustment and GCSF support      02/13/2017 Tumor Marker    Patient's tumor was tested for the following markers: CA-125. Results of the tumor  marker test revealed 310.6      03/05/2017 Imaging    1. Persistent mild ventral peritoneal / omental nodularity in the LEFT abdomen concerning for peritoneal carcinomatosis. 2. Interval reduction in free fluid the abdomen pelvis. 3. Slight decrease in volume of large RIGHT adnexal cystic mass. 4. Stable LEFT pleural effusion.      03/06/2017 Tumor Marker    Patient's tumor was  tested for the following markers: CA-125. Results of the tumor marker test revealed 128.1      03/27/2017 Tumor Marker    Patient's tumor was tested for the following markers: CA-125. Results of the tumor marker test revealed 96.5      04/22/2017 Pathology Results    1. Uterus +/- tubes/ovaries, neoplastic - RIGHT OVARY: HIGH GRADE CARCINOMA, SPANNING 7 CM, SEE COMMENT. PSAMMOMATOUS DEPOSITS ON SURFACE. SEE ONCOLOGY TABLE. - LEFT MESO-OVARIUM: METASTATIC HIGH GRADE CARCINOMA. DEFINITIVE OVARIAN TISSUE NOT IDENTIFIED. - UTERUS: -ENDOMETRIUM: BENIGN ENDOMETRIAL TYPE POLYP. NO MALIGNANCY IDENTIFIED. -MYOMETRIUM: -SEROSA: PSAMMOMATOUS DEPOSITS. NO VIABLE TUMOR IDENTIFIED. - CERVIX: NO DYSPLASIA OR MALIGNANCY. - BILATERAL FALLOPIAN TUBES: RIGHT: METASTATIC HIGH GRADE CARCINOMA. PARATUBAL CYSTS. LEFT: PSAMMOMATOUS DEPOSITS. 2. Omentum, biopsy - METASTATIC HIGH GRADE CARCINOMA. Microscopic Comment 1. OVARY Specimen(s): Uterus, cervix, bilateral fallopian tubes, right ovary, omentum. Procedure: (including lymph node sampling): Total hysterectomy with bilateral salpingo-oophorectomy and omental biopsy. Primary tumor site (including laterality): Right ovary. Ovarian surface involvement: Present. Ovarian capsule intact without fragmentation: Yes. Maximum tumor size (cm): 7 cm. Histologic type: High grade carcinoma, see comment. Grade: High grade. Peritoneal implants: (specify invasive or non-invasive): Present, invasive. Pelvic extension (list additional structures on separate lines and if involved): Right fallopian tube, left meso-ovarium, omentum (>2 cm deposit). Lymph nodes: number examined 0 ; number positive 0  TNM code: pT3c, pN0, pMX FIGO Stage (based on pathologic findings, needs clinical correlation): IIIC Comments: The tumor consists of large papillary type excrescences with high grade tumor. There are clear cell features and the tumor is negative for WT-1 and p53, thus the  tumor is favored to be a clear cell carcinoma or at least have had a component of clear cell carcinoma (residual after treatment). Dr. Lyndon Code has reviewed select slides      04/22/2017 Surgery    Pre-operative Diagnosis: stage IV ovarian cancer, s/p neoadjuvant chemotherapy  Post-operative Diagnosis: same  Operation: Robotic-assisted laparoscopic total hysterectomy with bilateral salpingoophorectomy, ex lap, omentectomy, mobilzation of splenic flexure, radical tumor debulking.  Surgeon: Donaciano Eva  Assistant Surgeon: Lahoma Crocker MD  Operative Findings:  : 7cm right ovarian cyst,  4cm tumor knot in left upper quadrant adherent to the splenic flexure of the colon and stomach.  No gross residual disease at the end of the procedure representing an optimal cytoreduction.        05/23/2017 Tumor Marker    Patient's tumor was tested for the following markers: CA-125. Results of the tumor marker test revealed 69.4       INTERVAL HISTORY: Please see below for problem oriented charting. She returns for further follow-up, prior to second dose of chemotherapy She tolerated last treatment well except for constipation which is chronic No nausea She denies recent peripheral neuropathy No recent infection  REVIEW OF SYSTEMS:   Constitutional: Denies fevers, chills or abnormal weight loss Eyes: Denies blurriness of vision Ears, nose, mouth, throat, and face: Denies mucositis or sore throat Respiratory: Denies cough, dyspnea or wheezes Cardiovascular: Denies palpitation, chest discomfort or lower extremity swelling Skin: Denies abnormal skin rashes Lymphatics: Denies  new lymphadenopathy or easy bruising Neurological:Denies numbness, tingling or new weaknesses Behavioral/Psych: Mood is stable, no new changes  All other systems were reviewed with the patient and are negative.  I have reviewed the past medical history, past surgical history, social history and family  history with the patient and they are unchanged from previous note.  ALLERGIES:  is allergic to tetanus toxoids and codeine.  MEDICATIONS:  Current Outpatient Medications  Medication Sig Dispense Refill  . acetaminophen (TYLENOL) 500 MG tablet Take 500 mg by mouth every 8 (eight) hours as needed for mild pain or headache.    Marland Kitchen aspirin EC 81 MG tablet Take 81 mg by mouth daily.    . bisoprolol (ZEBETA) 10 MG tablet Take 10 mg by mouth daily.    . cephALEXin (KEFLEX) 500 MG capsule Take 1 capsule (500 mg total) by mouth 2 (two) times daily. 28 capsule 0  . Cholecalciferol (VITAMIN D) 2000 units tablet Take 2,000 Units by mouth daily.    Marland Kitchen dexamethasone (DECADRON) 4 MG tablet 5 tabs the night before and 5 tabs in the morning of chemo, take with food, every 3 weeks 30 tablet 1  . furosemide (LASIX) 20 MG tablet Take 1 tablet (20 mg total) by mouth daily. 30 tablet 0  . Krill Oil 500 MG CAPS Take 500 mg by mouth daily.    Marland Kitchen lidocaine-prilocaine (EMLA) cream Apply to affected area once (Patient taking differently: Apply 1 application topically See admin instructions. Apply to affected area once) 30 g 3  . loratadine (CLARITIN) 10 MG tablet Take 10 mg by mouth daily as needed (before and after chemo treatment).    . Multiple Vitamins-Minerals (MULTI FOR HER 50+) TABS Take 1 tablet by mouth daily.    . ondansetron (ZOFRAN) 8 MG tablet Take three times a day as needed (Patient taking differently: Take 8 mg by mouth every 8 (eight) hours as needed for nausea or vomiting. Take three times a day as needed) 30 tablet 1  . prochlorperazine (COMPAZINE) 10 MG tablet Take 1 tablet (10 mg total) by mouth every 6 (six) hours as needed (Nausea or vomiting). 30 tablet 1  . simvastatin (ZOCOR) 20 MG tablet Take 20 mg by mouth daily at 6 PM.     . traMADol (ULTRAM) 50 MG tablet Take 1 tablet (50 mg total) every 6 (six) hours as needed by mouth. 10 tablet 0   No current facility-administered medications for this  visit.    Facility-Administered Medications Ordered in Other Visits  Medication Dose Route Frequency Provider Last Rate Last Dose  . 0.9 %  sodium chloride infusion   Intravenous Once Debar Plate, MD      . CARBOplatin (PARAPLATIN) 320 mg in sodium chloride 0.9 % 100 mL chemo infusion  320 mg Intravenous Once Alvy Bimler, Bronnie Vasseur, MD      . diphenhydrAMINE (BENADRYL) injection 50 mg  50 mg Intravenous Once Alvy Bimler, Ly Wass, MD      . famotidine (PEPCID) IVPB 20 mg premix  20 mg Intravenous Once Alvy Bimler, Lucas Winograd, MD      . fosaprepitant (EMEND) 150 mg, dexamethasone (DECADRON) 12 mg in sodium chloride 0.9 % 145 mL IVPB   Intravenous Once Alvy Bimler, Dalila Arca, MD      . heparin lock flush 100 unit/mL  500 Units Intracatheter Once PRN Alvy Bimler, Jonta Gastineau, MD      . PACLitaxel (TAXOL) 228 mg in dextrose 5 % 250 mL chemo infusion (> '80mg'$ /m2)  131.25 mg/m2 (Treatment Plan Recorded) Intravenous Once  Heath Lark, MD      . palonosetron (ALOXI) injection 0.25 mg  0.25 mg Intravenous Once Uchenna Seufert, MD      . pegfilgrastim (NEULASTA ONPRO KIT) injection 6 mg  6 mg Subcutaneous Once Addis Tuohy, MD      . sodium chloride flush (NS) 0.9 % injection 10 mL  10 mL Intracatheter PRN Alvy Bimler, Hanad Leino, MD        PHYSICAL EXAMINATION: ECOG PERFORMANCE STATUS: 1 - Symptomatic but completely ambulatory  Vitals:   06/13/17 0949  BP: (!) 150/78  Pulse: 70  Resp: 18  Temp: 97.9 F (36.6 C)  SpO2: 100%   Filed Weights   06/13/17 0949  Weight: 136 lb 1.6 oz (61.7 kg)    GENERAL:alert, no distress and comfortable SKIN: skin color, texture, turgor are normal, no rashes or significant lesions EYES: normal, Conjunctiva are pink and non-injected, sclera clear OROPHARYNX:no exudate, no erythema and lips, buccal mucosa, and tongue normal  NECK: supple, thyroid normal size, non-tender, without nodularity LYMPH:  no palpable lymphadenopathy in the cervical, axillary or inguinal LUNGS: clear to auscultation and percussion with normal breathing  effort HEART: regular rate & rhythm and no murmurs and no lower extremity edema ABDOMEN:abdomen soft, non-tender and normal bowel sounds Musculoskeletal:no cyanosis of digits and no clubbing  NEURO: alert & oriented x 3 with fluent speech, no focal motor/sensory deficits  LABORATORY DATA:  I have reviewed the data as listed    Component Value Date/Time   NA 137 06/13/2017 0925   K 3.9 06/13/2017 0925   CL 103 04/23/2017 0541   CO2 24 06/13/2017 0925   GLUCOSE 163 (H) 06/13/2017 0925   BUN 21.0 06/13/2017 0925   CREATININE 0.8 06/13/2017 0925   CALCIUM 9.7 06/13/2017 0925   PROT 6.7 06/13/2017 0925   ALBUMIN 3.6 06/13/2017 0925   AST 25 06/13/2017 0925   ALT 33 06/13/2017 0925   ALKPHOS 125 06/13/2017 0925   BILITOT 0.32 06/13/2017 0925   GFRNONAA 50 (L) 04/23/2017 0541   GFRAA 58 (L) 04/23/2017 0541    No results found for: SPEP, UPEP  Lab Results  Component Value Date   WBC 12.1 (H) 06/13/2017   NEUTROABS 10.3 (H) 06/13/2017   HGB 10.5 (L) 06/13/2017   HCT 30.9 (L) 06/13/2017   MCV 99.7 06/13/2017   PLT 232 06/13/2017      Chemistry      Component Value Date/Time   NA 137 06/13/2017 0925   K 3.9 06/13/2017 0925   CL 103 04/23/2017 0541   CO2 24 06/13/2017 0925   BUN 21.0 06/13/2017 0925   CREATININE 0.8 06/13/2017 0925      Component Value Date/Time   CALCIUM 9.7 06/13/2017 0925   ALKPHOS 125 06/13/2017 0925   AST 25 06/13/2017 0925   ALT 33 06/13/2017 0925   BILITOT 0.32 06/13/2017 0925      ASSESSMENT & PLAN:  Ovarian cancer on right ([place]) We will resume chemotherapy with carboplatin and Taxol for 2 more cycles Genetic counselor appointment is pending I will see her back prior to her last dose of treatment    Anemia due to antineoplastic chemotherapy This is likely due to recent treatment. The patient denies recent history of bleeding such as epistaxis, hematuria or hematochezia. She is asymptomatic from the anemia. I will observe for now.    Chronic constipation We discussed the importance of aggressive laxative therapy.   No orders of the defined types were placed in this encounter.  All questions were answered. The patient knows to call the clinic with any problems, questions or concerns. No barriers to learning was detected. I spent 15 minutes counseling the patient face to face. The total time spent in the appointment was 20 minutes and more than 50% was on counseling and review of test results     Heath Lark, MD 06/13/2017 10:56 AM

## 2017-06-13 NOTE — Telephone Encounter (Signed)
Gave patient AVs and calendar of upcoming January appointments.  °

## 2017-06-13 NOTE — Assessment & Plan Note (Signed)
We discussed the importance of aggressive laxative therapy 

## 2017-06-13 NOTE — Patient Instructions (Signed)
   [facility] Discharge Instructions for Patients Receiving Chemotherapy  Today you received the following chemotherapy agents Taxol and Carboplatin   To help prevent nausea and vomiting after your treatment, we encourage you to take your nausea medication as directed.    If you develop nausea and vomiting that is not controlled by your nausea medication, call the clinic.   BELOW ARE SYMPTOMS THAT SHOULD BE REPORTED IMMEDIATELY:  *FEVER GREATER THAN 100.5 F  *CHILLS WITH OR WITHOUT FEVER  NAUSEA AND VOMITING THAT IS NOT CONTROLLED WITH YOUR NAUSEA MEDICATION  *UNUSUAL SHORTNESS OF BREATH  *UNUSUAL BRUISING OR BLEEDING  TENDERNESS IN MOUTH AND THROAT WITH OR WITHOUT PRESENCE OF ULCERS  *URINARY PROBLEMS  *BOWEL PROBLEMS  UNUSUAL RASH Items with * indicate a potential emergency and should be followed up as soon as possible.  Feel free to call the clinic should you have any questions or concerns. The clinic phone number is [phone].  Please show the CHEMO ALERT CARD at check-in to the Emergency Department and triage nurse.   

## 2017-06-13 NOTE — Assessment & Plan Note (Signed)
We will resume chemotherapy with carboplatin and Taxol for 2 more cycles Genetic counselor appointment is pending I will see her back prior to her last dose of treatment

## 2017-06-14 LAB — CA 125: Cancer Antigen (CA) 125: 56 U/mL — ABNORMAL HIGH (ref 0.0–38.1)

## 2017-06-16 ENCOUNTER — Encounter: Payer: Medicare Other | Admitting: Genetic Counselor

## 2017-06-16 ENCOUNTER — Other Ambulatory Visit: Payer: Medicare Other

## 2017-06-26 ENCOUNTER — Other Ambulatory Visit: Payer: Medicare Other

## 2017-06-30 ENCOUNTER — Encounter (HOSPITAL_COMMUNITY): Payer: Self-pay

## 2017-07-03 ENCOUNTER — Inpatient Hospital Stay (HOSPITAL_BASED_OUTPATIENT_CLINIC_OR_DEPARTMENT_OTHER): Payer: Medicare Other | Admitting: Hematology and Oncology

## 2017-07-03 ENCOUNTER — Telehealth: Payer: Self-pay | Admitting: Hematology and Oncology

## 2017-07-03 ENCOUNTER — Inpatient Hospital Stay: Payer: Medicare Other

## 2017-07-03 ENCOUNTER — Inpatient Hospital Stay: Payer: Medicare Other | Attending: Hematology and Oncology

## 2017-07-03 ENCOUNTER — Encounter: Payer: Self-pay | Admitting: Genetic Counselor

## 2017-07-03 ENCOUNTER — Encounter: Payer: Self-pay | Admitting: Hematology and Oncology

## 2017-07-03 ENCOUNTER — Inpatient Hospital Stay (HOSPITAL_BASED_OUTPATIENT_CLINIC_OR_DEPARTMENT_OTHER): Payer: Medicare Other | Admitting: Genetic Counselor

## 2017-07-03 ENCOUNTER — Telehealth: Payer: Self-pay | Admitting: *Deleted

## 2017-07-03 DIAGNOSIS — Z5111 Encounter for antineoplastic chemotherapy: Secondary | ICD-10-CM | POA: Diagnosis present

## 2017-07-03 DIAGNOSIS — G620 Drug-induced polyneuropathy: Secondary | ICD-10-CM | POA: Insufficient documentation

## 2017-07-03 DIAGNOSIS — D6481 Anemia due to antineoplastic chemotherapy: Secondary | ICD-10-CM

## 2017-07-03 DIAGNOSIS — Z809 Family history of malignant neoplasm, unspecified: Secondary | ICD-10-CM

## 2017-07-03 DIAGNOSIS — Z808 Family history of malignant neoplasm of other organs or systems: Secondary | ICD-10-CM

## 2017-07-03 DIAGNOSIS — K5909 Other constipation: Secondary | ICD-10-CM | POA: Diagnosis not present

## 2017-07-03 DIAGNOSIS — Z8042 Family history of malignant neoplasm of prostate: Secondary | ICD-10-CM | POA: Insufficient documentation

## 2017-07-03 DIAGNOSIS — Z95828 Presence of other vascular implants and grafts: Secondary | ICD-10-CM

## 2017-07-03 DIAGNOSIS — Z803 Family history of malignant neoplasm of breast: Secondary | ICD-10-CM

## 2017-07-03 DIAGNOSIS — C561 Malignant neoplasm of right ovary: Secondary | ICD-10-CM

## 2017-07-03 DIAGNOSIS — Z315 Encounter for genetic counseling: Secondary | ICD-10-CM

## 2017-07-03 DIAGNOSIS — T451X5A Adverse effect of antineoplastic and immunosuppressive drugs, initial encounter: Secondary | ICD-10-CM

## 2017-07-03 DIAGNOSIS — Z8051 Family history of malignant neoplasm of kidney: Secondary | ICD-10-CM

## 2017-07-03 LAB — CBC WITH DIFFERENTIAL/PLATELET
BASOS ABS: 0 10*3/uL (ref 0.0–0.1)
Basophils Relative: 0 %
EOS PCT: 0 %
Eosinophils Absolute: 0 10*3/uL (ref 0.0–0.5)
HEMATOCRIT: 31.3 % — AB (ref 34.8–46.6)
Hemoglobin: 10.6 g/dL — ABNORMAL LOW (ref 11.6–15.9)
LYMPHS ABS: 1.4 10*3/uL (ref 0.9–3.3)
LYMPHS PCT: 14 %
MCH: 34.1 pg — AB (ref 25.1–34.0)
MCHC: 33.9 g/dL (ref 31.5–36.0)
MCV: 100.6 fL (ref 79.5–101.0)
MONO ABS: 0.1 10*3/uL (ref 0.1–0.9)
MONOS PCT: 1 %
NEUTROS ABS: 8.1 10*3/uL — AB (ref 1.5–6.5)
Neutrophils Relative %: 85 %
PLATELETS: 179 10*3/uL (ref 145–400)
RBC: 3.11 MIL/uL — ABNORMAL LOW (ref 3.70–5.45)
RDW: 15.4 % (ref 11.2–16.1)
WBC: 9.6 10*3/uL (ref 3.9–10.3)

## 2017-07-03 LAB — COMPREHENSIVE METABOLIC PANEL
ALT: 15 U/L (ref 0–55)
ANION GAP: 11 (ref 3–11)
AST: 15 U/L (ref 5–34)
Albumin: 3.8 g/dL (ref 3.5–5.0)
Alkaline Phosphatase: 97 U/L (ref 40–150)
BILIRUBIN TOTAL: 0.4 mg/dL (ref 0.2–1.2)
BUN: 19 mg/dL (ref 7–26)
CHLORIDE: 105 mmol/L (ref 98–109)
CO2: 22 mmol/L (ref 22–29)
Calcium: 10 mg/dL (ref 8.4–10.4)
Creatinine, Ser: 0.8 mg/dL (ref 0.60–1.10)
GFR calc Af Amer: >60 mL/min (ref >60)
Glucose, Bld: 140 mg/dL (ref 70–140)
POTASSIUM: 3.8 mmol/L (ref 3.3–4.7)
Sodium: 138 mmol/L (ref 136–145)
TOTAL PROTEIN: 6.7 g/dL (ref 6.4–8.3)

## 2017-07-03 MED ORDER — SODIUM CHLORIDE 0.9 % IV SOLN: Freq: Once | INTRAVENOUS | Status: DC

## 2017-07-03 MED ORDER — PEGFILGRASTIM 6 MG/0.6ML ~~LOC~~ PSKT
PREFILLED_SYRINGE | SUBCUTANEOUS | Status: AC
  Filled 2017-07-03: qty 0.6

## 2017-07-03 MED ORDER — DIPHENHYDRAMINE HCL 50 MG/ML IJ SOLN
INTRAMUSCULAR | Status: AC
  Filled 2017-07-03: qty 1

## 2017-07-03 MED ORDER — PALONOSETRON HCL INJECTION 0.25 MG/5ML
INTRAVENOUS | Status: AC
  Filled 2017-07-03: qty 5

## 2017-07-03 MED ORDER — FAMOTIDINE IN NACL 20-0.9 MG/50ML-% IV SOLN
20.0000 mg | Freq: Once | INTRAVENOUS | Status: AC
  Administered 2017-07-03: 20 mg via INTRAVENOUS

## 2017-07-03 MED ORDER — SODIUM CHLORIDE 0.9% FLUSH
10.0000 mL | INTRAVENOUS | Status: DC | PRN
  Administered 2017-07-03: 10 mL
  Filled 2017-07-03: qty 10

## 2017-07-03 MED ORDER — PEGFILGRASTIM 6 MG/0.6ML ~~LOC~~ PSKT
6.0000 mg | PREFILLED_SYRINGE | Freq: Once | SUBCUTANEOUS | Status: AC
  Administered 2017-07-03: 6 mg via SUBCUTANEOUS

## 2017-07-03 MED ORDER — ONDANSETRON HCL 4 MG/2ML IJ SOLN: 8.0000 mg | Freq: Once | INTRAMUSCULAR | Status: DC

## 2017-07-03 MED ORDER — CARBOPLATIN CHEMO INJECTION 450 MG/45ML
317.7000 mg | Freq: Once | INTRAVENOUS | Status: AC
  Administered 2017-07-03: 320 mg via INTRAVENOUS
  Filled 2017-07-03: qty 32

## 2017-07-03 MED ORDER — SODIUM CHLORIDE 0.9 % IV SOLN
Freq: Once | INTRAVENOUS | Status: AC
  Administered 2017-07-03: 12:00:00 via INTRAVENOUS

## 2017-07-03 MED ORDER — HEPARIN SOD (PORK) LOCK FLUSH 100 UNIT/ML IV SOLN
250.0000 [IU] | Freq: Once | INTRAVENOUS | Status: DC | PRN
  Filled 2017-07-03: qty 5

## 2017-07-03 MED ORDER — HEPARIN SOD (PORK) LOCK FLUSH 100 UNIT/ML IV SOLN
500.0000 [IU] | Freq: Once | INTRAVENOUS | Status: AC | PRN
  Administered 2017-07-03: 500 [IU]
  Filled 2017-07-03: qty 5

## 2017-07-03 MED ORDER — SODIUM CHLORIDE 0.9 % IV SOLN
Freq: Once | INTRAVENOUS | Status: AC
  Administered 2017-07-03: 12:00:00 via INTRAVENOUS
  Filled 2017-07-03: qty 5

## 2017-07-03 MED ORDER — FAMOTIDINE IN NACL 20-0.9 MG/50ML-% IV SOLN
INTRAVENOUS | Status: AC
  Filled 2017-07-03: qty 50

## 2017-07-03 MED ORDER — DIPHENHYDRAMINE HCL 50 MG/ML IJ SOLN
50.0000 mg | Freq: Once | INTRAMUSCULAR | Status: AC
  Administered 2017-07-03: 50 mg via INTRAVENOUS

## 2017-07-03 MED ORDER — SODIUM CHLORIDE 0.9 % IV SOLN
131.2500 mg/m2 | Freq: Once | INTRAVENOUS | Status: AC
  Administered 2017-07-03: 228 mg via INTRAVENOUS
  Filled 2017-07-03: qty 38

## 2017-07-03 MED ORDER — ALTEPLASE 2 MG IJ SOLR
2.0000 mg | Freq: Once | INTRAMUSCULAR | Status: DC | PRN
  Filled 2017-07-03: qty 2

## 2017-07-03 MED ORDER — PALONOSETRON HCL INJECTION 0.25 MG/5ML
0.2500 mg | Freq: Once | INTRAVENOUS | Status: AC
  Administered 2017-07-03: 0.25 mg via INTRAVENOUS

## 2017-07-03 MED ORDER — HEPARIN SOD (PORK) LOCK FLUSH 100 UNIT/ML IV SOLN
500.0000 [IU] | Freq: Once | INTRAVENOUS | Status: DC | PRN
  Filled 2017-07-03: qty 5

## 2017-07-03 MED ORDER — HEPARIN SOD (PORK) LOCK FLUSH 100 UNIT/ML IV SOLN
500.0000 [IU] | Freq: Once | INTRAVENOUS | Status: DC
  Filled 2017-07-03: qty 5

## 2017-07-03 MED ORDER — SODIUM CHLORIDE 0.9% FLUSH
10.0000 mL | Freq: Once | INTRAVENOUS | Status: DC
  Filled 2017-07-03: qty 10

## 2017-07-03 NOTE — Patient Instructions (Signed)
[facility] Discharge Instructions for Patients Receiving Chemotherapy  Today you received the following chemotherapy agents: Carboplatin (Paraplatin) and Paclitaxel (Taxol).  To help prevent nausea and vomiting after your treatment, we encourage you to take your nausea medication as prescribed. Received Aloxi during treatment-->take Compazine (not Zofran) for the next 3 days.  If you develop nausea and vomiting that is not controlled by your nausea medication, call the clinic.   BELOW ARE SYMPTOMS THAT SHOULD BE REPORTED IMMEDIATELY:  *FEVER GREATER THAN 100.5 F  *CHILLS WITH OR WITHOUT FEVER  NAUSEA AND VOMITING THAT IS NOT CONTROLLED WITH YOUR NAUSEA MEDICATION  *UNUSUAL SHORTNESS OF BREATH  *UNUSUAL BRUISING OR BLEEDING  TENDERNESS IN MOUTH AND THROAT WITH OR WITHOUT PRESENCE OF ULCERS  *URINARY PROBLEMS  *BOWEL PROBLEMS  UNUSUAL RASH Items with * indicate a potential emergency and should be followed up as soon as possible.  Feel free to call the clinic should you have any questions or concerns. The clinic phone number is [phone].  Please show the [place] at check-in to the Emergency Department and triage nurse.

## 2017-07-03 NOTE — Assessment & Plan Note (Signed)
She will complete last cycle of treatment today Genetic counseling is pending We discussed the risk and benefits of port removal For now, her daughter wants to keep the port I will schedule port flush every 6 weeks and blood work with tumor marker monitoring every 3 months I will schedule return appointment for her to see GYN oncologist in approximately 3 months

## 2017-07-03 NOTE — Assessment & Plan Note (Signed)
This is likely due to recent treatment. The patient denies recent history of bleeding such as epistaxis, hematuria or hematochezia. She is asymptomatic from the anemia. I will observe for now.   

## 2017-07-03 NOTE — Progress Notes (Signed)
REFERRING PROVIDER: Heath Lark, MD [place], [place] [id number]  PRIMARY PROVIDER:  Monico Blitz, MD  PRIMARY REASON FOR VISIT:  1. Ovarian cancer on right ([place])   2. Family history of breast cancer   3. Family history of prostate cancer   4. Family history of kidney cancer      HISTORY OF PRESENT ILLNESS:   Denise Bonilla, a 84 y.o. female, was seen for a [facility] cancer genetics consultation at the request of Dr. Alvy Bimler due to a personal and family history of cancer.  Denise Bonilla presents to clinic today to discuss the possibility of a hereditary predisposition to cancer, genetic testing, and to further clarify her future cancer risks, as well as potential cancer risks for family members.   In 2018, at the age of 61, Denise Bonilla was diagnosed with ovarian cancer. This was treated with surgery and chemotherapy.  Denise Bonilla had her blood drawn and HRD testing performed prior to her infusion about 3 weeks ago.  She is returning for genetic counseling regarding testing and her results with her daughter, Denise Bonilla.     CANCER HISTORY:  Oncology History   Negative BRCA in tumor analysis and blood test     Ovarian cancer on right [facility])   12/07/2016 Imaging    Ct angiogram:  No evidence of pulmonary embolus.  Large left and small right pleural effusions. Complete collapse of the left lower lobe, partial collapse of the lingula and milder atelectasis of the left upper lobe, likely due to compressive atelectasis. The collapsed lung parenchyma is poorly evaluated, but no obvious pulmonary masses are seen.  Interstitial pulmonary edema.  Small volume abdominal ascites.  Heterogeneous appearance of the thyroid gland, with right sided nodules.  Aortic Atherosclerosis (ICD10-I70.0).      12/07/2016 - 12/13/2016 Hospital Admission    She was admitted for generalized malaise, abnormal blood work, large bilateral pleural effusion and possible right ovarian cancer       12/08/2016 Imaging    LV EF: 65% -   70%      12/08/2016 Pathology Results    PLEURAL FLUID, LEFT (SPECIMEN 1 OF 1, COLLECTED ON 12/08/16): - MALIGNANT CELLS CONSISTENT WITH METASTATIC ADENOCARCINOMA, SEE COMMENT      12/08/2016 Procedure    Successful ultrasound guided left thoracentesis yielding 1.6 L of pleural fluid. The procedure was terminated early secondary to significant coughing.      12/12/2016 Imaging    CT scan of abdomen and pelvis 1. Septated right adnexal cyst measuring 8.5 x 6.5 cm, concerning for ovarian malignancy in this setting. Small volume complex intra-abdominal ascites which tracks in the pericolic gutters and anterior omentum. No definite soft tissue omental caking. 2. Endometrial thickening versus fluid in the endometrial canal, nonspecific. Rounded 3.9 cm lesion in the uterine fundus may be a fibroid, however would be unusual for patient's age. Endometrial/ uterine neoplasm is considered. 3. Nonspecific ill-defined low-density lesion in the right lobe of the liver. Probable hepatic hemangioma at the dome. 4. Further evaluation of the GYN structures could be performed with ultrasound. MRI may provide further detail if needed for potential surgical planning. 5. Aortic atherosclerosis      12/12/2016 Imaging    CXR 1. Progressive left base atelectasis and infiltrate. Small left pleural effusion again noted. 2.  Cardiomegaly, no evidence of pulmonary venous congestion.      12/13/2016 Tumor Marker    Patient's tumor was tested for the following markers: CA-125. Results of the  tumor marker test revealed 1310.      November 26, 202018 Procedure    Ultrasound and fluoroscopically guided right internal jugular single lumen power port catheter insertion. Tip in the SVC/RA junction. Catheter ready for use.      01/01/2017 -  Chemotherapy    She received carboplatin and Taxol x 5 cycles, interrupted in November for interval debulking surgery, and resumed to receive 3 more cycles from  December to January 2019      01/07/2017 - 01/11/2017 Hospital Admission    She was admitted for severe nausea/vomiting and pleural effusion.      01/08/2017 Procedure    Successful ultrasound guided left thoracentesis yielding 1750 mL of pleural fluid      01/08/2017 Imaging    Marked improvement in left pleural effusion following thoracentesis. Negative for pneumothorax      01/22/2017 Adverse Reaction    Second dose of chemo requires significant dose adjustment and GCSF support      02/13/2017 Tumor Marker    Patient's tumor was tested for the following markers: CA-125. Results of the tumor marker test revealed 310.6      03/05/2017 Imaging    1. Persistent mild ventral peritoneal / omental nodularity in the LEFT abdomen concerning for peritoneal carcinomatosis. 2. Interval reduction in free fluid the abdomen pelvis. 3. Slight decrease in volume of large RIGHT adnexal cystic mass. 4. Stable LEFT pleural effusion.      03/06/2017 Tumor Marker    Patient's tumor was tested for the following markers: CA-125. Results of the tumor marker test revealed 128.1      03/27/2017 Tumor Marker    Patient's tumor was tested for the following markers: CA-125. Results of the tumor marker test revealed 96.5      04/22/2017 Pathology Results    1. Uterus +/- tubes/ovaries, neoplastic - RIGHT OVARY: HIGH GRADE CARCINOMA, SPANNING 7 CM, SEE COMMENT. PSAMMOMATOUS DEPOSITS ON SURFACE. SEE ONCOLOGY TABLE. - LEFT MESO-OVARIUM: METASTATIC HIGH GRADE CARCINOMA. DEFINITIVE OVARIAN TISSUE NOT IDENTIFIED. - UTERUS: -ENDOMETRIUM: BENIGN ENDOMETRIAL TYPE POLYP. NO MALIGNANCY IDENTIFIED. -MYOMETRIUM: -SEROSA: PSAMMOMATOUS DEPOSITS. NO VIABLE TUMOR IDENTIFIED. - CERVIX: NO DYSPLASIA OR MALIGNANCY. - BILATERAL FALLOPIAN TUBES: RIGHT: METASTATIC HIGH GRADE CARCINOMA. PARATUBAL CYSTS. LEFT: PSAMMOMATOUS DEPOSITS. 2. Omentum, biopsy - METASTATIC HIGH GRADE CARCINOMA. Microscopic Comment 1.  OVARY Specimen(s): Uterus, cervix, bilateral fallopian tubes, right ovary, omentum. Procedure: (including lymph node sampling): Total hysterectomy with bilateral salpingo-oophorectomy and omental biopsy. Primary tumor site (including laterality): Right ovary. Ovarian surface involvement: Present. Ovarian capsule intact without fragmentation: Yes. Maximum tumor size (cm): 7 cm. Histologic type: High grade carcinoma, see comment. Grade: High grade. Peritoneal implants: (specify invasive or non-invasive): Present, invasive. Pelvic extension (list additional structures on separate lines and if involved): Right fallopian tube, left meso-ovarium, omentum (>2 cm deposit). Lymph nodes: number examined 0 ; number positive 0  TNM code: pT3c, pN0, pMX FIGO Stage (based on pathologic findings, needs clinical correlation): IIIC Comments: The tumor consists of large papillary type excrescences with high grade tumor. There are clear cell features and the tumor is negative for WT-1 and p53, thus the tumor is favored to be a clear cell carcinoma or at least have had a component of clear cell carcinoma (residual after treatment). Dr. Lyndon Code has reviewed select slides      04/22/2017 Surgery    Pre-operative Diagnosis: stage IV ovarian cancer, s/p neoadjuvant chemotherapy  Post-operative Diagnosis: same  Operation: Robotic-assisted laparoscopic total hysterectomy with bilateral salpingoophorectomy, ex lap, omentectomy, mobilzation of splenic flexure,  radical tumor debulking.  Surgeon: Donaciano Eva  Assistant Surgeon: Lahoma Crocker MD  Operative Findings:  : 7cm right ovarian cyst,  4cm tumor knot in left upper quadrant adherent to the splenic flexure of the colon and stomach.  No gross residual disease at the end of the procedure representing an optimal cytoreduction.        05/23/2017 Tumor Marker    Patient's tumor was tested for the following markers: CA-125. Results of the  tumor marker test revealed 69.4      06/13/2017 Tumor Marker    Patient's tumor was tested for the following markers: CA-125. Results of the tumor marker test revealed 56      06/13/2017 Genetic Testing    Patient has genetic testing done for genetics with [facility] Results revealed patient has no mutations         HORMONAL RISK FACTORS:  Menarche was at age 23.  First live birth at age 38.  OCP use for approximately 0 years.  Ovaries intact: no.  Hysterectomy: yes.  Menopausal status: postmenopausal.  HRT use: 0 years. Colonoscopy: no; not examined. Mammogram within the last year: yes. Number of breast biopsies: 0. Up to date with pelvic exams:  yes. Any excessive radiation exposure in the past:  no  Past Medical History:  Diagnosis Date  . CHF (congestive heart failure) (HCC)    mild  . Complication of anesthesia   . Dementia   . Family history of adverse reaction to anesthesia    Daughter [place] also  . Family history of breast cancer   . Family history of kidney cancer   . Family history of prostate cancer   . Hyperlipidemia   . Hypertension   . Malignant pleural effusion    Left  . Ovarian cancer on right ([place]) 12/23/2016  . PONV (postoperative nausea and vomiting)     Past Surgical History:  Procedure Laterality Date  . ABDOMINAL HYSTERECTOMY     04/22/17 Dr. Denman George  . CYSTECTOMY    . DEBULKING N/A 04/22/2017   Procedure: TUMOR DEBULKING;  Surgeon: Everitt Amber, MD;  Location: [facility];  Service: Gynecology;  Laterality: N/A;  . INNER EAR SURGERY     cyst removed  . IR FLUORO GUIDE PORT INSERTION RIGHT  January 15, 202018  . IR US GUIDE VASC ACCESS RIGHT  January 15, 202018  . LAPAROTOMY N/A 04/22/2017   Procedure: EXPLORATORY LAPAROTOMY;  Surgeon: Everitt Amber, MD;  Location: [facility];  Service: Gynecology;  Laterality: N/A;  . OMENTECTOMY N/A 04/22/2017   Procedure: OMENTECTOMY;  Surgeon: Everitt Amber, MD;  Location: [facility];  Service: Gynecology;  Laterality: N/A;  . ROBOTIC  ASSISTED TOTAL HYSTERECTOMY WITH BILATERAL SALPINGO OOPHERECTOMY Bilateral 04/22/2017   Procedure: XI ROBOTIC ASSISTED TOTAL HYSTERECTOMY WITH BILATERAL SALPINGO OOPHORECTOMY;  Surgeon: Everitt Amber, MD;  Location: [facility];  Service: Gynecology;  Laterality: Bilateral;    Social History   Socioeconomic History  . Marital status: Widowed    Spouse name: Not on file  . Number of children: 3  . Years of education: Not on file  . Highest education level: Not on file  Social Needs  . Financial resource strain: Not on file  . Food insecurity - worry: Not on file  . Food insecurity - inability: Not on file  . Transportation needs - medical: Not on file  . Transportation needs - non-medical: Not on file  Occupational History  . Occupation: retired  Tobacco Use  . Smoking status: Never Smoker  .  Smokeless tobacco: Never Used  Substance and Sexual Activity  . Alcohol use: No  . Drug use: No  . Sexual activity: No  Other Topics Concern  . Not on file  Social History Narrative  . Not on file     FAMILY HISTORY:  We obtained a detailed, 4-generation family history.  Significant diagnoses are listed below: Family History  Problem Relation Age of Onset  . Cancer Other   . Hyperlipidemia Other   . Hypertension Other   . Heart failure Mother   . Breast cancer Sister        dx in her 72s  . Lymphoma Father 36  . Prostate cancer Brother 74  . Kidney cancer Brother 38  . Colon cancer Maternal Aunt   . Breast cancer Sister 106  . Breast cancer Sister 28  . Prostate cancer Brother   . Breast cancer Other 74       Niece  . Cancer Other        nephew with a neuroendocrine tumor of the head/neck area  . Prostate cancer Other 22       nephew  . Melanoma Other        nephew  . Cancer Cousin        unknown cancer - pat first cousin  . Esophageal cancer Daughter 34  . Cancer Other 49       unknown cancer    The patient had three daughters.  One died of esophageal cancer at age 31.   She had 16 siblings - 98 brothers and 5 sisters.  One sister died of poisoning at age 52, and three sisters had breast cancer in their 39's.  One of these sisters had a son with a neuroendocrine tumor in his neck.  Of the 11 brothers, four died of heart attacks/disease, one brother had a daughter with breast cancer, one brother had prostate cancer and had a son with prostate cancer.  Another brother has both prostate and kidney cancer.  Another brother died young and had a daughter who died of cancer (unknown type) at 54.  Both parents are deceased.  The patients' mother died at 39.  She had four brothers and three sisters.  One sister had colon cancer.  Both maternal grandparents are deceased.  The patient's father had two brothers who had strokes.  One brother had a daughter who had an unknown form of cancer.  Both paternal grandparents are deceased.  Ms. Colantonio is unaware of previous family history of genetic testing for hereditary cancer risks. Patient's ancestors are of Caucasian descent. There is no reported Ashkenazi Jewish ancestry. There is no known consanguinity.  GENETIC COUNSELING ASSESSMENT: Denise Bonilla is a 84 y.o. female with a personal history of ovarian cancer and family history of breast, prostate and neuroendocrine cancer which is somewhat suggestive of a hereditary cancer syndrome and predisposition to cancer. We, therefore, discussed and recommended the following at today's visit.   DISCUSSION: We discussed that about 20% of ovarian cancer is due to hereditary causes, most commonly BRCA mutations, but sometimes changes in Lynch syndrome genes. There are other genes that can increase the risk for hereditary ovarian cancer, including BRIP1, RAD51C, and RAD51D.  We reviewed the characteristics, features and inheritance patterns of hereditary cancer syndromes. We also discussed genetic testing, including the appropriate family members to test, the process of testing, insurance coverage  and turn-around-time for results. We discussed the implications of a negative, positive and/or variant of uncertain  significant result. We recommended Denise Bonilla pursue genetic testing for the [facility] gene panel. The [facility] gene panel offered by [employer] includes sequencing and deletion/duplication testing of the following 28 genes: APC, ATM, BARD1, BMPR1A, BRCA1, BRCA2, BRIP1, CHD1, CDK4, CDKN2A, CHEK2, EPCAM (large rearrangement only), MLH1, MSH2, MSH6, MUTYH, NBN, PALB2, PMS2, PTEN, RAD51C, RAD51D, SMAD4, STK11, and TP53. Sequencing was performed for select regions of POLE and POLD1, and large rearrangement analysis was performed for select regions of GREM1.   We discussed that genetic testing through [facility] will test for hereditary mutations that could explain her diagnosis of cancer.  However, homologous recombination testing (HRD) is genetic testing performed on her tumor that can determine genetic changes that could influence her management.  HRD testing is performed in tandem with genetic testing, and typically at no additional cost.   Based on Denise Bonilla's personal and family history of cancer, she meets medical criteria for genetic testing. Despite that she meets criteria, she may still have an out of pocket cost. We discussed that if her out of pocket cost for testing is over $100, the laboratory will call and confirm whether she wants to proceed with testing.  If the out of pocket cost of testing is less than $100 she will be billed by the genetic testing laboratory.   Blood was drawn on June 13, 2017 and sent to [facility]. Denise Bonilla is here with her daughter today to go over the results.  GENETIC TEST RESULTS: Genetic testing reported out on July 03, 2017 through the [facility] cancer panel found no deleterious mutations.  The [facility] gene panel offered by [employer] includes sequencing and deletion/duplication testing of the following 28 genes:  APC, ATM, BARD1, BMPR1A, BRCA1, BRCA2, BRIP1, CHD1, CDK4, CDKN2A, CHEK2, EPCAM (large rearrangement only), MLH1, MSH2, MSH6, MUTYH, NBN, PALB2, PMS2, PTEN, RAD51C, RAD51D, SMAD4, STK11, and TP53. Sequencing was performed for select regions of POLE and POLD1, and large rearrangement analysis was performed for select regions of GREM1.  The test report has been scanned into [employer] and is located under the Molecular Pathology section of the Results Review tab.   HRD testing was also negative.  There were not any BRCA mutations identified within the tumor.  Genomic instability testing was not able to be performed.  Negative HRD testing is suggestive that she would not meet criteria for PARP inhibitors.   We discussed with Denise Bonilla that since the current genetic testing is not perfect, it is possible there may be a gene mutation in one of these genes that current testing cannot detect, but that chance is small. We also discussed, that it is possible that another gene that has not yet been discovered, or that we have not yet tested, is responsible for the cancer diagnoses in the family, and it is, therefore, important to remain in touch with cancer genetics in the future so that we can continue to offer Denise Bonilla the most up to date genetic testing.   CANCER SCREENING RECOMMENDATIONS: Given Denise Bonilla's personal and family histories, we must interpret these negative results with some caution.  Families with features suggestive of hereditary risk for cancer tend to have multiple family members with cancer, diagnoses in multiple generations and diagnoses before the age of 82. Denise Bonilla's family exhibits some of these features. Thus this result may simply reflect our current inability to detect all mutations within these genes or there may be a different gene that has not yet been discovered  or tested.   RECOMMENDATIONS FOR FAMILY MEMBERS: Women in this family might be at some increased risk of developing cancer,  over the general population risk, simply due to the family history of cancer. We recommended women in this family have a yearly mammogram beginning at age 52, or 51 years younger than the earliest onset of cancer, an annual clinical breast exam, and perform monthly breast self-exams. Women in this family should also have a gynecological exam as recommended by their primary provider. All family members should have a colonoscopy by age 34.  Based on Denise Bonilla's family history, we recommended her brother, Sallee Lange, who was diagnosed with prostate and kidney cancer, have genetic counseling and testing. Denise Bonilla will let us know if we can be of any assistance in coordinating genetic counseling and/or testing for this family member.   FOLLOW-UP: Lastly, we discussed with Denise Bonilla that cancer genetics is a rapidly advancing field and it is possible that new genetic tests will be appropriate for her and/or her family members in the future. We encouraged her to remain in contact with cancer genetics on an annual basis so we can update her personal and family histories and let her know of advances in cancer genetics that may benefit this family.   Our contact number was provided. Denise Bonilla questions were answered to her satisfaction, and she knows she is welcome to call us at anytime with additional questions or concerns.   Roma Kayser, MS, [facility] Certified Genetic Counselor [email or web address]    The patient was seen for a total of 60 minutes in face-to-face genetic counseling.  This patient was discussed with Drs. Magrinat, Lindi Adie and/or Burr Medico who agrees with the above.    _______________________________________________________________________ For Office Staff:  Number of people involved in session: 2 Was an Intern/ student involved with case: no

## 2017-07-03 NOTE — Telephone Encounter (Signed)
Gave patient avs and calendar with appts per 1/24 los.  °

## 2017-07-03 NOTE — Telephone Encounter (Signed)
Called and spoke to [place] and gave the new a[ppts for April 18th .

## 2017-07-03 NOTE — Progress Notes (Signed)
[place] OFFICE PROGRESS NOTE  Patient Care Team: Monico Blitz, MD as PCP - General (Internal Medicine)  SUMMARY OF ONCOLOGIC HISTORY: Oncology History   Negative BRCA in tumor analysis and blood test     Ovarian cancer on right [facility])   12/07/2016 Imaging    Ct angiogram:  No evidence of pulmonary embolus.  Large left and small right pleural effusions. Complete collapse of the left lower lobe, partial collapse of the lingula and milder atelectasis of the left upper lobe, likely due to compressive atelectasis. The collapsed lung parenchyma is poorly evaluated, but no obvious pulmonary masses are seen.  Interstitial pulmonary edema.  Small volume abdominal ascites.  Heterogeneous appearance of the thyroid gland, with right sided nodules.  Aortic Atherosclerosis (ICD10-I70.0).      12/07/2016 - 12/13/2016 Hospital Admission    She was admitted for generalized malaise, abnormal blood work, large bilateral pleural effusion and possible right ovarian cancer      12/08/2016 Imaging    LV EF: 65% -   70%      12/08/2016 Pathology Results    PLEURAL FLUID, LEFT (SPECIMEN 1 OF 1, COLLECTED ON 12/08/16): - MALIGNANT CELLS CONSISTENT WITH METASTATIC ADENOCARCINOMA, SEE COMMENT      12/08/2016 Procedure    Successful ultrasound guided left thoracentesis yielding 1.6 L of pleural fluid. The procedure was terminated early secondary to significant coughing.      12/12/2016 Imaging    CT scan of abdomen and pelvis 1. Septated right adnexal cyst measuring 8.5 x 6.5 cm, concerning for ovarian malignancy in this setting. Small volume complex intra-abdominal ascites which tracks in the pericolic gutters and anterior omentum. No definite soft tissue omental caking. 2. Endometrial thickening versus fluid in the endometrial canal, nonspecific. Rounded 3.9 cm lesion in the uterine fundus may be a fibroid, however would be unusual for patient's age. Endometrial/ uterine neoplasm is  considered. 3. Nonspecific ill-defined low-density lesion in the right lobe of the liver. Probable hepatic hemangioma at the dome. 4. Further evaluation of the GYN structures could be performed with ultrasound. MRI may provide further detail if needed for potential surgical planning. 5. Aortic atherosclerosis      12/12/2016 Imaging    CXR 1. Progressive left base atelectasis and infiltrate. Small left pleural effusion again noted. 2.  Cardiomegaly, no evidence of pulmonary venous congestion.      12/13/2016 Tumor Marker    Patient's tumor was tested for the following markers: CA-125. Results of the tumor marker test revealed 1310.      10-30-202018 Procedure    Ultrasound and fluoroscopically guided right internal jugular single lumen power port catheter insertion. Tip in the SVC/RA junction. Catheter ready for use.      01/01/2017 -  Chemotherapy    She received carboplatin and Taxol x 5 cycles, interrupted in November for interval debulking surgery, and resumed to receive 3 more cycles from December to January 2019      01/07/2017 - 01/11/2017 Hospital Admission    She was admitted for severe nausea/vomiting and pleural effusion.      01/08/2017 Procedure    Successful ultrasound guided left thoracentesis yielding 1750 mL of pleural fluid      01/08/2017 Imaging    Marked improvement in left pleural effusion following thoracentesis. Negative for pneumothorax      01/22/2017 Adverse Reaction    Second dose of chemo requires significant dose adjustment and GCSF support      02/13/2017 Tumor Marker  Patient's tumor was tested for the following markers: CA-125. Results of the tumor marker test revealed 310.6      03/05/2017 Imaging    1. Persistent mild ventral peritoneal / omental nodularity in the LEFT abdomen concerning for peritoneal carcinomatosis. 2. Interval reduction in free fluid the abdomen pelvis. 3. Slight decrease in volume of large RIGHT adnexal cystic mass. 4. Stable  LEFT pleural effusion.      03/06/2017 Tumor Marker    Patient's tumor was tested for the following markers: CA-125. Results of the tumor marker test revealed 128.1      03/27/2017 Tumor Marker    Patient's tumor was tested for the following markers: CA-125. Results of the tumor marker test revealed 96.5      04/22/2017 Pathology Results    1. Uterus +/- tubes/ovaries, neoplastic - RIGHT OVARY: HIGH GRADE CARCINOMA, SPANNING 7 CM, SEE COMMENT. PSAMMOMATOUS DEPOSITS ON SURFACE. SEE ONCOLOGY TABLE. - LEFT MESO-OVARIUM: METASTATIC HIGH GRADE CARCINOMA. DEFINITIVE OVARIAN TISSUE NOT IDENTIFIED. - UTERUS: -ENDOMETRIUM: BENIGN ENDOMETRIAL TYPE POLYP. NO MALIGNANCY IDENTIFIED. -MYOMETRIUM: -SEROSA: PSAMMOMATOUS DEPOSITS. NO VIABLE TUMOR IDENTIFIED. - CERVIX: NO DYSPLASIA OR MALIGNANCY. - BILATERAL FALLOPIAN TUBES: RIGHT: METASTATIC HIGH GRADE CARCINOMA. PARATUBAL CYSTS. LEFT: PSAMMOMATOUS DEPOSITS. 2. Omentum, biopsy - METASTATIC HIGH GRADE CARCINOMA. Microscopic Comment 1. OVARY Specimen(s): Uterus, cervix, bilateral fallopian tubes, right ovary, omentum. Procedure: (including lymph node sampling): Total hysterectomy with bilateral salpingo-oophorectomy and omental biopsy. Primary tumor site (including laterality): Right ovary. Ovarian surface involvement: Present. Ovarian capsule intact without fragmentation: Yes. Maximum tumor size (cm): 7 cm. Histologic type: High grade carcinoma, see comment. Grade: High grade. Peritoneal implants: (specify invasive or non-invasive): Present, invasive. Pelvic extension (list additional structures on separate lines and if involved): Right fallopian tube, left meso-ovarium, omentum (>2 cm deposit). Lymph nodes: number examined 0 ; number positive 0  TNM code: pT3c, pN0, pMX FIGO Stage (based on pathologic findings, needs clinical correlation): IIIC Comments: The tumor consists of large papillary type excrescences with high grade tumor. There  are clear cell features and the tumor is negative for WT-1 and p53, thus the tumor is favored to be a clear cell carcinoma or at least have had a component of clear cell carcinoma (residual after treatment). Dr. Lyndon Code has reviewed select slides      04/22/2017 Surgery    Pre-operative Diagnosis: stage IV ovarian cancer, s/p neoadjuvant chemotherapy  Post-operative Diagnosis: same  Operation: Robotic-assisted laparoscopic total hysterectomy with bilateral salpingoophorectomy, ex lap, omentectomy, mobilzation of splenic flexure, radical tumor debulking.  Surgeon: Donaciano Eva  Assistant Surgeon: Lahoma Crocker MD  Operative Findings:  : 7cm right ovarian cyst,  4cm tumor knot in left upper quadrant adherent to the splenic flexure of the colon and stomach.  No gross residual disease at the end of the procedure representing an optimal cytoreduction.        05/23/2017 Tumor Marker    Patient's tumor was tested for the following markers: CA-125. Results of the tumor marker test revealed 69.4      06/13/2017 Tumor Marker    Patient's tumor was tested for the following markers: CA-125. Results of the tumor marker test revealed 56      06/13/2017 Genetic Testing    Patient has genetic testing done for genetics with [facility] Results revealed patient has no mutations        INTERVAL HISTORY: Please see below for problem oriented charting. She returns for final chemotherapy today She tolerated last cycle well She has mild minimum neuropathy  in the hands but it does not bother her No recent infection, fever or chills Appetite is stable, no recent weight loss She has mild chronic constipation, stable Denies abdominal bloating  REVIEW OF SYSTEMS:   Constitutional: Denies fevers, chills or abnormal weight loss Eyes: Denies blurriness of vision Ears, nose, mouth, throat, and face: Denies mucositis or sore throat Respiratory: Denies cough, dyspnea or  wheezes Cardiovascular: Denies palpitation, chest discomfort or lower extremity swelling Skin: Denies abnormal skin rashes Lymphatics: Denies new lymphadenopathy or easy bruising Neurological:Denies numbness, tingling or new weaknesses Behavioral/Psych: Mood is stable, no new changes  All other systems were reviewed with the patient and are negative.  I have reviewed the past medical history, past surgical history, social history and family history with the patient and they are unchanged from previous note.  ALLERGIES:  is allergic to tetanus toxoids and codeine.  MEDICATIONS:  Current Outpatient Medications  Medication Sig Dispense Refill  . acetaminophen (TYLENOL) 500 MG tablet Take 500 mg by mouth every 8 (eight) hours as needed for mild pain or headache.    Marland Kitchen aspirin EC 81 MG tablet Take 81 mg by mouth daily.    . bisoprolol (ZEBETA) 10 MG tablet Take 10 mg by mouth daily.    . cephALEXin (KEFLEX) 500 MG capsule Take 1 capsule (500 mg total) by mouth 2 (two) times daily. 28 capsule 0  . Cholecalciferol (VITAMIN D) 2000 units tablet Take 2,000 Units by mouth daily.    Marland Kitchen dexamethasone (DECADRON) 4 MG tablet 5 tabs the night before and 5 tabs in the morning of chemo, take with food, every 3 weeks 30 tablet 1  . furosemide (LASIX) 20 MG tablet Take 1 tablet (20 mg total) by mouth daily. 30 tablet 0  . Krill Oil 500 MG CAPS Take 500 mg by mouth daily.    Marland Kitchen lidocaine-prilocaine (EMLA) cream Apply to affected area once (Patient taking differently: Apply 1 application topically See admin instructions. Apply to affected area once) 30 g 3  . loratadine (CLARITIN) 10 MG tablet Take 10 mg by mouth daily as needed (before and after chemo treatment).    . Multiple Vitamins-Minerals (MULTI FOR HER 50+) TABS Take 1 tablet by mouth daily.    . ondansetron (ZOFRAN) 8 MG tablet Take three times a day as needed (Patient taking differently: Take 8 mg by mouth every 8 (eight) hours as needed for nausea or  vomiting. Take three times a day as needed) 30 tablet 1  . prochlorperazine (COMPAZINE) 10 MG tablet Take 1 tablet (10 mg total) by mouth every 6 (six) hours as needed (Nausea or vomiting). 30 tablet 1  . simvastatin (ZOCOR) 20 MG tablet Take 20 mg by mouth daily at 6 PM.     . traMADol (ULTRAM) 50 MG tablet Take 1 tablet (50 mg total) every 6 (six) hours as needed by mouth. 10 tablet 0   No current facility-administered medications for this visit.     PHYSICAL EXAMINATION: ECOG PERFORMANCE STATUS: 1 - Symptomatic but completely ambulatory  Vitals:   07/03/17 0826  BP: 105/85  Pulse: 73  Resp: 20  Temp: 98.2 F (36.8 C)  SpO2: 100%   Filed Weights   07/03/17 0826  Weight: 137 lb 8 oz (62.4 kg)    GENERAL:alert, no distress and comfortable SKIN: skin color, texture, turgor are normal, no rashes or significant lesions EYES: normal, Conjunctiva are pink and non-injected, sclera clear OROPHARYNX:no exudate, no erythema and lips, buccal mucosa, and tongue  normal  NECK: supple, thyroid normal size, non-tender, without nodularity LYMPH:  no palpable lymphadenopathy in the cervical, axillary or inguinal LUNGS: clear to auscultation and percussion with normal breathing effort HEART: regular rate & rhythm and no murmurs and no lower extremity edema ABDOMEN:abdomen soft, non-tender and normal bowel sounds Musculoskeletal:no cyanosis of digits and no clubbing  NEURO: alert & oriented x 3 with fluent speech, no focal motor/sensory deficits  LABORATORY DATA:  I have reviewed the data as listed    Component Value Date/Time   NA 138 07/03/2017 0753   NA 137 06/13/2017 0925   K 3.8 07/03/2017 0753   K 3.9 06/13/2017 0925   CL 105 07/03/2017 0753   CO2 22 07/03/2017 0753   CO2 24 06/13/2017 0925   GLUCOSE 140 07/03/2017 0753   GLUCOSE 163 (H) 06/13/2017 0925   BUN 19 07/03/2017 0753   BUN 21.0 06/13/2017 0925   CREATININE 0.80 07/03/2017 0753   CREATININE 0.8 06/13/2017 0925    CALCIUM 10.0 07/03/2017 0753   CALCIUM 9.7 06/13/2017 0925   PROT 6.7 07/03/2017 0753   PROT 6.7 06/13/2017 0925   ALBUMIN 3.8 07/03/2017 0753   ALBUMIN 3.6 06/13/2017 0925   AST 15 07/03/2017 0753   AST 25 06/13/2017 0925   ALT 15 07/03/2017 0753   ALT 33 06/13/2017 0925   ALKPHOS 97 07/03/2017 0753   ALKPHOS 125 06/13/2017 0925   BILITOT 0.4 07/03/2017 0753   BILITOT 0.32 06/13/2017 0925   GFRNONAA >60 07/03/2017 0753   GFRAA >60 07/03/2017 0753    No results found for: SPEP, UPEP  Lab Results  Component Value Date   WBC 9.6 07/03/2017   NEUTROABS 8.1 (H) 07/03/2017   HGB 10.6 (L) 07/03/2017   HCT 31.3 (L) 07/03/2017   MCV 100.6 07/03/2017   PLT 179 07/03/2017      Chemistry      Component Value Date/Time   NA 138 07/03/2017 0753   NA 137 06/13/2017 0925   K 3.8 07/03/2017 0753   K 3.9 06/13/2017 0925   CL 105 07/03/2017 0753   CO2 22 07/03/2017 0753   CO2 24 06/13/2017 0925   BUN 19 07/03/2017 0753   BUN 21.0 06/13/2017 0925   CREATININE 0.80 07/03/2017 0753   CREATININE 0.8 06/13/2017 0925      Component Value Date/Time   CALCIUM 10.0 07/03/2017 0753   CALCIUM 9.7 06/13/2017 0925   ALKPHOS 97 07/03/2017 0753   ALKPHOS 125 06/13/2017 0925   AST 15 07/03/2017 0753   AST 25 06/13/2017 0925   ALT 15 07/03/2017 0753   ALT 33 06/13/2017 0925   BILITOT 0.4 07/03/2017 0753   BILITOT 0.32 06/13/2017 0925     ASSESSMENT & PLAN:  Ovarian cancer on right [facility]) She will complete last cycle of treatment today Genetic counseling is pending We discussed the risk and benefits of port removal For now, her daughter wants to keep the port I will schedule port flush every 6 weeks and blood work with tumor marker monitoring every 3 months I will schedule return appointment for her to see GYN oncologist in approximately 3 months  Chemotherapy-induced neuropathy ([place]) she has mild peripheral neuropathy, likely related to side effects of treatment. It is only mild, not  bothering the patient. I will observe for now  Anemia due to antineoplastic chemotherapy This is likely due to recent treatment. The patient denies recent history of bleeding such as epistaxis, hematuria or hematochezia. She is asymptomatic from the anemia.  I will observe for now.    No orders of the defined types were placed in this encounter.  All questions were answered. The patient knows to call the clinic with any problems, questions or concerns. No barriers to learning was detected. I spent 15 minutes counseling the patient face to face. The total time spent in the appointment was 20 minutes and more than 50% was on counseling and review of test results     Heath Lark, MD 07/03/2017 9:33 AM

## 2017-07-03 NOTE — Assessment & Plan Note (Signed)
she has mild peripheral neuropathy, likely related to side effects of treatment. It is only mild, not bothering the patient. I will observe for now 

## 2017-07-04 ENCOUNTER — Telehealth: Payer: Self-pay | Admitting: Hematology and Oncology

## 2017-07-04 LAB — CA 125: CANCER ANTIGEN (CA) 125: 51.1 U/mL — AB (ref 0.0–38.1)

## 2017-07-04 NOTE — Telephone Encounter (Signed)
Patient scheduled per 1/25 sch message. Patient aware of date and time.

## 2017-07-17 ENCOUNTER — Inpatient Hospital Stay: Payer: Medicare Other | Attending: Hematology and Oncology

## 2017-07-17 ENCOUNTER — Inpatient Hospital Stay: Payer: Medicare Other

## 2017-07-17 DIAGNOSIS — Z95828 Presence of other vascular implants and grafts: Secondary | ICD-10-CM

## 2017-07-17 DIAGNOSIS — C561 Malignant neoplasm of right ovary: Secondary | ICD-10-CM

## 2017-07-17 LAB — CBC WITH DIFFERENTIAL/PLATELET
BASOS ABS: 0 10*3/uL (ref 0.0–0.1)
BASOS PCT: 0 %
Eosinophils Absolute: 0 10*3/uL (ref 0.0–0.5)
Eosinophils Relative: 1 %
HEMATOCRIT: 30.8 % — AB (ref 34.8–46.6)
HEMOGLOBIN: 10.2 g/dL — AB (ref 11.6–15.9)
Lymphocytes Relative: 42 %
Lymphs Abs: 2.6 10*3/uL (ref 0.9–3.3)
MCH: 34 pg (ref 25.1–34.0)
MCHC: 33.1 g/dL (ref 31.5–36.0)
MCV: 102.7 fL — ABNORMAL HIGH (ref 79.5–101.0)
MONOS PCT: 8 %
Monocytes Absolute: 0.5 10*3/uL (ref 0.1–0.9)
NEUTROS ABS: 3.2 10*3/uL (ref 1.5–6.5)
NEUTROS PCT: 49 %
Platelets: 116 10*3/uL — ABNORMAL LOW (ref 145–400)
RBC: 3 MIL/uL — ABNORMAL LOW (ref 3.70–5.45)
RDW: 16.3 % — ABNORMAL HIGH (ref 11.2–14.5)
WBC: 6.3 10*3/uL (ref 3.9–10.3)

## 2017-07-17 LAB — COMPREHENSIVE METABOLIC PANEL
ALBUMIN: 3.8 g/dL (ref 3.5–5.0)
ALK PHOS: 137 U/L (ref 40–150)
ALT: 28 U/L (ref 0–55)
ANION GAP: 9 (ref 3–11)
AST: 26 U/L (ref 5–34)
BUN: 15 mg/dL (ref 7–26)
CALCIUM: 9.7 mg/dL (ref 8.4–10.4)
CO2: 26 mmol/L (ref 22–29)
Chloride: 103 mmol/L (ref 98–109)
Creatinine, Ser: 0.86 mg/dL (ref 0.60–1.10)
GFR calc Af Amer: >60 mL/min (ref >60)
GFR calc non Af Amer: 60 mL/min — ABNORMAL LOW (ref >60)
GLUCOSE: 91 mg/dL (ref 70–140)
POTASSIUM: 3.9 mmol/L (ref 3.5–5.1)
SODIUM: 138 mmol/L (ref 136–145)
Total Bilirubin: 0.4 mg/dL (ref 0.2–1.2)
Total Protein: 6.5 g/dL (ref 6.4–8.3)

## 2017-07-17 MED ORDER — SODIUM CHLORIDE 0.9% FLUSH
10.0000 mL | Freq: Once | INTRAVENOUS | Status: AC
  Administered 2017-07-17: 10 mL
  Filled 2017-07-17: qty 10

## 2017-07-17 MED ORDER — HEPARIN SOD (PORK) LOCK FLUSH 100 UNIT/ML IV SOLN
250.0000 [IU] | Freq: Once | INTRAVENOUS | Status: AC
  Administered 2017-07-17: 250 [IU]
  Filled 2017-07-17: qty 5

## 2017-07-18 ENCOUNTER — Telehealth: Payer: Self-pay | Admitting: *Deleted

## 2017-07-18 LAB — CA 125: Cancer Antigen (CA) 125: 43 U/mL — ABNORMAL HIGH (ref 0.0–38.1)

## 2017-07-18 NOTE — Telephone Encounter (Signed)
-----   Message from Heath Lark, MD sent at 07/18/2017  8:38 AM EST ----- Regarding: CA-125 PLs let her daughter know tumor marker is still improving ----- Message ----- From: Buel Ream, [facility] [place] Sent: 07/17/2017  12:02 PM To: Heath Lark, MD

## 2017-07-18 NOTE — Telephone Encounter (Signed)
Notified daughter of message below. 

## 2017-07-28 ENCOUNTER — Other Ambulatory Visit: Payer: Medicare Other

## 2017-07-28 ENCOUNTER — Encounter: Payer: Medicare Other | Admitting: Genetic Counselor

## 2017-09-15 ENCOUNTER — Telehealth: Payer: Self-pay | Admitting: *Deleted

## 2017-09-15 NOTE — Telephone Encounter (Signed)
Called and spoke with the daughter, moved the appt from April 18th to April 19th.

## 2017-09-25 ENCOUNTER — Other Ambulatory Visit: Payer: Medicare Other

## 2017-09-25 ENCOUNTER — Ambulatory Visit: Payer: Medicare Other | Admitting: Gynecologic Oncology

## 2017-09-26 ENCOUNTER — Inpatient Hospital Stay: Payer: Medicare Other

## 2017-09-26 ENCOUNTER — Inpatient Hospital Stay (HOSPITAL_BASED_OUTPATIENT_CLINIC_OR_DEPARTMENT_OTHER): Payer: Medicare Other | Admitting: Gynecologic Oncology

## 2017-09-26 ENCOUNTER — Inpatient Hospital Stay: Payer: Medicare Other | Attending: Hematology and Oncology

## 2017-09-26 ENCOUNTER — Encounter: Payer: Self-pay | Admitting: Gynecologic Oncology

## 2017-09-26 VITALS — BP 146/73 | HR 58 | Temp 97.5°F | Resp 20 | Ht 62.0 in | Wt 140.7 lb

## 2017-09-26 DIAGNOSIS — Z90710 Acquired absence of both cervix and uterus: Secondary | ICD-10-CM | POA: Insufficient documentation

## 2017-09-26 DIAGNOSIS — C786 Secondary malignant neoplasm of retroperitoneum and peritoneum: Secondary | ICD-10-CM | POA: Diagnosis not present

## 2017-09-26 DIAGNOSIS — Z90722 Acquired absence of ovaries, bilateral: Secondary | ICD-10-CM | POA: Diagnosis not present

## 2017-09-26 DIAGNOSIS — Z95828 Presence of other vascular implants and grafts: Secondary | ICD-10-CM

## 2017-09-26 DIAGNOSIS — C561 Malignant neoplasm of right ovary: Secondary | ICD-10-CM | POA: Diagnosis present

## 2017-09-26 DIAGNOSIS — Z9221 Personal history of antineoplastic chemotherapy: Secondary | ICD-10-CM | POA: Insufficient documentation

## 2017-09-26 LAB — CBC WITH DIFFERENTIAL/PLATELET
BASOS PCT: 1 %
Basophils Absolute: 0 10*3/uL (ref 0.0–0.1)
EOS ABS: 0 10*3/uL (ref 0.0–0.5)
Eosinophils Relative: 1 %
HEMATOCRIT: 32.5 % — AB (ref 34.8–46.6)
HEMOGLOBIN: 11.4 g/dL — AB (ref 11.6–15.9)
Lymphocytes Relative: 50 %
Lymphs Abs: 1.8 10*3/uL (ref 0.9–3.3)
MCH: 34.3 pg — ABNORMAL HIGH (ref 25.1–34.0)
MCHC: 35.2 g/dL (ref 31.5–36.0)
MCV: 97.6 fL (ref 79.5–101.0)
MONOS PCT: 8 %
Monocytes Absolute: 0.3 10*3/uL (ref 0.1–0.9)
NEUTROS ABS: 1.5 10*3/uL (ref 1.5–6.5)
NEUTROS PCT: 40 %
Platelets: 200 10*3/uL (ref 145–400)
RBC: 3.33 MIL/uL — AB (ref 3.70–5.45)
RDW: 12.2 % (ref 11.2–14.5)
WBC: 3.7 10*3/uL — AB (ref 3.9–10.3)

## 2017-09-26 LAB — COMPREHENSIVE METABOLIC PANEL
ALBUMIN: 3.8 g/dL (ref 3.5–5.0)
ALT: 22 U/L (ref 0–55)
ANION GAP: 8 (ref 3–11)
AST: 18 U/L (ref 5–34)
Alkaline Phosphatase: 126 U/L (ref 40–150)
BILIRUBIN TOTAL: 0.5 mg/dL (ref 0.2–1.2)
BUN: 15 mg/dL (ref 7–26)
CO2: 27 mmol/L (ref 22–29)
CREATININE: 0.85 mg/dL (ref 0.60–1.10)
Calcium: 10.2 mg/dL (ref 8.4–10.4)
Chloride: 104 mmol/L (ref 98–109)
GFR calc Af Amer: >60 mL/min (ref >60)
GFR calc non Af Amer: >60 mL/min (ref >60)
Glucose, Bld: 90 mg/dL (ref 70–140)
Potassium: 3.7 mmol/L (ref 3.5–5.1)
SODIUM: 139 mmol/L (ref 136–145)
TOTAL PROTEIN: 6.9 g/dL (ref 6.4–8.3)

## 2017-09-26 MED ORDER — HEPARIN SOD (PORK) LOCK FLUSH 100 UNIT/ML IV SOLN
500.0000 [IU] | Freq: Once | INTRAVENOUS | Status: AC | PRN
  Administered 2017-09-26: 500 [IU]
  Filled 2017-09-26: qty 5

## 2017-09-26 MED ORDER — SODIUM CHLORIDE 0.9% FLUSH
10.0000 mL | INTRAVENOUS | Status: DC | PRN
  Administered 2017-09-26: 10 mL
  Filled 2017-09-26: qty 10

## 2017-09-26 NOTE — Progress Notes (Signed)
Consult Note: Gyn-Onc  Consult was requested by Dr. Karleen Hampshire for the evaluation of Denise Bonilla 84 y.o. female  CC:  Chief Complaint  Patient presents with  . Ovarian cancer on right [facility])    Assessment/Plan:  Denise Bonilla  is a 84 y.o.  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 S/p 3 additional cycles of chemotherapy completed in February, 2019  She has had a complete response to therapy and has no measurable disease.   HPI: Denise Bonilla is a 84 year old P3 who is seen in consultation at the request of Dr Karleen Hampshire for presumed stage IV right ovarian cancer.  The patient has been having nausea and anorexia (but without weight loss or GI obstruction symptoms) since June, 2018. Her daughter took her to an ED for this where she was treated for UTI. Chest imaging had revealed a large pleural effusion. She had a small right pleural effusion.   CT chest/abdo/pelvis on 12/12/16 showed a left pleural effusion, tiny right pleural effusion. Nonspecific ill defined low density in the inferior right lobe of the liver. No bowel obstruction. Large right adnexal cystic structure measures 8.5x6.5cm with internal septations. Left ovary not well definied. No gross left adnexal mass. Uterus is abnormal in appearance for age with endometrial thinkening and fluid in the canal. Posterior heterogeneous lesion in the uterus may be fibroid. Small volume of upper abdominal ascites.   Cytology from the thoracentesis on 12/08/16 showed metastatic adenocarcinoma with positive stains for CK7, Pax 8, WT1, negative for CK 20 and ER. Overall favored gyn primary.  CA 125 on 12/13/16 was elevated at 1310 CA 19-9 was elevated at 357.  The patient has had a prior left ovarian cystectomy (possible LSO) remotely in the past for a benign ovarian cyst.  She has a  history of dementia but is currently living with her daughter and fairly independent with most activities. She is a fair historian.  She went on 2 receive 4 cycles of carb/tax chemotherapy ( 01/01/17 - 03/06/17).  She required admission after cycle 1 for nausea and emesis. She has improved PS after each cycle and is now doing well with chemotherapy.  CA 125 was 128 on 03/06/17 (day 1 of cycle 4).  CT abdo/pelvison 03/05/17 showed a persistent right ovarian cyst measuring 8.5cm with decrease in ascites, and persistent but improved left omental thickening.   Interval Hx:  She received cycle 4 of chemotherapy then underwent debulking (interval) surgery on 04/22/17 with robotic assisted total hysterectomy, BSO, and minilaparotomy for total omentectomy. Surgical findings were significant for a 7cm right ovarian cyst, and a 4cm tumor nodule in the left upper quadrant omentum. At the end of the procedure there was no gross residual disease remaining, an R0 optimal cytoreduction.  Final pathology confirmed a right ovarian high grade serous carcinoma metastatic to the omentum.  She went on to complete  3 additional cycles of adjuvant chemotherapy in February, 2019.  Genetic testing negative.   She has no symptoms concerning for recurrence.   Current Meds:  Outpatient Encounter Medications as of 09/26/2017  Medication Sig  . aspirin EC 81 MG tablet Take 81 mg by mouth daily.  . bisoprolol (ZEBETA) 10 MG tablet Take 10 mg by mouth daily.  . Cholecalciferol (VITAMIN D) 2000 units tablet Take 2,000 Units by mouth daily.  . furosemide (  LASIX) 20 MG tablet Take 1 tablet (20 mg total) by mouth daily.  Javier Docker Oil 500 MG CAPS Take 500 mg by mouth daily.  Marland Kitchen lidocaine-prilocaine (EMLA) cream Apply to affected area once (Patient taking differently: Apply 1 application topically See admin instructions. Apply to affected area once)  . Multiple Vitamins-Minerals (MULTI FOR HER 50+) TABS Take 1 tablet by mouth  daily.  . simvastatin (ZOCOR) 20 MG tablet Take 20 mg by mouth daily at 6 PM.   . acetaminophen (TYLENOL) 500 MG tablet Take 500 mg by mouth every 8 (eight) hours as needed for mild pain or headache.  . cephALEXin (KEFLEX) 500 MG capsule Take 1 capsule (500 mg total) by mouth 2 (two) times daily. (Patient not taking: Reported on 09/26/2017)  . dexamethasone (DECADRON) 4 MG tablet 5 tabs the night before and 5 tabs in the morning of chemo, take with food, every 3 weeks (Patient not taking: Reported on 09/26/2017)  . loratadine (CLARITIN) 10 MG tablet Take 10 mg by mouth daily as needed (before and after chemo treatment).  . ondansetron (ZOFRAN) 8 MG tablet Take three times a day as needed (Patient not taking: Reported on 09/26/2017)  . prochlorperazine (COMPAZINE) 10 MG tablet Take 1 tablet (10 mg total) by mouth every 6 (six) hours as needed (Nausea or vomiting). (Patient not taking: Reported on 09/26/2017)  . traMADol (ULTRAM) 50 MG tablet Take 1 tablet (50 mg total) every 6 (six) hours as needed by mouth. (Patient not taking: Reported on 09/26/2017)  . [DISCONTINUED] sodium chloride flush (NS) 0.9 % injection 10 mL    No facility-administered encounter medications on file as of 09/26/2017.     Allergy:  Allergies  Allergen Reactions  . Tetanus Toxoids Anaphylaxis  . Codeine Nausea And Vomiting    Social Hx:   Social History   Socioeconomic History  . Marital status: Widowed    Spouse name: Not on file  . Number of children: 3  . Years of education: Not on file  . Highest education level: Not on file  Occupational History  . Occupation: retired  Scientific laboratory technician  . Financial resource strain: Not on file  . Food insecurity:    Worry: Not on file    Inability: Not on file  . Transportation needs:    Medical: Not on file    Non-medical: Not on file  Tobacco Use  . Smoking status: Never Smoker  . Smokeless tobacco: Never Used  Substance and Sexual Activity  . Alcohol use: No  . Drug  use: No  . Sexual activity: Never  Lifestyle  . Physical activity:    Days per week: Not on file    Minutes per session: Not on file  . Stress: Not on file  Relationships  . Social connections:    Talks on phone: Not on file    Gets together: Not on file    Attends religious service: Not on file    Active member of club or organization: Not on file    Attends meetings of clubs or organizations: Not on file    Relationship status: Not on file  . Intimate partner violence:    Fear of current or ex partner: Not on file    Emotionally abused: Not on file    Physically abused: Not on file    Forced sexual activity: Not on file  Other Topics Concern  . Not on file  Social History Narrative  . Not on file  Past Surgical Hx:  Past Surgical History:  Procedure Laterality Date  . ABDOMINAL HYSTERECTOMY     04/22/17 Dr. Denman George  . CYSTECTOMY    . DEBULKING N/A 04/22/2017   Procedure: TUMOR DEBULKING;  Surgeon: Everitt Amber, MD;  Location: [facility];  Service: Gynecology;  Laterality: N/A;  . INNER EAR SURGERY     cyst removed  . IR FLUORO GUIDE PORT INSERTION RIGHT  02-15-2017  . IR US GUIDE VASC ACCESS RIGHT  02-15-2017  . LAPAROTOMY N/A 04/22/2017   Procedure: EXPLORATORY LAPAROTOMY;  Surgeon: Everitt Amber, MD;  Location: [facility];  Service: Gynecology;  Laterality: N/A;  . OMENTECTOMY N/A 04/22/2017   Procedure: OMENTECTOMY;  Surgeon: Everitt Amber, MD;  Location: [facility];  Service: Gynecology;  Laterality: N/A;  . ROBOTIC ASSISTED TOTAL HYSTERECTOMY WITH BILATERAL SALPINGO OOPHERECTOMY Bilateral 04/22/2017   Procedure: XI ROBOTIC ASSISTED TOTAL HYSTERECTOMY WITH BILATERAL SALPINGO OOPHORECTOMY;  Surgeon: Everitt Amber, MD;  Location[facility];  Service: Gynecology;  Laterality: Bilateral;    Past Medical Hx:  Past Medical History:  Diagnosis Date  . CHF (congestive heart failure) (HCC)    mild  . Complication of anesthesia   . Dementia   . Family history of adverse reaction to  anesthesia    Daughter naseau also  . Family history of breast cancer   . Family history of kidney cancer   . Family history of prostate cancer   . Hyperlipidemia   . Hypertension   . Malignant pleural effusion    Left  . Ovarian cancer on right ([place]) 12/23/2016  . PONV (postoperative nausea and vomiting)     Past Gynecological History:  SVDx 3 No LMP recorded. Patient is postmenopausal.  Family Hx:  Family History  Problem Relation Age of Onset  . Cancer Other   . Hyperlipidemia Other   . Hypertension Other   . Heart failure Mother   . Breast cancer Sister        dx in her 43s  . Lymphoma Father 97  . Prostate cancer Brother 96  . Kidney cancer Brother 74  . Colon cancer Maternal Aunt   . Breast cancer Sister 9  . Breast cancer Sister 57  . Prostate cancer Brother   . Breast cancer Other 7       Niece  . Cancer Other        nephew with a neuroendocrine tumor of the head/neck area  . Prostate cancer Other 62       nephew  . Melanoma Other        nephew  . Cancer Cousin        unknown cancer - pat first cousin  . Esophageal cancer Daughter 57  . Cancer Other 87       unknown cancer    Review of Systems:  Constitutional  Feels well,    ENT Normal appearing ears and nares bilaterally Skin/Breast  No rash, sores, jaundice, itching, dryness Cardiovascular  No chest pain, shortness of breath, or edema  Pulmonary  No cough or wheeze.  Gastro Intestinal  No nausea, vomitting, or diarrhoea. No bright red blood per rectum, no abdominal pain, change in bowel movement, or constipation.  Genito Urinary  No frequency, urgency, dysuria, no bleeding Musculo Skeletal  No myalgia, arthralgia, joint swelling or pain  Neurologic  No weakness, numbness, change in gait,  Psychology  No depression, anxiety, insomnia.   Vitals:  Blood pressure (!) 146/73, pulse (!) 58, temperature (!) 97.5 F (36.4  C), temperature source Oral, resp. rate 20, height _0  (1.575 m),  weight 140 lb 11.2 oz (63.8 kg), SpO2 100 %.  Physical Exam: WD in NAD Neck  Supple NROM, without any enlargements.  Lymph Node Survey No cervical supraclavicular or inguinal adenopathy Cardiovascular  Pulse normal rate, regularity and rhythm. S1 and S2 normal.  Lungs  Clear to auscultation bilateraly, without wheezes/crackles/rhonchi. Good air movement.  Skin  No rash/lesions/breakdown  Psychiatry  Alert and oriented to person, place, and time  Abdomen  Normoactive bowel sounds, abdomen soft, non-tender and nonobese without evidence of hernia. 3cm area of blanching erythema around inferior aspect of wound. No drainage or fluctuance. Back No CVA tenderness Genito Urinary  Vulva/vagina: Normal external female genitalia.  No lesions. No discharge or bleeding.  Bladder/urethra:  No lesions or masses, well supported bladder  Vagina: cuff in tact, no bleeding, no masses.  Adnexa: no palpable masses. Rectal  deferred Extremities  No bilateral cyanosis, clubbing or edema.   Thereasa Solo, MD  09/26/2017, 10:53 AM

## 2017-09-26 NOTE — Patient Instructions (Signed)
Please notify Dr Denman George at phone number [phone] if you notice vaginal bleeding, new pelvic or abdominal pains, bloating, feeling full easy, or a change in bladder or bowel function.   Please return to see Dr Denman George in 3 months.  Dr Denman George has scheduled port flushes every 3 months.

## 2017-09-27 LAB — CA 125: CANCER ANTIGEN (CA) 125: 30.1 U/mL (ref 0.0–38.1)

## 2017-10-01 ENCOUNTER — Telehealth: Payer: Self-pay | Admitting: *Deleted

## 2017-10-01 NOTE — Telephone Encounter (Signed)
Called and gave Shirlean Mylar the appt for flush at [facility]

## 2017-11-06 ENCOUNTER — Encounter (HOSPITAL_COMMUNITY)
Admission: RE | Admit: 2017-11-06 | Discharge: 2017-11-06 | Disposition: A | Discharge status: Home / Self Care | Payer: Medicare Other | Source: Ambulatory Visit | Attending: Gynecologic Oncology | Admitting: Gynecologic Oncology

## 2017-11-06 DIAGNOSIS — C561 Malignant neoplasm of right ovary: Secondary | ICD-10-CM | POA: Insufficient documentation

## 2017-11-06 MED ORDER — HEPARIN SOD (PORK) LOCK FLUSH 100 UNIT/ML IV SOLN
500.0000 [IU] | INTRAVENOUS | Status: AC | PRN
  Administered 2017-11-06: 500 [IU]

## 2017-11-06 MED ORDER — SODIUM CHLORIDE 0.9% FLUSH
10.0000 mL | INTRAVENOUS | Status: AC | PRN
  Administered 2017-11-06: 10 mL

## 2017-11-06 MED ORDER — HEPARIN SOD (PORK) LOCK FLUSH 100 UNIT/ML IV SOLN
INTRAVENOUS | Status: AC
  Filled 2017-11-06: qty 5

## 2017-12-18 ENCOUNTER — Other Ambulatory Visit: Payer: Medicare Other

## 2017-12-26 ENCOUNTER — Ambulatory Visit: Payer: Medicare Other | Admitting: Gynecologic Oncology

## 2017-12-26 ENCOUNTER — Other Ambulatory Visit: Payer: Medicare Other

## 2017-12-30 ENCOUNTER — Inpatient Hospital Stay: Payer: Medicare Other | Attending: Gynecologic Oncology

## 2017-12-30 ENCOUNTER — Inpatient Hospital Stay: Payer: Medicare Other

## 2017-12-30 ENCOUNTER — Encounter: Payer: Self-pay | Admitting: Gynecologic Oncology

## 2017-12-30 ENCOUNTER — Inpatient Hospital Stay (HOSPITAL_BASED_OUTPATIENT_CLINIC_OR_DEPARTMENT_OTHER): Payer: Medicare Other | Admitting: Gynecologic Oncology

## 2017-12-30 VITALS — BP 146/66 | HR 58 | Temp 97.8°F | Resp 20 | Ht 62.0 in | Wt 153.4 lb

## 2017-12-30 DIAGNOSIS — C561 Malignant neoplasm of right ovary: Secondary | ICD-10-CM | POA: Insufficient documentation

## 2017-12-30 DIAGNOSIS — Z9221 Personal history of antineoplastic chemotherapy: Secondary | ICD-10-CM | POA: Insufficient documentation

## 2017-12-30 DIAGNOSIS — Z90710 Acquired absence of both cervix and uterus: Secondary | ICD-10-CM | POA: Diagnosis not present

## 2017-12-30 DIAGNOSIS — F0390 Unspecified dementia without behavioral disturbance: Secondary | ICD-10-CM | POA: Insufficient documentation

## 2017-12-30 DIAGNOSIS — Z90722 Acquired absence of ovaries, bilateral: Secondary | ICD-10-CM | POA: Insufficient documentation

## 2017-12-30 DIAGNOSIS — Z95828 Presence of other vascular implants and grafts: Secondary | ICD-10-CM

## 2017-12-30 LAB — COMPREHENSIVE METABOLIC PANEL
ALK PHOS: 82 U/L (ref 38–126)
ALT: 20 U/L (ref 0–44)
AST: 20 U/L (ref 15–41)
Albumin: 4.1 g/dL (ref 3.5–5.0)
Anion gap: 11 (ref 5–15)
BILIRUBIN TOTAL: 0.8 mg/dL (ref 0.3–1.2)
BUN: 12 mg/dL (ref 8–23)
CALCIUM: 10.4 mg/dL — AB (ref 8.9–10.3)
CHLORIDE: 101 mmol/L (ref 98–111)
CO2: 27 mmol/L (ref 22–32)
CREATININE: 0.87 mg/dL (ref 0.44–1.00)
GFR, EST NON AFRICAN AMERICAN: 59 mL/min — AB (ref >60)
Glucose, Bld: 98 mg/dL (ref 70–99)
Potassium: 3.8 mmol/L (ref 3.5–5.1)
Sodium: 139 mmol/L (ref 135–145)
TOTAL PROTEIN: 6.9 g/dL (ref 6.5–8.1)

## 2017-12-30 LAB — CBC WITH DIFFERENTIAL/PLATELET
BASOS ABS: 0 10*3/uL (ref 0.0–0.1)
Basophils Relative: 1 %
EOS PCT: 1 %
Eosinophils Absolute: 0.1 10*3/uL (ref 0.0–0.5)
HEMATOCRIT: 36 % (ref 34.8–46.6)
Hemoglobin: 12.6 g/dL (ref 11.6–15.9)
LYMPHS ABS: 2.1 10*3/uL (ref 0.9–3.3)
LYMPHS PCT: 47 %
MCH: 33.1 pg (ref 25.1–34.0)
MCHC: 34.9 g/dL (ref 31.5–36.0)
MCV: 94.9 fL (ref 79.5–101.0)
Monocytes Absolute: 0.3 10*3/uL (ref 0.1–0.9)
Monocytes Relative: 8 %
NEUTROS ABS: 2 10*3/uL (ref 1.5–6.5)
Neutrophils Relative %: 43 %
PLATELETS: 163 10*3/uL (ref 145–400)
RBC: 3.79 MIL/uL (ref 3.70–5.45)
RDW: 13 % (ref 11.2–14.5)
WBC: 4.5 10*3/uL (ref 3.9–10.3)

## 2017-12-30 MED ORDER — HEPARIN SOD (PORK) LOCK FLUSH 100 UNIT/ML IV SOLN
500.0000 [IU] | Freq: Once | INTRAVENOUS | Status: AC | PRN
Start: 2017-12-30 — End: 2017-12-30
  Administered 2017-12-30: 500 [IU]
  Filled 2017-12-30: qty 5

## 2017-12-30 MED ORDER — SODIUM CHLORIDE 0.9% FLUSH
10.0000 mL | INTRAVENOUS | Status: DC | PRN
  Administered 2017-12-30: 10 mL
  Filled 2017-12-30: qty 10

## 2017-12-30 NOTE — Progress Notes (Signed)
Follow-up Note: Gyn-Onc  Consult was requested by Dr. Karleen Hampshire for the evaluation of Denise Bonilla 84 y.o. female  CC:  Chief Complaint  Patient presents with  . Malignant neoplasm of right ovary [facility])    Assessment/Plan:  Denise Bonilla  is a 84 y.o.  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 S/p 3 additional cycles of chemotherapy completed in February, 2019  She has had a complete response to therapy and has no measurable disease.   CA 125 pending.   Will see her back in 3 months for follow-up  HPI: Denise Bonilla is a 84 year old P3 who is seen in consultation at the request of Dr Karleen Hampshire for presumed stage IV right ovarian cancer.  The patient has been having nausea and anorexia (but without weight loss or GI obstruction symptoms) since June, 2018. Her daughter took her to an ED for this where she was treated for UTI. Chest imaging had revealed a large pleural effusion. She had a small right pleural effusion.   CT chest/abdo/pelvis on 12/12/16 showed a left pleural effusion, tiny right pleural effusion. Nonspecific ill defined low density in the inferior right lobe of the liver. No bowel obstruction. Large right adnexal cystic structure measures 8.5x6.5cm with internal septations. Left ovary not well definied. No gross left adnexal mass. Uterus is abnormal in appearance for age with endometrial thinkening and fluid in the canal. Posterior heterogeneous lesion in the uterus may be fibroid. Small volume of upper abdominal ascites.   Cytology from the thoracentesis on 12/08/16 showed metastatic adenocarcinoma with positive stains for CK7, Pax 8, WT1, negative for CK 20 and ER. Overall favored gyn primary.  CA 125 on 12/13/16 was elevated at 1310 CA 19-9 was elevated at 357.  The patient has had a prior left ovarian cystectomy  (possible LSO) remotely in the past for a benign ovarian cyst.  She has a history of dementia but is currently living with her daughter and fairly independent with most activities. She is a fair historian.  She went on 2 receive 4 cycles of carb/tax chemotherapy ( 01/01/17 - 03/06/17).  She required admission after cycle 1 for nausea and emesis. She has improved PS after each cycle and is now doing well with chemotherapy.  CA 125 was 128 on 03/06/17 (day 1 of cycle 4).  CT abdo/pelvison 03/05/17 showed a persistent right ovarian cyst measuring 8.5cm with decrease in ascites, and persistent but improved left omental thickening.   Interval Hx:  She received cycle 4 of chemotherapy then underwent debulking (interval) surgery on 04/22/17 with robotic assisted total hysterectomy, BSO, and minilaparotomy for total omentectomy. Surgical findings were significant for a 7cm right ovarian cyst, and a 4cm tumor nodule in the left upper quadrant omentum. At the end of the procedure there was no gross residual disease remaining, an R0 optimal cytoreduction.  Final pathology confirmed a right ovarian high grade serous carcinoma metastatic to the omentum.  She went on to complete  3 additional cycles of adjuvant chemotherapy in February, 2019.  Genetic testing negative.   She has no symptoms concerning for recurrence.   Current Meds:  Outpatient Encounter Medications as of 12/30/2017  Medication Sig  . aspirin EC 81 MG tablet Take 81 mg by mouth daily.  . bisoprolol (ZEBETA) 10 MG tablet Take 10 mg by mouth daily.  Marland Kitchen  Cholecalciferol (VITAMIN D) 2000 units tablet Take 2,000 Units by mouth daily.  . furosemide (LASIX) 20 MG tablet Take 1 tablet (20 mg total) by mouth daily.  Javier Docker Oil 500 MG CAPS Take 500 mg by mouth daily.  Marland Kitchen lidocaine-prilocaine (EMLA) cream Apply to affected area once (Patient taking differently: Apply 1 application topically See admin instructions. Apply to affected area once)  .  Multiple Vitamins-Minerals (MULTI FOR HER 50+) TABS Take 1 tablet by mouth daily.  . simvastatin (ZOCOR) 20 MG tablet Take 20 mg by mouth daily at 6 PM.   . [DISCONTINUED] acetaminophen (TYLENOL) 500 MG tablet Take 500 mg by mouth every 8 (eight) hours as needed for mild pain or headache.  . [DISCONTINUED] cephALEXin (KEFLEX) 500 MG capsule Take 1 capsule (500 mg total) by mouth 2 (two) times daily. (Patient not taking: Reported on 09/26/2017)  . [DISCONTINUED] dexamethasone (DECADRON) 4 MG tablet 5 tabs the night before and 5 tabs in the morning of chemo, take with food, every 3 weeks (Patient not taking: Reported on 09/26/2017)  . [DISCONTINUED] loratadine (CLARITIN) 10 MG tablet Take 10 mg by mouth daily as needed (before and after chemo treatment).  . [DISCONTINUED] ondansetron (ZOFRAN) 8 MG tablet Take three times a day as needed (Patient not taking: Reported on 09/26/2017)  . [DISCONTINUED] prochlorperazine (COMPAZINE) 10 MG tablet Take 1 tablet (10 mg total) by mouth every 6 (six) hours as needed (Nausea or vomiting). (Patient not taking: Reported on 09/26/2017)  . [DISCONTINUED] traMADol (ULTRAM) 50 MG tablet Take 1 tablet (50 mg total) every 6 (six) hours as needed by mouth. (Patient not taking: Reported on 09/26/2017)  . [DISCONTINUED] sodium chloride flush (NS) 0.9 % injection 10 mL    No facility-administered encounter medications on file as of 12/30/2017.     Allergy:  Allergies  Allergen Reactions  . Tetanus Toxoids Anaphylaxis  . Codeine Nausea And Vomiting    Social Hx:   Social History   Socioeconomic History  . Marital status: Widowed    Spouse name: Not on file  . Number of children: 3  . Years of education: Not on file  . Highest education level: Not on file  Occupational History  . Occupation: retired  Scientific laboratory technician  . Financial resource strain: Not on file  . Food insecurity:    Worry: Not on file    Inability: Not on file  . Transportation needs:    Medical: Not  on file    Non-medical: Not on file  Tobacco Use  . Smoking status: Never Smoker  . Smokeless tobacco: Never Used  Substance and Sexual Activity  . Alcohol use: No  . Drug use: No  . Sexual activity: Never  Lifestyle  . Physical activity:    Days per week: Not on file    Minutes per session: Not on file  . Stress: Not on file  Relationships  . Social connections:    Talks on phone: Not on file    Gets together: Not on file    Attends religious service: Not on file    Active member of club or organization: Not on file    Attends meetings of clubs or organizations: Not on file    Relationship status: Not on file  . Intimate partner violence:    Fear of current or ex partner: Not on file    Emotionally abused: Not on file    Physically abused: Not on file    Forced sexual activity: Not  on file  Other Topics Concern  . Not on file  Social History Narrative  . Not on file    Past Surgical Hx:  Past Surgical History:  Procedure Laterality Date  . ABDOMINAL HYSTERECTOMY     04/22/17 Dr. Denman George  . CYSTECTOMY    . DEBULKING N/A 04/22/2017   Procedure: TUMOR DEBULKING;  Surgeon: Everitt Amber, MD;  Location: [facility];  Service: Gynecology;  Laterality: N/A;  . INNER EAR SURGERY     cyst removed  . IR FLUORO GUIDE PORT INSERTION RIGHT  Sep 06, 202018  . IR US GUIDE VASC ACCESS RIGHT  Sep 06, 202018  . LAPAROTOMY N/A 04/22/2017   Procedure: EXPLORATORY LAPAROTOMY;  Surgeon: Everitt Amber, MD;  Location: [facility];  Service: Gynecology;  Laterality: N/A;  . OMENTECTOMY N/A 04/22/2017   Procedure: OMENTECTOMY;  Surgeon: Everitt Amber, MD;  Location: [facility];  Service: Gynecology;  Laterality: N/A;  . ROBOTIC ASSISTED TOTAL HYSTERECTOMY WITH BILATERAL SALPINGO OOPHERECTOMY Bilateral 04/22/2017   Procedure: XI ROBOTIC ASSISTED TOTAL HYSTERECTOMY WITH BILATERAL SALPINGO OOPHORECTOMY;  Surgeon: Everitt Amber, MD;  Location: [facility];  Service: Gynecology;  Laterality: Bilateral;    Past Medical Hx:  Past  Medical History:  Diagnosis Date  . CHF (congestive heart failure) (HCC)    mild  . Complication of anesthesia   . Dementia   . Family history of adverse reaction to anesthesia    Daughter naseau also  . Family history of breast cancer   . Family history of kidney cancer   . Family history of prostate cancer   . Hyperlipidemia   . Hypertension   . Malignant pleural effusion    Left  . Ovarian cancer on right ([place]) 12/23/2016  . PONV (postoperative nausea and vomiting)     Past Gynecological History:  SVDx 3 No LMP recorded. Patient is postmenopausal.  Family Hx:  Family History  Problem Relation Age of Onset  . Cancer Other   . Hyperlipidemia Other   . Hypertension Other   . Heart failure Mother   . Breast cancer Sister        dx in her 65s  . Lymphoma Father 72  . Prostate cancer Brother 35  . Kidney cancer Brother 64  . Colon cancer Maternal Aunt   . Breast cancer Sister 61  . Breast cancer Sister 61  . Prostate cancer Brother   . Breast cancer Other 85       Niece  . Cancer Other        nephew with a neuroendocrine tumor of the head/neck area  . Prostate cancer Other 25       nephew  . Melanoma Other        nephew  . Cancer Cousin        unknown cancer - pat first cousin  . Esophageal cancer Daughter 35  . Cancer Other 55       unknown cancer    Review of Systems:  Constitutional  Feels well,    ENT Normal appearing ears and nares bilaterally Skin/Breast  No rash, sores, jaundice, itching, dryness Cardiovascular  No chest pain, shortness of breath, or edema  Pulmonary  No cough or wheeze.  Gastro Intestinal  No nausea, vomitting, or diarrhoea. No bright red blood per rectum, no abdominal pain, change in bowel movement, or constipation.  Genito Urinary  No frequency, urgency, dysuria, no bleeding Musculo Skeletal  No myalgia, arthralgia, joint swelling or pain  Neurologic  No weakness, numbness, change in gait,  Psychology  No depression,  anxiety, insomnia.   Vitals:  Blood pressure (!) 146/66, pulse (!) 58, temperature 97.8 F (36.6 C), temperature source Oral, resp. rate 20, height _0  (1.575 m), weight 153 lb 6.4 oz (69.6 kg), SpO2 100 %.  Physical Exam: WD in NAD Neck  Supple NROM, without any enlargements.  Lymph Node Survey No cervical supraclavicular or inguinal adenopathy Cardiovascular  Pulse normal rate, regularity and rhythm. S1 and S2 normal.  Lungs  Clear to auscultation bilateraly, without wheezes/crackles/rhonchi. Good air movement.  Skin  No rash/lesions/breakdown  Psychiatry  Alert and oriented to person, place, and time  Abdomen  Normoactive bowel sounds, abdomen soft, non-tender and nonobese without evidence of hernia. No masses. Back No CVA tenderness Genito Urinary  Vulva/vagina: Normal external female genitalia.  No lesions. No discharge or bleeding.  Bladder/urethra:  No lesions or masses, well supported bladder  Vagina: cuff in tact, no bleeding, no masses.  Adnexa: no palpable masses. Rectal  deferred Extremities  No bilateral cyanosis, clubbing or edema.   Thereasa Solo, MD  12/30/2017, 2:05 PM

## 2017-12-30 NOTE — Patient Instructions (Signed)
Please notify Dr Denman George at phone number [phone] if you notice vaginal bleeding, new pelvic or abdominal pains, bloating, feeling full easy, or a change in bladder or bowel function.   Please return to see Dr Denman George in 3 months for a CA 125 and clinic visit.

## 2017-12-31 ENCOUNTER — Telehealth: Payer: Self-pay | Admitting: *Deleted

## 2017-12-31 LAB — CA 125: Cancer Antigen (CA) 125: 31.5 U/mL (ref 0.0–38.1)

## 2017-12-31 NOTE — Telephone Encounter (Signed)
Faxed orders for lab/flush to [facility]

## 2018-02-10 ENCOUNTER — Encounter (HOSPITAL_COMMUNITY)
Admission: RE | Admit: 2018-02-10 | Discharge: 2018-02-10 | Disposition: A | Discharge status: Home / Self Care | Payer: Medicare Other | Source: Ambulatory Visit | Attending: Gynecologic Oncology | Admitting: Gynecologic Oncology

## 2018-02-10 DIAGNOSIS — C561 Malignant neoplasm of right ovary: Secondary | ICD-10-CM | POA: Insufficient documentation

## 2018-02-10 DIAGNOSIS — Z95828 Presence of other vascular implants and grafts: Secondary | ICD-10-CM | POA: Insufficient documentation

## 2018-02-10 MED ORDER — SODIUM CHLORIDE 0.9% FLUSH: 10.0000 mL | INTRAVENOUS | Status: DC | PRN

## 2018-02-10 MED ORDER — HEPARIN SOD (PORK) LOCK FLUSH 100 UNIT/ML IV SOLN
INTRAVENOUS | Status: AC
  Filled 2018-02-10: qty 5

## 2018-02-10 MED ORDER — HEPARIN SOD (PORK) LOCK FLUSH 100 UNIT/ML IV SOLN
500.0000 [IU] | INTRAVENOUS | Status: AC | PRN
  Administered 2018-02-10: 500 [IU]

## 2018-02-10 NOTE — Progress Notes (Signed)
Port a cath accessed and blood return noted. Port flushed easily. 500 units of heparin placed in port. Patient tolerated procedure well.

## 2018-03-17 ENCOUNTER — Encounter (HOSPITAL_COMMUNITY): Payer: Self-pay

## 2018-03-17 ENCOUNTER — Encounter (HOSPITAL_COMMUNITY)
Admission: RE | Admit: 2018-03-17 | Discharge: 2018-03-17 | Disposition: A | Discharge status: Home / Self Care | Payer: Medicare Other | Source: Ambulatory Visit | Attending: Gynecologic Oncology | Admitting: Gynecologic Oncology

## 2018-03-17 DIAGNOSIS — C561 Malignant neoplasm of right ovary: Secondary | ICD-10-CM | POA: Diagnosis not present

## 2018-03-17 MED ORDER — HEPARIN SOD (PORK) LOCK FLUSH 100 UNIT/ML IV SOLN
500.0000 [IU] | Freq: Once | INTRAVENOUS | Status: AC
  Administered 2018-03-17: 500 [IU] via INTRAVENOUS

## 2018-03-18 LAB — CA 125: CANCER ANTIGEN (CA) 125: 36.2 U/mL (ref 0.0–38.1)

## 2018-03-23 ENCOUNTER — Inpatient Hospital Stay: Payer: Medicare Other | Attending: Gynecologic Oncology | Admitting: Gynecologic Oncology

## 2018-03-23 ENCOUNTER — Encounter: Payer: Self-pay | Admitting: Gynecologic Oncology

## 2018-03-23 VITALS — BP 130/64 | HR 58 | Temp 98.2°F | Ht 62.0 in | Wt 156.9 lb

## 2018-03-23 DIAGNOSIS — Z9221 Personal history of antineoplastic chemotherapy: Secondary | ICD-10-CM | POA: Diagnosis not present

## 2018-03-23 DIAGNOSIS — C561 Malignant neoplasm of right ovary: Secondary | ICD-10-CM | POA: Diagnosis not present

## 2018-03-23 DIAGNOSIS — Z923 Personal history of irradiation: Secondary | ICD-10-CM

## 2018-03-23 DIAGNOSIS — Z90710 Acquired absence of both cervix and uterus: Secondary | ICD-10-CM

## 2018-03-23 DIAGNOSIS — Z90722 Acquired absence of ovaries, bilateral: Secondary | ICD-10-CM

## 2018-03-23 NOTE — Progress Notes (Signed)
Follow-up Note: Gyn-Onc  Consult was requested by Dr. Karleen Hampshire for the evaluation of Denise Bonilla 84 y.o. female  CC:  Chief Complaint  Patient presents with  . Malignant neoplasm of right ovary [facility])    Assessment/Plan:  Denise Bonilla  is a 84 y.o.  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 S/p 3 additional cycles of chemotherapy completed in February, 2019  She has had a complete response to therapy and has no measurable disease.   CA 125 stable, upper limit of normal. I explained to her daughter that the drift in CA 125 is probably indicative of early recurrence, however, given that she is symptom free without doubling of CA 125, it is most reasonable to hold off treatment for now.   Will see her back in 3 months for follow-up with CA 125 assessment prior to appointment.   HPI: Denise Bonilla is a 84 year old P3 who is seen in consultation at the request of Dr Karleen Hampshire for presumed stage IV right ovarian cancer.  The patient has been having nausea and anorexia (but without weight loss or GI obstruction symptoms) since June, 2018. Her daughter took her to an ED for this where she was treated for UTI. Chest imaging had revealed a large pleural effusion. She had a small right pleural effusion.   CT chest/abdo/pelvis on 12/12/16 showed a left pleural effusion, tiny right pleural effusion. Nonspecific ill defined low density in the inferior right lobe of the liver. No bowel obstruction. Large right adnexal cystic structure measures 8.5x6.5cm with internal septations. Left ovary not well definied. No gross left adnexal mass. Uterus is abnormal in appearance for age with endometrial thinkening and fluid in the canal. Posterior heterogeneous lesion in the uterus may be fibroid. Small volume of upper abdominal ascites.   Cytology from  the thoracentesis on 12/08/16 showed metastatic adenocarcinoma with positive stains for CK7, Pax 8, WT1, negative for CK 20 and ER. Overall favored gyn primary.  CA 125 on 12/13/16 was elevated at 1310 CA 19-9 was elevated at 357.  The patient has had a prior left ovarian cystectomy (possible LSO) remotely in the past for a benign ovarian cyst.  She has a history of dementia but is currently living with her daughter and fairly independent with most activities. She is a fair historian.  She went on 2 receive 4 cycles of carb/tax chemotherapy ( 01/01/17 - 03/06/17).  She required admission after cycle 1 for nausea and emesis. She has improved PS after each cycle and is now doing well with chemotherapy.  CA 125 was 128 on 03/06/17 (day 1 of cycle 4).  CT abdo/pelvison 03/05/17 showed a persistent right ovarian cyst measuring 8.5cm with decrease in ascites, and persistent but improved left omental thickening.    She received cycle 4 of chemotherapy then underwent debulking (interval) surgery on 04/22/17 with robotic assisted total hysterectomy, BSO, and minilaparotomy for total omentectomy. Surgical findings were significant for a 7cm right ovarian cyst, and a 4cm tumor nodule in the left upper quadrant omentum. At the end of the procedure there was no gross residual disease remaining, an R0 optimal cytoreduction.  Final pathology confirmed a right ovarian high grade serous carcinoma metastatic to the omentum.  She went on to complete  3 additional cycles of adjuvant chemotherapy in February, 2019.  Genetic testing negative.  Interval Hx:  CA 125 on 12/30/17 normal at 31.5. CA 125 on 03/17/18 normal at 26.2.  She has no symptoms concerning for recurrence.   Current Meds:  Outpatient Encounter Medications as of 03/23/2018  Medication Sig  . Ascorbic Acid (VITAMIN C) 1000 MG tablet Take 1,000 mg by mouth daily.  Marland Kitchen aspirin EC 81 MG tablet Take 81 mg by mouth daily.  . bisoprolol (ZEBETA) 10 MG  tablet Take 10 mg by mouth daily.  . Cholecalciferol (VITAMIN D) 2000 units tablet Take 2,000 Units by mouth daily.  . furosemide (LASIX) 20 MG tablet Take 1 tablet (20 mg total) by mouth daily.  Javier Docker Oil 500 MG CAPS Take 500 mg by mouth daily.  Marland Kitchen lidocaine-prilocaine (EMLA) cream Apply to affected area once (Patient taking differently: Apply 1 application topically See admin instructions. Apply to affected area once)  . Multiple Vitamins-Minerals (MULTI FOR HER 50+) TABS Take 1 tablet by mouth daily.  . simvastatin (ZOCOR) 20 MG tablet Take 20 mg by mouth daily at 6 PM.    No facility-administered encounter medications on file as of 03/23/2018.     Allergy:  Allergies  Allergen Reactions  . Tetanus Toxoids Anaphylaxis  . Codeine Nausea And Vomiting    Social Hx:   Social History   Socioeconomic History  . Marital status: Widowed    Spouse name: Not on file  . Number of children: 3  . Years of education: Not on file  . Highest education level: Not on file  Occupational History  . Occupation: retired  Scientific laboratory technician  . Financial resource strain: Not on file  . Food insecurity:    Worry: Not on file    Inability: Not on file  . Transportation needs:    Medical: Not on file    Non-medical: Not on file  Tobacco Use  . Smoking status: Never Smoker  . Smokeless tobacco: Never Used  Substance and Sexual Activity  . Alcohol use: No  . Drug use: No  . Sexual activity: Never  Lifestyle  . Physical activity:    Days per week: Not on file    Minutes per session: Not on file  . Stress: Not on file  Relationships  . Social connections:    Talks on phone: Not on file    Gets together: Not on file    Attends religious service: Not on file    Active member of club or organization: Not on file    Attends meetings of clubs or organizations: Not on file    Relationship status: Not on file  . Intimate partner violence:    Fear of current or ex partner: Not on file     Emotionally abused: Not on file    Physically abused: Not on file    Forced sexual activity: Not on file  Other Topics Concern  . Not on file  Social History Narrative  . Not on file    Past Surgical Hx:  Past Surgical History:  Procedure Laterality Date  . ABDOMINAL HYSTERECTOMY     04/22/17 Dr. Denman George  . CYSTECTOMY    . DEBULKING N/A 04/22/2017   Procedure: TUMOR DEBULKING;  Surgeon: Everitt Amber, MD;  Location: [facility];  Service: Gynecology;  Laterality: N/A;  . INNER EAR SURGERY     cyst removed  . IR FLUORO GUIDE PORT INSERTION RIGHT  May 20, 202018  . IR US GUIDE VASC ACCESS RIGHT  May 20, 202018  . LAPAROTOMY N/A 04/22/2017   Procedure: EXPLORATORY  LAPAROTOMY;  Surgeon: Everitt Amber, MD;  Location: [facility];  Service: Gynecology;  Laterality: N/A;  . OMENTECTOMY N/A 04/22/2017   Procedure: OMENTECTOMY;  Surgeon: Everitt Amber, MD;  Location: [facility];  Service: Gynecology;  Laterality: N/A;  . ROBOTIC ASSISTED TOTAL HYSTERECTOMY WITH BILATERAL SALPINGO OOPHERECTOMY Bilateral 04/22/2017   Procedure: XI ROBOTIC ASSISTED TOTAL HYSTERECTOMY WITH BILATERAL SALPINGO OOPHORECTOMY;  Surgeon: Everitt Amber, MD;  Location: [facility];  Service: Gynecology;  Laterality: Bilateral;    Past Medical Hx:  Past Medical History:  Diagnosis Date  . CHF (congestive heart failure) (HCC)    mild  . Complication of anesthesia   . Dementia ([place])   . Family history of adverse reaction to anesthesia    Daughter naseau also  . Family history of breast cancer   . Family history of kidney cancer   . Family history of prostate cancer   . Hyperlipidemia   . Hypertension   . Malignant pleural effusion    Left  . Ovarian cancer on right ([place]) 12/23/2016  . PONV (postoperative nausea and vomiting)     Past Gynecological History:  SVDx 3 No LMP recorded. Patient is postmenopausal.  Family Hx:  Family History  Problem Relation Age of Onset  . Cancer Other   . Hyperlipidemia Other   . Hypertension Other   . Heart  failure Mother   . Breast cancer Sister        dx in her 32s  . Lymphoma Father 55  . Prostate cancer Brother 16  . Kidney cancer Brother 67  . Colon cancer Maternal Aunt   . Breast cancer Sister 38  . Breast cancer Sister 40  . Prostate cancer Brother   . Breast cancer Other 37       Niece  . Cancer Other        nephew with a neuroendocrine tumor of the head/neck area  . Prostate cancer Other 72       nephew  . Melanoma Other        nephew  . Cancer Cousin        unknown cancer - pat first cousin  . Esophageal cancer Daughter 101  . Cancer Other 30       unknown cancer    Review of Systems:  Constitutional  Feels well,    ENT Normal appearing ears and nares bilaterally Skin/Breast  No rash, sores, jaundice, itching, dryness Cardiovascular  No chest pain, shortness of breath, or edema  Pulmonary  No cough or wheeze.  Gastro Intestinal  No nausea, vomitting, or diarrhoea. No bright red blood per rectum, no abdominal pain, change in bowel movement, or constipation.  Genito Urinary  No frequency, urgency, dysuria, no bleeding Musculo Skeletal  No myalgia, arthralgia, joint swelling or pain  Neurologic  No weakness, numbness, change in gait,  Psychology  No depression, anxiety, insomnia.   Vitals:  Blood pressure 130/64, pulse (!) 58, temperature 98.2 F (36.8 C), temperature source Oral, height '5\' 2"'  (1.575 m), weight 156 lb 14.4 oz (71.2 kg), SpO2 97 %.  Physical Exam: WD in NAD Neck  Supple NROM, without any enlargements.  Lymph Node Survey No cervical supraclavicular or inguinal adenopathy Cardiovascular  Pulse normal rate, regularity and rhythm. S1 and S2 normal.  Lungs  Clear to auscultation bilateraly, without wheezes/crackles/rhonchi. Good air movement.  Skin  No rash/lesions/breakdown  Psychiatry  Alert and oriented to person, place, and time  Abdomen  Normoactive bowel sounds, abdomen soft, non-tender and nonobese  without evidence of hernia. No  masses. Back No CVA tenderness Genito Urinary  Vulva/vagina: Normal external female genitalia.  No lesions. No discharge or bleeding.  Bladder/urethra:  No lesions or masses, well supported bladder  Vagina: cuff in tact, no bleeding, no masses.  Adnexa: no palpable masses. Rectal  deferred Extremities  No bilateral cyanosis, clubbing or edema.   Thereasa Solo, MD  03/23/2018, 1:54 PM

## 2018-03-23 NOTE — Patient Instructions (Signed)
Please notify Dr Denman George at phone number [phone] if you notice vaginal bleeding, new pelvic or abdominal pains, bloating, feeling full easy, or a change in bladder or bowel function.   Please contact Dr Serita Grit office after October 25th to schedule an appointment with her in January, 2020. Ask for a lab appointment at [facility] the preceding week.

## 2018-04-06 ENCOUNTER — Telehealth: Payer: Self-pay | Admitting: *Deleted

## 2018-04-06 NOTE — Telephone Encounter (Signed)
Called and scheduled the lab/flush appt for the patient. Called and spoke with the daughter and advised her that the appt will notify her in my chart

## 2018-05-05 ENCOUNTER — Other Ambulatory Visit: Payer: Self-pay | Admitting: *Deleted

## 2018-05-05 ENCOUNTER — Encounter: Payer: Self-pay | Admitting: Oncology

## 2018-05-05 ENCOUNTER — Other Ambulatory Visit: Payer: Self-pay | Admitting: Oncology

## 2018-05-05 DIAGNOSIS — Z95828 Presence of other vascular implants and grafts: Secondary | ICD-10-CM

## 2018-05-05 DIAGNOSIS — C561 Malignant neoplasm of right ovary: Secondary | ICD-10-CM

## 2018-05-05 MED ORDER — HEPARIN SOD (PORK) LOCK FLUSH 100 UNIT/ML IV SOLN
500.0000 [IU] | Freq: Once | INTRAVENOUS | Status: AC
  Filled 2018-05-05: qty 5

## 2018-05-05 MED ORDER — SODIUM CHLORIDE 0.9% FLUSH
10.0000 mL | INTRAVENOUS | Status: AC | PRN
  Filled 2018-05-05: qty 10

## 2018-05-11 ENCOUNTER — Encounter (HOSPITAL_COMMUNITY)
Admission: RE | Admit: 2018-05-11 | Discharge: 2018-05-11 | Disposition: A | Discharge status: Home / Self Care | Payer: Medicare Other | Source: Ambulatory Visit | Attending: Gynecologic Oncology | Admitting: Gynecologic Oncology

## 2018-05-11 ENCOUNTER — Encounter (HOSPITAL_COMMUNITY): Payer: Self-pay

## 2018-05-11 ENCOUNTER — Other Ambulatory Visit: Payer: Self-pay

## 2018-05-11 DIAGNOSIS — C561 Malignant neoplasm of right ovary: Secondary | ICD-10-CM | POA: Insufficient documentation

## 2018-05-11 MED ORDER — SODIUM CHLORIDE 0.9% FLUSH
10.0000 mL | INTRAVENOUS | Status: DC | PRN
  Filled 2018-05-11: qty 10

## 2018-05-11 MED ORDER — HEPARIN SOD (PORK) LOCK FLUSH 100 UNIT/ML IV SOLN
500.0000 [IU] | Freq: Once | INTRAVENOUS | Status: DC
  Filled 2018-05-11: qty 5

## 2018-05-11 MED ORDER — SODIUM CHLORIDE 0.9% FLUSH: 10.0000 mL | Freq: Once | INTRAVENOUS | Status: DC

## 2018-05-11 MED ORDER — HEPARIN SOD (PORK) LOCK FLUSH 100 UNIT/ML IV SOLN
500.0000 [IU] | Freq: Once | INTRAVENOUS | Status: AC
  Administered 2018-05-11: 500 [IU] via INTRAVENOUS

## 2018-05-11 NOTE — Progress Notes (Signed)
Opened in error

## 2018-06-16 ENCOUNTER — Emergency Department (HOSPITAL_COMMUNITY)
Admission: EM | Admit: 2018-06-16 | Discharge: 2018-06-16 | Disposition: A | Discharge status: Home / Self Care | Payer: Medicare Other | Attending: Emergency Medicine | Admitting: Emergency Medicine

## 2018-06-16 ENCOUNTER — Other Ambulatory Visit: Payer: Self-pay

## 2018-06-16 ENCOUNTER — Encounter (HOSPITAL_COMMUNITY): Payer: Self-pay

## 2018-06-16 DIAGNOSIS — R112 Nausea with vomiting, unspecified: Secondary | ICD-10-CM

## 2018-06-16 DIAGNOSIS — I110 Hypertensive heart disease with heart failure: Secondary | ICD-10-CM | POA: Insufficient documentation

## 2018-06-16 DIAGNOSIS — F0390 Unspecified dementia without behavioral disturbance: Secondary | ICD-10-CM | POA: Insufficient documentation

## 2018-06-16 DIAGNOSIS — Z7982 Long term (current) use of aspirin: Secondary | ICD-10-CM | POA: Diagnosis not present

## 2018-06-16 DIAGNOSIS — I509 Heart failure, unspecified: Secondary | ICD-10-CM | POA: Insufficient documentation

## 2018-06-16 DIAGNOSIS — Z79899 Other long term (current) drug therapy: Secondary | ICD-10-CM | POA: Diagnosis not present

## 2018-06-16 MED ORDER — ONDANSETRON 8 MG PO TBDP
8.0000 mg | ORAL_TABLET | Freq: Once | ORAL | Status: AC
  Administered 2018-06-16: 8 mg via ORAL
  Filled 2018-06-16: qty 1

## 2018-06-16 NOTE — ED Notes (Signed)
Daughter arrived at bedside.

## 2018-06-16 NOTE — ED Triage Notes (Addendum)
Pt arrived via ems from home c/o n/v starting today. Took zofran at 430pm.  Pt has short term memory issues and hx of dementia. Family following behind ems.    Slightly orthostatic with ems.

## 2018-06-16 NOTE — Discharge Instructions (Addendum)
Start with a clear liquid diet, 12 to 24 hours.  After that begin a bland diet.  Use Tylenol if needed for pain or fever.  See your doctor or return here for problems.

## 2018-06-16 NOTE — ED Provider Notes (Signed)
[facility] EMERGENCY DEPARTMENT Provider Note   CSN: [id number] Arrival date & time: 06/16/18  7829     History   Chief Complaint Chief Complaint  Patient presents with  . Nausea    HPI Denise Bonilla is a 85 y.o. female.  HPI   She presents for evaluation of nausea with vomiting which started today.  Emesis has been the color of what ever she is eating or drinking.  She is tolerating some liquids but vomits.  There is been no report of pain.  She is not dizzy.  She is here with her daughter who gives history.  She lives with her daughter.  She has been exposed to some people with a similar illness.  There is been no cough, falls, change in behavior.  She has a prescription for Zofran but has not started it yet.  There are no other known modifying factors.  Past Medical History:  Diagnosis Date  . CHF (congestive heart failure) (HCC)    mild  . Complication of anesthesia   . Dementia ([place])   . Family history of adverse reaction to anesthesia    Daughter naseau also  . Family history of breast cancer   . Family history of kidney cancer   . Family history of prostate cancer   . Hyperlipidemia   . Hypertension   . Malignant pleural effusion    Left  . Ovarian cancer on right ([place]) 12/23/2016  . PONV (postoperative nausea and vomiting)     Patient Active Problem List   Diagnosis Date Noted  . Chemotherapy-induced neuropathy ([place]) 07/03/2017  . Family history of breast cancer   . Family history of prostate cancer   . Family history of kidney cancer   . Acquired pancytopenia ([place]) 05/23/2017  . Ovarian cancer ([place]) 04/22/2017  . Anemia due to antineoplastic chemotherapy 03/27/2017  . Weight loss 01/23/2017  . Chemotherapy-induced nausea 01/23/2017  . Port catheter in place 01/22/2017  . Dementia ([place]) 01/08/2017  . Vomiting 01/07/2017  . Ovarian cancer on right ([place]) 12/23/2016  . Goals of care, counseling/discussion 12/23/2016  . Chronic constipation 12/23/2016  .  Hyponatremia 12/07/2016  . Ascites 12/07/2016  . HTN (hypertension) 12/07/2016  . Multiple thyroid nodules 12/07/2016  . Pleural effusion due to CHF (congestive heart failure) ([place]) 12/07/2016    Past Surgical History:  Procedure Laterality Date  . ABDOMINAL HYSTERECTOMY     04/22/17 Dr. Denman George  . CYSTECTOMY    . DEBULKING N/A 04/22/2017   Procedure: TUMOR DEBULKING;  Surgeon: Everitt Amber, MD;  [facility];  Service: Gynecology;  Laterality: N/A;  . INNER EAR SURGERY     cyst removed  . IR FLUORO GUIDE PORT INSERTION RIGHT  Feb 07, 202018  . IR US GUIDE VASC ACCESS RIGHT  Feb 07, 202018  . LAPAROTOMY N/A 04/22/2017   Procedure: EXPLORATORY LAPAROTOMY;  Surgeon: Everitt Amber, MD;  [facility];  Service: Gynecology;  Laterality: N/A;  . OMENTECTOMY N/A 04/22/2017   Procedure: OMENTECTOMY;  Surgeon: Everitt Amber, MD;  [facility];  Service: Gynecology;  Laterality: N/A;  . ROBOTIC ASSISTED TOTAL HYSTERECTOMY WITH BILATERAL SALPINGO OOPHERECTOMY Bilateral 04/22/2017   Procedure: XI ROBOTIC ASSISTED TOTAL HYSTERECTOMY WITH BILATERAL SALPINGO OOPHORECTOMY;  Surgeon: Everitt Amber, MD;  [facility];  Service: Gynecology;  Laterality: Bilateral;     OB History    Gravida  3   Para  3   Term  3   Preterm      AB  Living  3     SAB      TAB      Ectopic      Multiple      Live Births               Home Medications    Prior to Admission medications   Medication Sig Start Date End Date Taking? Authorizing Provider  Ascorbic Acid (VITAMIN C) 1000 MG tablet Take 1,000 mg by mouth daily.    [provider]  aspirin EC 81 MG tablet Take 81 mg by mouth daily.    [provider]  bisoprolol (ZEBETA) 10 MG tablet Take 10 mg by mouth daily.    [provider]  Cholecalciferol (VITAMIN D) 2000 units tablet Take 2,000 Units by mouth daily.    [provider]  furosemide (LASIX) 20 MG tablet Take 1 tablet (20 mg total) by mouth  daily. 12/14/16   Hosie Poisson, MD  Krill Oil 500 MG CAPS Take 500 mg by mouth daily.    [provider]  lidocaine-prilocaine (EMLA) cream Apply to affected area once Patient taking differently: Apply 1 application topically See admin instructions. Apply to affected area once 12/30/16   Heath Lark, MD  Multiple Vitamins-Minerals (MULTI FOR HER 50+) TABS Take 1 tablet by mouth daily.    [provider]  simvastatin (ZOCOR) 20 MG tablet Take 20 mg by mouth daily at 6 PM.     [provider]    Family History Family History  Problem Relation Age of Onset  . Cancer Other   . Hyperlipidemia Other   . Hypertension Other   . Heart failure Mother   . Breast cancer Sister        dx in her 48s  . Lymphoma Father 8  . Prostate cancer Brother 34  . Kidney cancer Brother 19  . Colon cancer Maternal Aunt   . Breast cancer Sister 17  . Breast cancer Sister 19  . Prostate cancer Brother   . Breast cancer Other 58       Niece  . Cancer Other        nephew with a neuroendocrine tumor of the head/neck area  . Prostate cancer Other 31       nephew  . Melanoma Other        nephew  . Cancer Cousin        unknown cancer - pat first cousin  . Esophageal cancer Daughter 59  . Cancer Other 44       unknown cancer    Social History Social History   Tobacco Use  . Smoking status: Never Smoker  . Smokeless tobacco: Never Used  Substance Use Topics  . Alcohol use: No  . Drug use: No     Allergies   Tetanus toxoids and Codeine   Review of Systems Review of Systems  All other systems reviewed and are negative.    Physical Exam Updated Vital Signs BP (!) 159/65 (BP Location: Left Arm)   Pulse 61   Temp 98.8 F (37.1 C) (Oral)   Resp 18   Ht 5\' 2"  (1.575 m)   Wt 71.2 kg   SpO2 100%   BMI 28.71 kg/m   Physical Exam Vitals signs and nursing note reviewed.  Constitutional:      General: She is not in acute distress.    Appearance: Normal  appearance. She is well-developed. She is obese. She is not ill-appearing, toxic-appearing or diaphoretic.  Comments: Elderly, frail  HENT:     Head: Normocephalic and atraumatic.     Nose: Nose normal.     Mouth/Throat:     Mouth: Mucous membranes are moist.     Pharynx: Oropharynx is clear. No oropharyngeal exudate or posterior oropharyngeal erythema.  Eyes:     Conjunctiva/sclera: Conjunctivae normal.     Pupils: Pupils are equal, round, and reactive to light.  Neck:     Musculoskeletal: Normal range of motion and neck supple.     Trachea: Phonation normal.  Cardiovascular:     Rate and Rhythm: Normal rate and regular rhythm.  Pulmonary:     Effort: Pulmonary effort is normal.     Breath sounds: Normal breath sounds.  Chest:     Chest wall: No tenderness.  Abdominal:     General: There is no distension.     Palpations: Abdomen is soft.     Tenderness: There is no abdominal tenderness. There is no guarding.  Musculoskeletal: Normal range of motion.  Skin:    General: Skin is warm and dry.  Neurological:     Mental Status: She is alert. Mental status is at baseline.     Cranial Nerves: No cranial nerve deficit.     Motor: No abnormal muscle tone.     Coordination: Coordination normal.  Psychiatric:        Mood and Affect: Mood normal.        Behavior: Behavior normal.      ED Treatments / Results  Labs (all labs ordered are listed, but only abnormal results are displayed) Labs Reviewed - No data to display  EKG None  Radiology No results found.  Procedures Procedures (including critical care time)  Medications Ordered in ED Medications  ondansetron (ZOFRAN-ODT) disintegrating tablet 8 mg (has no administration in time range)     Initial Impression / Assessment and Plan / ED Course  I have reviewed the triage vital signs and the nursing notes.  Pertinent labs & imaging results that were available during my care of the patient were reviewed by me and  considered in my medical decision making (see chart for details).      Patient Vitals for the past 24 hrs:  BP Temp Temp src Pulse Resp SpO2 Height Weight  06/16/18 1922 (!) 159/65 98.8 F (37.1 C) Oral 61 18 100 % 5\' 2"  (1.575 m) 71.2 kg    8:27 PM Reevaluation with update and discussion. After initial assessment and treatment, an updated evaluation reveals no change in clinical status.  Findings discussed with patient and her daughter, all questions answered. Daleen Bo   Medical Decision Making: Patient being evaluated for short-term illness of nausea and vomiting, which began today.  She is tolerating some liquids.  Her PCP called in a prescription for Zofran today.  It has not yet been started.  Patient tolerated water in the ED, without vomiting.  Not serious bacterial infection, metabolic instability or impending vascular collapse.  CRITICAL CARE-no Performed by: Daleen Bo  Nursing Notes Reviewed/ Care Coordinated Applicable Imaging Reviewed Interpretation of Laboratory Data incorporated into ED treatment  The patient appears reasonably screened and/or stabilized for discharge and I doubt any other medical condition or other [facility] requiring further screening, evaluation, or treatment in the ED at this time prior to discharge.  Plan: Home Medications-usual medications; Home Treatments-advance diet; return here if the recommended treatment, does not improve the symptoms; Recommended follow up-PCP PRN   Final Clinical Impressions(s) / ED  Diagnoses   Final diagnoses:  Non-intractable vomiting with nausea, unspecified vomiting type    ED Discharge Orders    None       Daleen Bo, MD 06/16/18 2027

## 2018-06-16 NOTE — ED Notes (Signed)
Pt is oriented to person and place.  Confused to time and situation.  Pt said she does not recall vomiting any today. Evidence on clothing shows prior emesis.

## 2018-06-19 ENCOUNTER — Telehealth: Payer: Self-pay | Admitting: *Deleted

## 2018-06-19 NOTE — Telephone Encounter (Signed)
Dr Brigitte Pulse, internal medicine called and left a message to speak with Dr. Denman George regarding the patient. Called and spoke with Dr. Brigitte Pulse, explained I have reached to to Dr. Denman George and she will call him.

## 2018-06-22 ENCOUNTER — Telehealth: Payer: Self-pay

## 2018-06-22 NOTE — Telephone Encounter (Signed)
Spoke with daughter Shirlean Mylar. Mother feeling better today. No vomiting. Daughter can pick up a CD disc of CT scan done last week and bring her mother to see Dr. Denman George this Wednesday at 1015 to discuss plan of care. Told daughter to call PCP  Dr. Brigitte Pulse if mother has nausea not controlled with medication as PCP prescribed ATB for UTI. Daughter verbalized understanding.

## 2018-06-24 ENCOUNTER — Inpatient Hospital Stay: Payer: Medicare Other | Attending: Gynecologic Oncology | Admitting: Gynecologic Oncology

## 2018-06-24 ENCOUNTER — Inpatient Hospital Stay: Payer: Medicare Other

## 2018-06-24 ENCOUNTER — Ambulatory Visit: Payer: Self-pay | Admitting: *Deleted

## 2018-06-24 ENCOUNTER — Encounter: Payer: Self-pay | Admitting: Gynecologic Oncology

## 2018-06-24 VITALS — BP 109/57 | HR 65 | Temp 98.1°F | Resp 20 | Ht 62.0 in | Wt 158.0 lb

## 2018-06-24 DIAGNOSIS — C569 Malignant neoplasm of unspecified ovary: Secondary | ICD-10-CM

## 2018-06-24 DIAGNOSIS — C561 Malignant neoplasm of right ovary: Secondary | ICD-10-CM

## 2018-06-24 DIAGNOSIS — Z9221 Personal history of antineoplastic chemotherapy: Secondary | ICD-10-CM | POA: Insufficient documentation

## 2018-06-24 DIAGNOSIS — C786 Secondary malignant neoplasm of retroperitoneum and peritoneum: Secondary | ICD-10-CM

## 2018-06-24 DIAGNOSIS — Z90722 Acquired absence of ovaries, bilateral: Secondary | ICD-10-CM | POA: Diagnosis not present

## 2018-06-24 DIAGNOSIS — F0390 Unspecified dementia without behavioral disturbance: Secondary | ICD-10-CM | POA: Diagnosis not present

## 2018-06-24 DIAGNOSIS — Z90710 Acquired absence of both cervix and uterus: Secondary | ICD-10-CM | POA: Insufficient documentation

## 2018-06-24 NOTE — Progress Notes (Signed)
Follow-up Note: Gyn-Onc  Consult was requested by Dr. Karleen Hampshire for the evaluation of Denise Bonilla 85 y.o. female  CC:  Chief Complaint  Patient presents with  . Ovarian Cancer    recurrent    Assessment/Plan:  [name]  is a 85 y.o.  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 She is recovering from other recent illnesses and is weak at present.  I discussed with her daughters that salvage therapy for ovarian cancer is palliative in intent and not curative, and therefore given that she is asymptomatic from her cancer, it is reasonable to wait before starting therapy.  I also discussed that early initiation of salvage therapy is not associated with improved overall survival.  However if Carlesha were to improve with her performance status over the next month, and potentially become symptomatic from her recurrence, reinitiating chemotherapy with either single agent carboplatin or platinum-containing doublet would be reasonable to consider given that she tolerated therapy fairly well in 2019.  I also discussed with the family that it is reasonable to consider palliative care and hospice as opposed to retreatment.  They will consider these options.  I will schedule him to see Dr. Alvy Bimler in 1 month time for reevaluation of performance status and treatment goals.  If she is doing well at that time and her daughters desire reinitiation of therapy for her recurrent disease Dr. go such will consider for the appropriateness of this at that time.  In the meantime we will check her Ca1 25 and obtain a port flush.  She may benefit from a CT abd/pelvis prior to seeing Dr Alvy Bimler to better evaluate the status of her disease.   HPI: Denise Bonilla is a 85 year old P3 who is seen in consultation at the request of Dr Karleen Hampshire for presumed stage IV right ovarian cancer.  The patient has  been having nausea and anorexia (but without weight loss or GI obstruction symptoms) since June, 2018. Her daughter took her to an ED for this where she was treated for UTI. Chest imaging had revealed a large pleural effusion. She had a small right pleural effusion.   CT chest/abdo/pelvis on 12/12/16 showed a left pleural effusion, tiny right pleural effusion. Nonspecific ill defined low density in the inferior right lobe of the liver. No bowel obstruction. Large right adnexal cystic structure measures 8.5x6.5cm with internal septations. Left ovary not well definied. No gross left adnexal mass. Uterus is abnormal in appearance for age with endometrial thinkening and fluid in the canal. Posterior heterogeneous lesion in the uterus may be fibroid. Small volume of upper abdominal ascites.   Cytology from the thoracentesis on 12/08/16 showed metastatic adenocarcinoma with positive stains for CK7, Pax 8, WT1, negative for CK 20 and ER. Overall favored gyn primary.  CA 125 on 12/13/16 was elevated at 1310 CA 19-9 was elevated at 357.  The patient has had a prior left ovarian cystectomy (possible LSO) remotely in the past for a benign ovarian cyst.  She has a history of dementia but is currently living with her daughter and fairly independent with most activities. She is a fair historian.  She went on 2 receive 4 cycles of carb/tax chemotherapy ( 01/01/17 - 03/06/17).  She required admission after cycle 1 for nausea and emesis. She has improved PS after each cycle and is now doing well with chemotherapy.  CA 125 was 128  on 03/06/17 (day 1 of cycle 4).  CT abdo/pelvison 03/05/17 showed a persistent right ovarian cyst measuring 8.5cm with decrease in ascites, and persistent but improved left omental thickening.    She received cycle 4 of chemotherapy then underwent debulking (interval) surgery on 04/22/17 with robotic assisted total hysterectomy, BSO, and minilaparotomy for total omentectomy. Surgical findings  were significant for a 7cm right ovarian cyst, and a 4cm tumor nodule in the left upper quadrant omentum. At the end of the procedure there was no gross residual disease remaining, an R0 optimal cytoreduction.  Final pathology confirmed a right ovarian high grade serous carcinoma metastatic to the omentum.  She went on to complete  3 additional cycles of adjuvant chemotherapy in February, 2019.  Genetic testing negative.  CA 125 on 12/30/17 normal at 31.5. CA 125 on 03/17/18 normal at 26.2.   Interval Hx: On generally 06/16/2018 Linette was seen at [facility] emergency department for nausea and emesis.  This later spontaneously resolved and was felt to be stomach flu.  Due to her symptoms at the time she underwent a CT scan of the chest which revealed a 6.5 x 5.4 cm mass arising from the right adrenal gland which was not present on the prior examination.  It compressed the adjacent IVC.  The remainder of the upper abdomen that was visualized on the scan was unremarkable.  This lesion was suspicious for recurrent ovarian cancer.  Current Meds:  Outpatient Encounter Medications as of 06/24/2018  Medication Sig  . apixaban (ELIQUIS) 2.5 MG TABS tablet Take 2.5 mg by mouth 2 (two) times daily.  . Ascorbic Acid (VITAMIN C) 1000 MG tablet Take 1,000 mg by mouth daily.  . bisoprolol (ZEBETA) 10 MG tablet Take 10 mg by mouth daily.  . Cholecalciferol (VITAMIN D) 2000 units tablet Take 2,000 Units by mouth daily.  . furosemide (LASIX) 20 MG tablet Take 1 tablet (20 mg total) by mouth daily.  Javier Docker Oil 500 MG CAPS Take 500 mg by mouth daily.  Marland Kitchen lidocaine-prilocaine (EMLA) cream Apply to affected area once (Patient taking differently: Apply 1 application topically See admin instructions. Apply to affected area once)  . Multiple Vitamins-Minerals (MULTI FOR HER 50+) TABS Take 1 tablet by mouth daily.  . ondansetron (ZOFRAN) 4 MG tablet Take 4 mg by mouth every 8 (eight) hours as needed for  nausea or vomiting.  . simvastatin (ZOCOR) 20 MG tablet Take 20 mg by mouth daily at 6 PM.   . [DISCONTINUED] aspirin EC 81 MG tablet Take 81 mg by mouth daily.   Facility-Administered Encounter Medications as of 06/24/2018  Medication  . heparin lock flush 100 unit/mL  . sodium chloride flush (NS) 0.9 % injection 10 mL    Allergy:  Allergies  Allergen Reactions  . Tetanus Toxoids Anaphylaxis  . Codeine Nausea And Vomiting    Social Hx:   Social History   Socioeconomic History  . Marital status: Widowed    Spouse name: Not on file  . Number of children: 3  . Years of education: Not on file  . Highest education level: Not on file  Occupational History  . Occupation: retired  Scientific laboratory technician  . Financial resource strain: Not on file  . Food insecurity:    Worry: Not on file    Inability: Not on file  . Transportation needs:    Medical: Not on file    Non-medical: Not on file  Tobacco Use  . Smoking status: Never Smoker  .  Smokeless tobacco: Never Used  Substance and Sexual Activity  . Alcohol use: No  . Drug use: No  . Sexual activity: Never  Lifestyle  . Physical activity:    Days per week: Not on file    Minutes per session: Not on file  . Stress: Not on file  Relationships  . Social connections:    Talks on phone: Not on file    Gets together: Not on file    Attends religious service: Not on file    Active member of club or organization: Not on file    Attends meetings of clubs or organizations: Not on file    Relationship status: Not on file  . Intimate partner violence:    Fear of current or ex partner: Not on file    Emotionally abused: Not on file    Physically abused: Not on file    Forced sexual activity: Not on file  Other Topics Concern  . Not on file  Social History Narrative  . Not on file    Past Surgical Hx:  Past Surgical History:  Procedure Laterality Date  . ABDOMINAL HYSTERECTOMY     04/22/17 Dr. Denman George  . CYSTECTOMY    . DEBULKING  N/A 04/22/2017   Procedure: TUMOR DEBULKING;  Surgeon: Everitt Amber, MD;  Location: [facility];  Service: Gynecology;  Laterality: N/A;  . INNER EAR SURGERY     cyst removed  . IR FLUORO GUIDE PORT INSERTION RIGHT  03/20/2017  . IR US GUIDE VASC ACCESS RIGHT  03/20/2017  . LAPAROTOMY N/A 04/22/2017   Procedure: EXPLORATORY LAPAROTOMY;  Surgeon: Everitt Amber, MD;  Location: [facility];  Service: Gynecology;  Laterality: N/A;  . OMENTECTOMY N/A 04/22/2017   Procedure: OMENTECTOMY;  Surgeon: Everitt Amber, MD;  Location: [facility];  Service: Gynecology;  Laterality: N/A;  . ROBOTIC ASSISTED TOTAL HYSTERECTOMY WITH BILATERAL SALPINGO OOPHERECTOMY Bilateral 04/22/2017   Procedure: XI ROBOTIC ASSISTED TOTAL HYSTERECTOMY WITH BILATERAL SALPINGO OOPHORECTOMY;  Surgeon: Everitt Amber, MD;  Location: [facility];  Service: Gynecology;  Laterality: Bilateral;    Past Medical Hx:  Past Medical History:  Diagnosis Date  . CHF (congestive heart failure) (HCC)    mild  . Complication of anesthesia   . Dementia ([place])   . Family history of adverse reaction to anesthesia    Daughter [place] also  . Family history of breast cancer   . Family history of kidney cancer   . Family history of prostate cancer   . Hyperlipidemia   . Hypertension   . Malignant pleural effusion    Left  . Ovarian cancer on right ([place]) 12/23/2016  . PONV (postoperative nausea and vomiting)   . UTI (urinary tract infection)     Past Gynecological History:  SVDx 3 No LMP recorded. Patient is postmenopausal.  Family Hx:  Family History  Problem Relation Age of Onset  . Cancer Other   . Hyperlipidemia Other   . Hypertension Other   . Heart failure Mother   . Breast cancer Sister        dx in her 22s  . Lymphoma Father 50  . Prostate cancer Brother 41  . Kidney cancer Brother 16  . Colon cancer Maternal Aunt   . Breast cancer Sister 31  . Breast cancer Sister 32  . Prostate cancer Brother   . Breast cancer Other 6       Niece  . Cancer  Other        nephew with  a neuroendocrine tumor of the head/neck area  . Prostate cancer Other 41       nephew  . Melanoma Other        nephew  . Cancer Cousin        unknown cancer - pat first cousin  . Esophageal cancer Daughter 6  . Cancer Other 21       unknown cancer    Review of Systems:  Constitutional  Feels well,    ENT Normal appearing ears and nares bilaterally Skin/Breast  No rash, sores, jaundice, itching, dryness Cardiovascular  No chest pain, shortness of breath, or edema  Pulmonary  No cough or wheeze.  Gastro Intestinal  No nausea, vomitting, or diarrhoea. No bright red blood per rectum, no abdominal pain, change in bowel movement, or constipation.  Genito Urinary  No frequency, urgency, dysuria, no bleeding Musculo Skeletal  No myalgia, arthralgia, joint swelling or pain  Neurologic  No weakness, numbness, change in gait,  Psychology  No depression, anxiety, insomnia.   Vitals:  Blood pressure (!) 109/57, pulse 65, temperature 98.1 F (36.7 C), temperature source Oral, resp. rate 20, height '5\' 2"'  (1.575 m), weight 158 lb (71.7 kg), SpO2 99 %.  Physical Exam: WD in NAD Neck  Supple NROM, without any enlargements.  Lymph Node Survey No cervical supraclavicular or inguinal adenopathy Cardiovascular  Pulse normal rate, regularity and rhythm. S1 and S2 normal.  Lungs  Clear to auscultation bilateraly, without wheezes/crackles/rhonchi. Good air movement.  Skin  No rash/lesions/breakdown  Psychiatry  Alert and oriented to person, place, and time  Abdomen  Normoactive bowel sounds, abdomen soft, non-tender and nonobese without evidence of hernia. No masses. Back No CVA tenderness Genito Urinary  Vulva/vagina: Normal external female genitalia.  No lesions. No discharge or bleeding.  Bladder/urethra:  No lesions or masses, well supported bladder  Vagina: cuff in tact, no bleeding, no masses.  Adnexa: no palpable masses. Rectal   deferred Extremities  No bilateral cyanosis, clubbing or edema.   Thereasa Solo, MD  06/24/2018, 6:04 PM

## 2018-06-24 NOTE — Patient Instructions (Signed)
Follow up with Dr Alvy Bimler in 4 weeks.  Scheduling will contact you with an appointment for Dr Alvy Bimler.  You will have your flush and lab appointment today.  If you have any concerns or questions, our number is [phone].

## 2018-06-25 ENCOUNTER — Telehealth: Payer: Self-pay | Admitting: Oncology

## 2018-06-25 ENCOUNTER — Telehealth: Payer: Self-pay | Admitting: Hematology and Oncology

## 2018-06-25 LAB — CA 125: Cancer Antigen (CA) 125: 281 U/mL — ABNORMAL HIGH (ref 0.0–38.1)

## 2018-06-25 NOTE — Telephone Encounter (Signed)
Called [facility] and spoke to her daughter Shirlean Mylar about the CT scan scheduled for 07/09/18 at 10:30 and labs at 9:30.  Discussed that she will need to be NPO 4 hours before that that she will need to drink the first bottle of contrast at 8:30 and the second at 9:30.  Shirlean Mylar asked is they could pick up the contrast at [facility].  Advised her that I will check and call her back.

## 2018-06-25 NOTE — Telephone Encounter (Signed)
Scheduled appt per 1/15 sch message for f/u - pt daughter is aware of appt date and time

## 2018-06-26 ENCOUNTER — Other Ambulatory Visit: Payer: Self-pay | Admitting: Oncology

## 2018-06-26 DIAGNOSIS — C561 Malignant neoplasm of right ovary: Secondary | ICD-10-CM

## 2018-06-29 ENCOUNTER — Other Ambulatory Visit (HOSPITAL_COMMUNITY): Payer: Medicare Other

## 2018-06-29 ENCOUNTER — Encounter (HOSPITAL_COMMUNITY): Payer: Medicare Other

## 2018-06-29 NOTE — Telephone Encounter (Signed)
Entered in error

## 2018-07-01 ENCOUNTER — Telehealth: Payer: Self-pay | Admitting: Oncology

## 2018-07-01 NOTE — Telephone Encounter (Signed)
Called Shirlean Mylar, patient's daughter, and scheduled appointment to see Dr. Alvy Bimler on 07/10/18 at 1:30 pm to review CT Scan.

## 2018-07-03 ENCOUNTER — Ambulatory Visit: Payer: Medicare Other | Admitting: Gynecologic Oncology

## 2018-07-09 ENCOUNTER — Other Ambulatory Visit: Payer: Medicare Other

## 2018-07-09 ENCOUNTER — Ambulatory Visit (HOSPITAL_COMMUNITY)
Admission: RE | Admit: 2018-07-09 | Discharge: 2018-07-09 | Disposition: A | Discharge status: Home / Self Care | Payer: Medicare Other | Source: Ambulatory Visit | Attending: Gynecologic Oncology | Admitting: Gynecologic Oncology

## 2018-07-09 ENCOUNTER — Inpatient Hospital Stay: Payer: Medicare Other

## 2018-07-09 DIAGNOSIS — C561 Malignant neoplasm of right ovary: Secondary | ICD-10-CM

## 2018-07-09 DIAGNOSIS — C569 Malignant neoplasm of unspecified ovary: Secondary | ICD-10-CM | POA: Diagnosis not present

## 2018-07-09 DIAGNOSIS — Z95828 Presence of other vascular implants and grafts: Secondary | ICD-10-CM

## 2018-07-09 LAB — BASIC METABOLIC PANEL - CANCER CENTER ONLY
Anion gap: 10 (ref 5–15)
BUN: 13 mg/dL (ref 8–23)
CALCIUM: 10 mg/dL (ref 8.9–10.3)
CO2: 26 mmol/L (ref 22–32)
CREATININE: 0.95 mg/dL (ref 0.44–1.00)
Chloride: 99 mmol/L (ref 98–111)
GFR, Est AFR Am: >60 mL/min (ref >60)
GFR, Estimated: 54 mL/min — ABNORMAL LOW (ref >60)
GLUCOSE: 100 mg/dL — AB (ref 70–99)
Potassium: 4.5 mmol/L (ref 3.5–5.1)
Sodium: 135 mmol/L (ref 135–145)

## 2018-07-09 MED ORDER — HEPARIN SOD (PORK) LOCK FLUSH 100 UNIT/ML IV SOLN
INTRAVENOUS | Status: AC
  Filled 2018-07-09: qty 5

## 2018-07-09 MED ORDER — IOHEXOL 300 MG/ML  SOLN
100.0000 mL | Freq: Once | INTRAMUSCULAR | Status: AC | PRN
  Administered 2018-07-09: 100 mL via INTRAVENOUS

## 2018-07-09 MED ORDER — HEPARIN SOD (PORK) LOCK FLUSH 100 UNIT/ML IV SOLN
500.0000 [IU] | Freq: Once | INTRAVENOUS | Status: AC
  Administered 2018-07-09: 500 [IU] via INTRAVENOUS

## 2018-07-09 MED ORDER — SODIUM CHLORIDE (PF) 0.9 % IJ SOLN
INTRAMUSCULAR | Status: AC
  Filled 2018-07-09: qty 50

## 2018-07-09 MED ORDER — SODIUM CHLORIDE 0.9% FLUSH
10.0000 mL | INTRAVENOUS | Status: DC | PRN
Start: 2018-07-09 — End: 2018-07-09
  Administered 2018-07-09: 10 mL
  Filled 2018-07-09: qty 10

## 2018-07-10 ENCOUNTER — Telehealth: Payer: Self-pay | Admitting: Hematology and Oncology

## 2018-07-10 ENCOUNTER — Encounter: Payer: Self-pay | Admitting: Hematology and Oncology

## 2018-07-10 ENCOUNTER — Inpatient Hospital Stay (HOSPITAL_BASED_OUTPATIENT_CLINIC_OR_DEPARTMENT_OTHER): Payer: Medicare Other | Admitting: Hematology and Oncology

## 2018-07-10 VITALS — BP 116/55 | HR 68 | Temp 98.0°F | Resp 17 | Ht 62.0 in | Wt 154.4 lb

## 2018-07-10 DIAGNOSIS — C561 Malignant neoplasm of right ovary: Secondary | ICD-10-CM | POA: Diagnosis not present

## 2018-07-10 DIAGNOSIS — F0390 Unspecified dementia without behavioral disturbance: Secondary | ICD-10-CM

## 2018-07-10 DIAGNOSIS — K5909 Other constipation: Secondary | ICD-10-CM

## 2018-07-10 DIAGNOSIS — Z7189 Other specified counseling: Secondary | ICD-10-CM

## 2018-07-10 DIAGNOSIS — C569 Malignant neoplasm of unspecified ovary: Secondary | ICD-10-CM | POA: Diagnosis not present

## 2018-07-10 LAB — CA 125: Cancer Antigen (CA) 125: 532 U/mL — ABNORMAL HIGH (ref 0.0–38.1)

## 2018-07-10 MED ORDER — PROCHLORPERAZINE MALEATE 10 MG PO TABS: 10.0000 mg | ORAL_TABLET | Freq: Four times a day (QID) | ORAL | 1 refills | Status: AC | PRN

## 2018-07-10 NOTE — Assessment & Plan Note (Signed)
She has history of chronic constipation.  We discussed MiraLAX

## 2018-07-10 NOTE — Progress Notes (Signed)
[facility] OFFICE PROGRESS NOTE  Patient Care Team: Monico Blitz, MD as PCP - General (Internal Medicine)  ASSESSMENT & PLAN:  Ovarian cancer on right [facility]) I have reviewed multiple imaging studies with the patient and family The elevated tumor marker and pelvic mass are consistent with cancer recurrence The peculiar mass near the right kidney is indeterminate We discussed the rationale of not pursuing biopsies  We discussed the role of palliative chemotherapy We discussed combination chemotherapy with carboplatin and Taxol versus single agent carboplatin  We discussed the role of chemotherapy. The intent is of palliative intent.  We discussed some of the risks, benefits, side-effects of carboplatin  Some of the short term side-effects included, though not limited to, including weight loss, life threatening infections, risk of allergic reactions, need for transfusions of blood products, nausea, vomiting, change in bowel habits, loss of hair, admission to hospital for various reasons, and risks of death.   Long term side-effects are also discussed including risks of infertility, permanent damage to nerve function, hearing loss, chronic fatigue, kidney damage with possibility needing hemodialysis, and rare secondary malignancy including bone marrow disorders.  The patient is aware that the response rates discussed earlier is not guaranteed.  After a long discussion, patient made an informed decision to proceed with the prescribed plan of care.   Patient education material was dispensed. We will follow tumor marker monthly I recommend minimum 3 months of treatment before repeat CT imaging If she has excellent response to therapy, I will put her on maintenance  PARP inhibitor   Dementia Her dementia is not severe and is without behavioral disturbances Her daughters are her healthcare power of attorney  Goals of care, counseling/discussion I have some time to discuss  goals of care with the patient and her daughter The patient has background history of dementia but appears to be coping well We discussed the role of treatment is strictly palliative  Chronic constipation She has history of chronic constipation.  We discussed MiraLAX   Orders Placed This Encounter  Procedures  . CBC with Differential/Platelet    Standing Status:   Standing    Number of Occurrences:   11    Standing Expiration Date:   07/11/2019  . Comprehensive metabolic panel    Standing Status:   Standing    Number of Occurrences:   11    Standing Expiration Date:   07/11/2019  . CA 125    Standing Status:   Standing    Number of Occurrences:   11    Standing Expiration Date:   07/11/2019    INTERVAL HISTORY: Please see below for problem oriented charting. The patient and family members return to discuss recent test results She has chronic constipation but denies nausea Her performance status is improved since she has completed treatment for urinary tract infection She denies pain  SUMMARY OF ONCOLOGIC HISTORY: Oncology History   Negative BRCA in tumor analysis and blood test     Ovarian cancer on right ([place])   12/07/2016 Imaging    Ct angiogram:  No evidence of pulmonary embolus.  Large left and small right pleural effusions. Complete collapse of the left lower lobe, partial collapse of the lingula and milder atelectasis of the left upper lobe, likely due to compressive atelectasis. The collapsed lung parenchyma is poorly evaluated, but no obvious pulmonary masses are seen.  Interstitial pulmonary edema.  Small volume abdominal ascites.  Heterogeneous appearance of the thyroid gland, with right sided nodules.  Aortic Atherosclerosis (ICD10-I70.0).    12/07/2016 - 12/13/2016 Hospital Admission    She was admitted for generalized malaise, abnormal blood work, large bilateral pleural effusion and possible right ovarian cancer    12/08/2016 Imaging    LV EF: 65% -    70%    12/08/2016 Pathology Results    PLEURAL FLUID, LEFT (SPECIMEN 1 OF 1, COLLECTED ON 12/08/16): - MALIGNANT CELLS CONSISTENT WITH METASTATIC ADENOCARCINOMA, SEE COMMENT    12/08/2016 Procedure    Successful ultrasound guided left thoracentesis yielding 1.6 L of pleural fluid. The procedure was terminated early secondary to significant coughing.    12/12/2016 Imaging    CT scan of abdomen and pelvis 1. Septated right adnexal cyst measuring 8.5 x 6.5 cm, concerning for ovarian malignancy in this setting. Small volume complex intra-abdominal ascites which tracks in the pericolic gutters and anterior omentum. No definite soft tissue omental caking. 2. Endometrial thickening versus fluid in the endometrial canal, nonspecific. Rounded 3.9 cm lesion in the uterine fundus may be a fibroid, however would be unusual for patient's age. Endometrial/ uterine neoplasm is considered. 3. Nonspecific ill-defined low-density lesion in the right lobe of the liver. Probable hepatic hemangioma at the dome. 4. Further evaluation of the GYN structures could be performed with ultrasound. MRI may provide further detail if needed for potential surgical planning. 5. Aortic atherosclerosis    12/12/2016 Imaging    CXR 1. Progressive left base atelectasis and infiltrate. Small left pleural effusion again noted. 2.  Cardiomegaly, no evidence of pulmonary venous congestion.    12/13/2016 Tumor Marker    Patient's tumor was tested for the following markers: CA-125. Results of the tumor marker test revealed 1310.    11/04/202018 Procedure    Ultrasound and fluoroscopically guided right internal jugular single lumen power port catheter insertion. Tip in the SVC/RA junction. Catheter ready for use.    01/01/2017 - 07/03/2017 Chemotherapy    She received carboplatin and Taxol x 5 cycles, interrupted in November for interval debulking surgery, and resumed to receive 3 more cycles from December to January 2019    01/07/2017 - 01/11/2017  Hospital Admission    She was admitted for severe nausea/vomiting and pleural effusion.    01/08/2017 Procedure    Successful ultrasound guided left thoracentesis yielding 1750 mL of pleural fluid    01/08/2017 Imaging    Marked improvement in left pleural effusion following thoracentesis. Negative for pneumothorax    01/22/2017 Adverse Reaction    Second dose of chemo requires significant dose adjustment and GCSF support    02/13/2017 Tumor Marker    Patient's tumor was tested for the following markers: CA-125. Results of the tumor marker test revealed 310.6    03/05/2017 Imaging    1. Persistent mild ventral peritoneal / omental nodularity in the LEFT abdomen concerning for peritoneal carcinomatosis. 2. Interval reduction in free fluid the abdomen pelvis. 3. Slight decrease in volume of large RIGHT adnexal cystic mass. 4. Stable LEFT pleural effusion.    03/06/2017 Tumor Marker    Patient's tumor was tested for the following markers: CA-125. Results of the tumor marker test revealed 128.1    03/27/2017 Tumor Marker    Patient's tumor was tested for the following markers: CA-125. Results of the tumor marker test revealed 96.5    04/22/2017 Pathology Results    1. Uterus +/- tubes/ovaries, neoplastic - RIGHT OVARY: HIGH GRADE CARCINOMA, SPANNING 7 CM, SEE COMMENT. PSAMMOMATOUS DEPOSITS ON SURFACE. SEE ONCOLOGY TABLE. - LEFT MESO-OVARIUM: METASTATIC HIGH  GRADE CARCINOMA. DEFINITIVE OVARIAN TISSUE NOT IDENTIFIED. - UTERUS: -ENDOMETRIUM: BENIGN ENDOMETRIAL TYPE POLYP. NO MALIGNANCY IDENTIFIED. -MYOMETRIUM: -SEROSA: PSAMMOMATOUS DEPOSITS. NO VIABLE TUMOR IDENTIFIED. - CERVIX: NO DYSPLASIA OR MALIGNANCY. - BILATERAL FALLOPIAN TUBES: RIGHT: METASTATIC HIGH GRADE CARCINOMA. PARATUBAL CYSTS. LEFT: PSAMMOMATOUS DEPOSITS. 2. Omentum, biopsy - METASTATIC HIGH GRADE CARCINOMA. Microscopic Comment 1. OVARY Specimen(s): Uterus, cervix, bilateral fallopian tubes, right ovary,  omentum. Procedure: (including lymph node sampling): Total hysterectomy with bilateral salpingo-oophorectomy and omental biopsy. Primary tumor site (including laterality): Right ovary. Ovarian surface involvement: Present. Ovarian capsule intact without fragmentation: Yes. Maximum tumor size (cm): 7 cm. Histologic type: High grade carcinoma, see comment. Grade: High grade. Peritoneal implants: (specify invasive or non-invasive): Present, invasive. Pelvic extension (list additional structures on separate lines and if involved): Right fallopian tube, left meso-ovarium, omentum (>2 cm deposit). Lymph nodes: number examined 0 ; number positive 0  TNM code: pT3c, pN0, pMX FIGO Stage (based on pathologic findings, needs clinical correlation): IIIC Comments: The tumor consists of large papillary type excrescences with high grade tumor. There are clear cell features and the tumor is negative for WT-1 and p53, thus the tumor is favored to be a clear cell carcinoma or at least have had a component of clear cell carcinoma (residual after treatment). Dr. Lyndon Code has reviewed select slides    04/22/2017 Surgery    Pre-operative Diagnosis: stage IV ovarian cancer, s/p neoadjuvant chemotherapy  Post-operative Diagnosis: same  Operation: Robotic-assisted laparoscopic total hysterectomy with bilateral salpingoophorectomy, ex lap, omentectomy, mobilzation of splenic flexure, radical tumor debulking.  Surgeon: Donaciano Eva  Assistant Surgeon: Lahoma Crocker MD  Operative Findings:  : 7cm right ovarian cyst,  4cm tumor knot in left upper quadrant adherent to the splenic flexure of the colon and stomach.  No gross residual disease at the end of the procedure representing an optimal cytoreduction.      05/23/2017 Tumor Marker    Patient's tumor was tested for the following markers: CA-125. Results of the tumor marker test revealed 69.4    06/13/2017 Tumor Marker    Patient's tumor was  tested for the following markers: CA-125. Results of the tumor marker test revealed 56    06/13/2017 Genetic Testing    Patient has genetic testing done for genetics with [facility] Results revealed patient has no mutations     07/03/2017 Tumor Marker    Patient's tumor was tested for the following markers: CA-125. Results of the tumor marker test revealed 51.1    07/17/2017 Tumor Marker    Patient's tumor was tested for the following markers: CA-125. Results of the tumor marker test revealed 43    09/27/2017 Tumor Marker    Patient's tumor was tested for the following markers: CA-125. Results of the tumor marker test revealed 30.1    12/30/2017 Tumor Marker    Patient's tumor was tested for the following markers: CA-125. Results of the tumor marker test revealed 31.5    03/15/2018 Tumor Marker    Patient's tumor was tested for the following markers: CA-125. Results of the tumor marker test revealed 36.2    07/09/2018 Tumor Marker    Patient's tumor was tested for the following markers: CA-125. Results of the tumor marker test revealed 532    07/09/2018 Imaging    1. Again noted is a large mass within the right adrenal bed which appears mixed in attenuation and has mass effect upon the upper pole of the right kidney and the IVC. The primary differential considerations are for  either adrenal hemorrhage versus metastatic disease from patient's known ovarian carcinoma. A MRI of the adrenal glands may be helpful to assess the underlying enhancement characteristics of this lesion as well as assess for presence of blood products. 2. New left retroperitoneal lymph node is borderline enlarged measuring 1 cm. Additionally, in the left adnexal region there is a 3.1 x 2.0 cm soft tissue mass. In a patient who is status post bilateral oophorectomy for ovarian neoplasm this is suspicious for recurrence of disease. 3.  Aortic Atherosclerosis (ICD10-I70.0).      REVIEW OF SYSTEMS:   Constitutional:  Denies fevers, chills or abnormal weight loss Eyes: Denies blurriness of vision Ears, nose, mouth, throat, and face: Denies mucositis or sore throat Respiratory: Denies cough, dyspnea or wheezes Cardiovascular: Denies palpitation, chest discomfort or lower extremity swelling Skin: Denies abnormal skin rashes Lymphatics: Denies new lymphadenopathy or easy bruising Neurological:Denies numbness, tingling or new weaknesses Behavioral/Psych: Mood is stable, no new changes  All other systems were reviewed with the patient and are negative.  I have reviewed the past medical history, past surgical history, social history and family history with the patient and they are unchanged from previous note.  ALLERGIES:  is allergic to tetanus toxoids and codeine.  MEDICATIONS:  Current Outpatient Medications  Medication Sig Dispense Refill  . apixaban (ELIQUIS) 2.5 MG TABS tablet Take 2.5 mg by mouth 2 (two) times daily.    . Ascorbic Acid (VITAMIN C) 1000 MG tablet Take 1,000 mg by mouth daily.    . bisoprolol (ZEBETA) 10 MG tablet Take 10 mg by mouth daily.    . Cholecalciferol (VITAMIN D) 2000 units tablet Take 2,000 Units by mouth daily.    . furosemide (LASIX) 20 MG tablet Take 1 tablet (20 mg total) by mouth daily. 30 tablet 0  . Krill Oil 500 MG CAPS Take 500 mg by mouth daily.    Marland Kitchen lidocaine-prilocaine (EMLA) cream Apply to affected area once (Patient taking differently: Apply 1 application topically See admin instructions. Apply to affected area once) 30 g 3  . Multiple Vitamins-Minerals (MULTI FOR HER 50+) TABS Take 1 tablet by mouth daily.    . ondansetron (ZOFRAN) 4 MG tablet Take 4 mg by mouth every 8 (eight) hours as needed for nausea or vomiting.    . prochlorperazine (COMPAZINE) 10 MG tablet Take 1 tablet (10 mg total) by mouth every 6 (six) hours as needed (Nausea or vomiting). 30 tablet 1  . simvastatin (ZOCOR) 20 MG tablet Take 20 mg by mouth daily at 6 PM.      No current  facility-administered medications for this visit.    Facility-Administered Medications Ordered in Other Visits  Medication Dose Route Frequency Provider Last Rate Last Dose  . heparin lock flush 100 unit/mL  500 Units Intravenous Once Cross, Melissa D, NP      . sodium chloride flush (NS) 0.9 % injection 10 mL  10 mL Intravenous PRN Cross, Melissa D, NP        PHYSICAL EXAMINATION: ECOG PERFORMANCE STATUS: 1 - Symptomatic but completely ambulatory  Vitals:   07/10/18 1332  BP: (!) 116/55  Pulse: 68  Resp: 17  Temp: 98 F (36.7 C)  SpO2: 98%   Filed Weights   07/10/18 1332  Weight: 154 lb 6.4 oz (70 kg)    GENERAL:alert, no distress and comfortable SKIN: skin color, texture, turgor are normal, no rashes or significant lesions EYES: normal, Conjunctiva are pink and non-injected, sclera clear OROPHARYNX:no exudate, no  erythema and lips, buccal mucosa, and tongue normal  NECK: supple, thyroid normal size, non-tender, without nodularity LYMPH:  no palpable lymphadenopathy in the cervical, axillary or inguinal LUNGS: clear to auscultation and percussion with normal breathing effort HEART: regular rate & rhythm and no murmurs and no lower extremity edema ABDOMEN:abdomen soft, non-tender and normal bowel sounds Musculoskeletal:no cyanosis of digits and no clubbing  NEURO: alert & oriented x 3 with fluent speech, no focal motor/sensory deficits  LABORATORY DATA:  I have reviewed the data as listed    Component Value Date/Time   NA 135 07/09/2018 0947   NA 137 06/13/2017 0925   K 4.5 07/09/2018 0947   K 3.9 06/13/2017 0925   CL 99 07/09/2018 0947   CO2 26 07/09/2018 0947   CO2 24 06/13/2017 0925   GLUCOSE 100 (H) 07/09/2018 0947   GLUCOSE 163 (H) 06/13/2017 0925   BUN 13 07/09/2018 0947   BUN 21.0 06/13/2017 0925   CREATININE 0.95 07/09/2018 0947   CREATININE 0.8 06/13/2017 0925   CALCIUM 10.0 07/09/2018 0947   CALCIUM 9.7 06/13/2017 0925   PROT 6.9 12/30/2017 1206    PROT 6.7 06/13/2017 0925   ALBUMIN 4.1 12/30/2017 1206   ALBUMIN 3.6 06/13/2017 0925   AST 20 12/30/2017 1206   AST 25 06/13/2017 0925   ALT 20 12/30/2017 1206   ALT 33 06/13/2017 0925   ALKPHOS 82 12/30/2017 1206   ALKPHOS 125 06/13/2017 0925   BILITOT 0.8 12/30/2017 1206   BILITOT 0.32 06/13/2017 0925   GFRNONAA 54 (L) 07/09/2018 0947   GFRAA >60 07/09/2018 0947    No results found for: SPEP, UPEP  Lab Results  Component Value Date   WBC 4.5 12/30/2017   NEUTROABS 2.0 12/30/2017   HGB 12.6 12/30/2017   HCT 36.0 12/30/2017   MCV 94.9 12/30/2017   PLT 163 12/30/2017      Chemistry      Component Value Date/Time   NA 135 07/09/2018 0947   NA 137 06/13/2017 0925   K 4.5 07/09/2018 0947   K 3.9 06/13/2017 0925   CL 99 07/09/2018 0947   CO2 26 07/09/2018 0947   CO2 24 06/13/2017 0925   BUN 13 07/09/2018 0947   BUN 21.0 06/13/2017 0925   CREATININE 0.95 07/09/2018 0947   CREATININE 0.8 06/13/2017 0925      Component Value Date/Time   CALCIUM 10.0 07/09/2018 0947   CALCIUM 9.7 06/13/2017 0925   ALKPHOS 82 12/30/2017 1206   ALKPHOS 125 06/13/2017 0925   AST 20 12/30/2017 1206   AST 25 06/13/2017 0925   ALT 20 12/30/2017 1206   ALT 33 06/13/2017 0925   BILITOT 0.8 12/30/2017 1206   BILITOT 0.32 06/13/2017 0925       RADIOGRAPHIC STUDIES: I have reviewed multiple imaging studies with the patient and family I have personally reviewed the radiological images as listed and agreed with the findings in the report. Ct Abdomen Pelvis W Contrast  Result Date: 07/09/2018 CLINICAL DATA:  History of ovarian cancer.  Restaging. EXAM: CT ABDOMEN AND PELVIS WITH CONTRAST TECHNIQUE: Multidetector CT imaging of the abdomen and pelvis was performed using the standard protocol following bolus administration of intravenous contrast. CONTRAST:  153m OMNIPAQUE IOHEXOL 300 MG/ML  SOLN COMPARISON:  06/18/2018 FINDINGS: Lower chest: There are small bilateral pleural effusions.  Subsegmental atelectasis noted in the left lung base. Hepatobiliary: Stable lesion along the posterior dome of liver measuring 1 cm, previously characterized as a benign hemangioma, image 10/2.  The previously noted small enhancing lesion within right lobe of liver, also small hemangioma is unchanged measuring less than 1 cm, image 19/2. No suspicious liver lesion identified at this time. Gallstones are again noted. No biliary dilatation. Pancreas: Unremarkable. No pancreatic ductal dilatation or surrounding inflammatory changes. Spleen: Normal in size without focal abnormality. Adrenals/Urinary Tract: Normal left adrenal gland. A normal right adrenal gland is not visualized. Large right suprarenal mass is identified measuring 7.8 by 7.5 by 7.8 cm. On CT from 06/18/2018 this measured 6.4 x 5.7 by 6.9 cm. This contains amorphous areas of increased attenuation measuring up to 134 this has mass effect upon the upper pole of right kidney and the IVC. Small bilateral low-density kidney lesions are too small to reliably characterize. No hydronephrosis identified bilaterally. The urinary bladder appears normal. Stomach/Bowel: Small hiatal hernia. No abnormal dilatation of the small or large bowel loops. No small bowel wall thickening or inflammation. Vascular/Lymphatic: Aortic atherosclerosis. Left retroperitoneal lymph node measures 1 cm, image 26/2. Previously 0.4 cm. No pelvic or inguinal adenopathy. Reproductive: The patient is status post hysterectomy. By report the patient is also status post bilateral salpingo oophorectomy. In the left adnexal region there is a soft tissue mass which measures 3.1 by 2.0 by 2.6 cm. No right adnexal mass identified. Other: No ascites. Musculoskeletal: Marked scoliosis deformity with lumbar degenerative disc disease. IMPRESSION: 1. Again noted is a large mass within the right adrenal bed which appears mixed in attenuation and has mass effect upon the upper pole of the right kidney and  the IVC. The primary differential considerations are for either adrenal hemorrhage versus metastatic disease from patient's known ovarian carcinoma. A MRI of the adrenal glands may be helpful to assess the underlying enhancement characteristics of this lesion as well as assess for presence of blood products. 2. New left retroperitoneal lymph node is borderline enlarged measuring 1 cm. Additionally, in the left adnexal region there is a 3.1 x 2.0 cm soft tissue mass. In a patient who is status post bilateral oophorectomy for ovarian neoplasm this is suspicious for recurrence of disease. 3.  Aortic Atherosclerosis (ICD10-I70.0). Electronically Signed   By: Kerby Moors M.D.   On: 07/09/2018 15:26    All questions were answered. The patient knows to call the clinic with any problems, questions or concerns. No barriers to learning was detected.  I spent 40 minutes counseling the patient face to face. The total time spent in the appointment was 55 minutes and more than 50% was on counseling and review of test results  Heath Lark, MD 07/10/2018 2:45 PM

## 2018-07-10 NOTE — Assessment & Plan Note (Signed)
I have reviewed multiple imaging studies with the patient and family The elevated tumor marker and pelvic mass are consistent with cancer recurrence The peculiar mass near the right kidney is indeterminate We discussed the rationale of not pursuing biopsies  We discussed the role of palliative chemotherapy We discussed combination chemotherapy with carboplatin and Taxol versus single agent carboplatin  We discussed the role of chemotherapy. The intent is of palliative intent.  We discussed some of the risks, benefits, side-effects of carboplatin  Some of the short term side-effects included, though not limited to, including weight loss, life threatening infections, risk of allergic reactions, need for transfusions of blood products, nausea, vomiting, change in bowel habits, loss of hair, admission to hospital for various reasons, and risks of death.   Long term side-effects are also discussed including risks of infertility, permanent damage to nerve function, hearing loss, chronic fatigue, kidney damage with possibility needing hemodialysis, and rare secondary malignancy including bone marrow disorders.  The patient is aware that the response rates discussed earlier is not guaranteed.  After a long discussion, patient made an informed decision to proceed with the prescribed plan of care.   Patient education material was dispensed. We will follow tumor marker monthly I recommend minimum 3 months of treatment before repeat CT imaging If she has excellent response to therapy, I will put her on maintenance  PARP inhibitor

## 2018-07-10 NOTE — Assessment & Plan Note (Signed)
Her dementia is not severe and is without behavioral disturbances Her daughters are her healthcare power of attorney

## 2018-07-10 NOTE — Progress Notes (Signed)
DISCONTINUE ON PATHWAY REGIMEN - Ovarian     A cycle is every 21 days:     Paclitaxel      Carboplatin   **Always confirm dose/schedule in your pharmacy ordering system**  REASON: Other Reason PRIOR TREATMENT: [id number]: Carboplatin AUC=6 + Paclitaxel 175 mg/m2 q21 Days x 2-4 Cycles TREATMENT RESPONSE: Complete Response (CR)  START ON PATHWAY REGIMEN - Ovarian     A cycle is every 21 days:     Carboplatin   **Always confirm dose/schedule in your pharmacy ordering system**  Patient Characteristics: Recurrent or Progressive Disease, Second Line Therapy, Platinum Sensitive and ? 6 Months Since Last Therapy Therapeutic Status: Recurrent or Progressive Disease BRCA Mutation Status: Absent Line of Therapy: Second Line  Intent of Therapy: Non-Curative / Palliative Intent, Discussed with Patient

## 2018-07-10 NOTE — Assessment & Plan Note (Signed)
I have some time to discuss goals of care with the patient and her daughter The patient has background history of dementia but appears to be coping well We discussed the role of treatment is strictly palliative

## 2018-07-10 NOTE — Telephone Encounter (Signed)
Gave avs and calendar waiting on approval for 2/5 chemo

## 2018-07-13 ENCOUNTER — Telehealth: Payer: Self-pay | Admitting: Hematology and Oncology

## 2018-07-13 NOTE — Telephone Encounter (Signed)
Called regarding 2/5

## 2018-07-15 ENCOUNTER — Inpatient Hospital Stay: Payer: Medicare Other

## 2018-07-15 ENCOUNTER — Inpatient Hospital Stay: Payer: Medicare Other | Attending: Gynecologic Oncology

## 2018-07-15 VITALS — BP 133/63 | HR 63 | Temp 98.2°F | Resp 20 | Wt 153.5 lb

## 2018-07-15 DIAGNOSIS — C561 Malignant neoplasm of right ovary: Secondary | ICD-10-CM

## 2018-07-15 DIAGNOSIS — Z5111 Encounter for antineoplastic chemotherapy: Secondary | ICD-10-CM | POA: Diagnosis not present

## 2018-07-15 DIAGNOSIS — Z95828 Presence of other vascular implants and grafts: Secondary | ICD-10-CM

## 2018-07-15 LAB — CBC WITH DIFFERENTIAL/PLATELET
Abs Immature Granulocytes: 0.12 10*3/uL — ABNORMAL HIGH (ref 0.00–0.07)
Basophils Absolute: 0 10*3/uL (ref 0.0–0.1)
Basophils Relative: 0 %
EOS ABS: 0.1 10*3/uL (ref 0.0–0.5)
Eosinophils Relative: 1 %
HCT: 33.6 % — ABNORMAL LOW (ref 36.0–46.0)
Hemoglobin: 10.9 g/dL — ABNORMAL LOW (ref 12.0–15.0)
Immature Granulocytes: 2 %
LYMPHS ABS: 2.4 10*3/uL (ref 0.7–4.0)
Lymphocytes Relative: 35 %
MCH: 31.3 pg (ref 26.0–34.0)
MCHC: 32.4 g/dL (ref 30.0–36.0)
MCV: 96.6 fL (ref 80.0–100.0)
Monocytes Absolute: 0.7 10*3/uL (ref 0.1–1.0)
Monocytes Relative: 10 %
Neutro Abs: 3.6 10*3/uL (ref 1.7–7.7)
Neutrophils Relative %: 52 %
Platelets: 262 10*3/uL (ref 150–400)
RBC: 3.48 MIL/uL — ABNORMAL LOW (ref 3.87–5.11)
RDW: 13.6 % (ref 11.5–15.5)
WBC: 7 10*3/uL (ref 4.0–10.5)
nRBC: 0 % (ref 0.0–0.2)

## 2018-07-15 LAB — COMPREHENSIVE METABOLIC PANEL
ALK PHOS: 119 U/L (ref 38–126)
ALT: 16 U/L (ref 0–44)
AST: 16 U/L (ref 15–41)
Albumin: 3.4 g/dL — ABNORMAL LOW (ref 3.5–5.0)
Anion gap: 9 (ref 5–15)
BUN: 14 mg/dL (ref 8–23)
CALCIUM: 9.7 mg/dL (ref 8.9–10.3)
CO2: 25 mmol/L (ref 22–32)
Chloride: 102 mmol/L (ref 98–111)
Creatinine, Ser: 0.89 mg/dL (ref 0.44–1.00)
GFR calc Af Amer: >60 mL/min (ref >60)
GFR calc non Af Amer: 59 mL/min — ABNORMAL LOW (ref >60)
Glucose, Bld: 89 mg/dL (ref 70–99)
Potassium: 4.2 mmol/L (ref 3.5–5.1)
Sodium: 136 mmol/L (ref 135–145)
Total Bilirubin: 0.7 mg/dL (ref 0.3–1.2)
Total Protein: 6.6 g/dL (ref 6.5–8.1)

## 2018-07-15 MED ORDER — DIPHENHYDRAMINE HCL 50 MG/ML IJ SOLN
25.0000 mg | Freq: Once | INTRAMUSCULAR | Status: AC
  Administered 2018-07-15: 25 mg via INTRAVENOUS

## 2018-07-15 MED ORDER — PALONOSETRON HCL INJECTION 0.25 MG/5ML
INTRAVENOUS | Status: AC
  Filled 2018-07-15: qty 5

## 2018-07-15 MED ORDER — HEPARIN SOD (PORK) LOCK FLUSH 100 UNIT/ML IV SOLN
500.0000 [IU] | Freq: Once | INTRAVENOUS | Status: DC | PRN
  Filled 2018-07-15: qty 5

## 2018-07-15 MED ORDER — FAMOTIDINE IN NACL 20-0.9 MG/50ML-% IV SOLN
INTRAVENOUS | Status: AC
  Filled 2018-07-15: qty 50

## 2018-07-15 MED ORDER — FAMOTIDINE IN NACL 20-0.9 MG/50ML-% IV SOLN
20.0000 mg | Freq: Once | INTRAVENOUS | Status: AC
  Administered 2018-07-15: 20 mg via INTRAVENOUS

## 2018-07-15 MED ORDER — SODIUM CHLORIDE 0.9 % IV SOLN
348.0000 mg | Freq: Once | INTRAVENOUS | Status: AC
  Administered 2018-07-15: 350 mg via INTRAVENOUS
  Filled 2018-07-15: qty 35

## 2018-07-15 MED ORDER — SODIUM CHLORIDE 0.9% FLUSH
10.0000 mL | Freq: Once | INTRAVENOUS | Status: AC
  Administered 2018-07-15: 10 mL
  Filled 2018-07-15: qty 10

## 2018-07-15 MED ORDER — PALONOSETRON HCL INJECTION 0.25 MG/5ML
0.2500 mg | Freq: Once | INTRAVENOUS | Status: AC
  Administered 2018-07-15: 0.25 mg via INTRAVENOUS

## 2018-07-15 MED ORDER — SODIUM CHLORIDE 0.9 % IV SOLN
Freq: Once | INTRAVENOUS | Status: AC
  Administered 2018-07-15: 15:00:00 via INTRAVENOUS
  Filled 2018-07-15: qty 250

## 2018-07-15 MED ORDER — DIPHENHYDRAMINE HCL 50 MG/ML IJ SOLN
INTRAMUSCULAR | Status: AC
  Filled 2018-07-15: qty 1

## 2018-07-15 MED ORDER — SODIUM CHLORIDE 0.9 % IV SOLN
Freq: Once | INTRAVENOUS | Status: AC
  Administered 2018-07-15: 15:00:00 via INTRAVENOUS
  Filled 2018-07-15: qty 5

## 2018-07-15 MED ORDER — SODIUM CHLORIDE 0.9% FLUSH
10.0000 mL | INTRAVENOUS | Status: DC | PRN
  Filled 2018-07-15: qty 10

## 2018-07-15 NOTE — Patient Instructions (Signed)
[place] Discharge Instructions for Patients Receiving Chemotherapy  Today you received the following chemotherapy agents Carboplatin (PARAPLATIN).  To help prevent nausea and vomiting after your treatment, we encourage you to take your nausea medication as prescribed. DO NOT TAKE ZOFRAN FOR THE NEXT 3 DAYS, TAKE COMPAZINE.   If you develop nausea and vomiting that is not controlled by your nausea medication, call the clinic.   BELOW ARE SYMPTOMS THAT SHOULD BE REPORTED IMMEDIATELY:  *FEVER GREATER THAN 100.5 F  *CHILLS WITH OR WITHOUT FEVER  NAUSEA AND VOMITING THAT IS NOT CONTROLLED WITH YOUR NAUSEA MEDICATION  *UNUSUAL SHORTNESS OF BREATH  *UNUSUAL BRUISING OR BLEEDING  TENDERNESS IN MOUTH AND THROAT WITH OR WITHOUT PRESENCE OF ULCERS  *URINARY PROBLEMS  *BOWEL PROBLEMS  UNUSUAL RASH Items with * indicate a potential emergency and should be followed up as soon as possible.  Feel free to call the clinic should you have any questions or concerns. The clinic phone number is [phone].  Please show the [place] at check-in to the Emergency Department and triage nurse.  Carboplatin injection What is this medicine? CARBOPLATIN (KAR boe pla tin) is a chemotherapy drug. It targets fast dividing cells, like cancer cells, and causes these cells to die. This medicine is used to treat ovarian cancer and many other cancers. This medicine may be used for other purposes; ask your health care provider or pharmacist if you have questions. COMMON BRAND NAME(S): Paraplatin What should I tell my health care provider before I take this medicine? They need to know if you have any of these conditions: -blood disorders -hearing problems -kidney disease -recent or ongoing radiation therapy -an unusual or allergic reaction to carboplatin, cisplatin, other chemotherapy, other medicines, foods, dyes, or preservatives -pregnant or trying to get  pregnant -breast-feeding How should I use this medicine? This drug is usually given as an infusion into a vein. It is administered in a hospital or clinic by a specially trained health care professional. Talk to your pediatrician regarding the use of this medicine in children. Special care may be needed. Overdosage: If you think you have taken too much of this medicine contact a poison control center or emergency room at once. NOTE: This medicine is only for you. Do not share this medicine with others. What if I miss a dose? It is important not to miss a dose. Call your doctor or health care professional if you are unable to keep an appointment. What may interact with this medicine? -medicines for seizures -medicines to increase blood counts like filgrastim, pegfilgrastim, sargramostim -some antibiotics like amikacin, gentamicin, neomycin, streptomycin, tobramycin -vaccines Talk to your doctor or health care professional before taking any of these medicines: -acetaminophen -aspirin -ibuprofen -ketoprofen -naproxen This list may not describe all possible interactions. Give your health care provider a list of all the medicines, herbs, non-prescription drugs, or dietary supplements you use. Also tell them if you smoke, drink alcohol, or use illegal drugs. Some items may interact with your medicine. What should I watch for while using this medicine? Your condition will be monitored carefully while you are receiving this medicine. You will need important blood work done while you are taking this medicine. This drug may make you feel generally unwell. This is not uncommon, as chemotherapy can affect healthy cells as well as cancer cells. Report any side effects. Continue your course of treatment even though you feel ill unless your doctor tells you to stop. In some cases, you  may be given additional medicines to help with side effects. Follow all directions for their use. Call your doctor or  health care professional for advice if you get a fever, chills or sore throat, or other symptoms of a cold or flu. Do not treat yourself. This drug decreases your body's ability to fight infections. Try to avoid being around people who are sick. This medicine may increase your risk to bruise or bleed. Call your doctor or health care professional if you notice any unusual bleeding. Be careful brushing and flossing your teeth or using a toothpick because you may get an infection or bleed more easily. If you have any dental work done, tell your dentist you are receiving this medicine. Avoid taking products that contain aspirin, acetaminophen, ibuprofen, naproxen, or ketoprofen unless instructed by your doctor. These medicines may hide a fever. Do not become pregnant while taking this medicine. Women should inform their doctor if they wish to become pregnant or think they might be pregnant. There is a potential for serious side effects to an unborn child. Talk to your health care professional or pharmacist for more information. Do not breast-feed an infant while taking this medicine. What side effects may I notice from receiving this medicine? Side effects that you should report to your doctor or health care professional as soon as possible: -allergic reactions like skin rash, itching or hives, swelling of the face, lips, or tongue -signs of infection - fever or chills, cough, sore throat, pain or difficulty passing urine -signs of decreased platelets or bleeding - bruising, pinpoint red spots on the skin, black, tarry stools, nosebleeds -signs of decreased red blood cells - unusually weak or tired, fainting spells, lightheadedness -breathing problems -changes in hearing -changes in vision -chest pain -high blood pressure -low blood counts - This drug may decrease the number of white blood cells, red blood cells and platelets. You may be at increased risk for infections and bleeding. -nausea and  vomiting -pain, swelling, redness or irritation at the injection site -pain, tingling, numbness in the hands or feet -problems with balance, talking, walking -trouble passing urine or change in the amount of urine Side effects that usually do not require medical attention (report to your doctor or health care professional if they continue or are bothersome): -hair loss -loss of appetite -metallic taste in the mouth or changes in taste This list may not describe all possible side effects. Call your doctor for medical advice about side effects. You may report side effects to FDA at 1-800-FDA-1088. Where should I keep my medicine? This drug is given in a hospital or clinic and will not be stored at home. NOTE: This sheet is a summary. It may not cover all possible information. If you have questions about this medicine, talk to your doctor, pharmacist, or health care provider.  2019 Elsevier/Gold Standard (2007-09-01 14:38:05)

## 2018-07-16 ENCOUNTER — Other Ambulatory Visit: Payer: Self-pay | Admitting: Hematology and Oncology

## 2018-07-16 LAB — CA 125: Cancer Antigen (CA) 125: 687 U/mL — ABNORMAL HIGH (ref 0.0–38.1)

## 2018-07-23 ENCOUNTER — Ambulatory Visit: Payer: Medicare Other | Admitting: Hematology and Oncology

## 2018-07-29 IMAGING — US US THORACENTESIS ASP PLEURAL SPACE W/IMG GUIDE
1 series · 3 of 3 positions shown · non-contrast
Comparison: none

INDICATION: Large left pleural effusion noted on recent imaging. Request made
for diagnostic and therapeutic thoracentesis.

[Series 1: us thoracentesis asp pleural space w/img guide · 0.28mm/px · 3 of 3 slices shown]
[im 1/3]
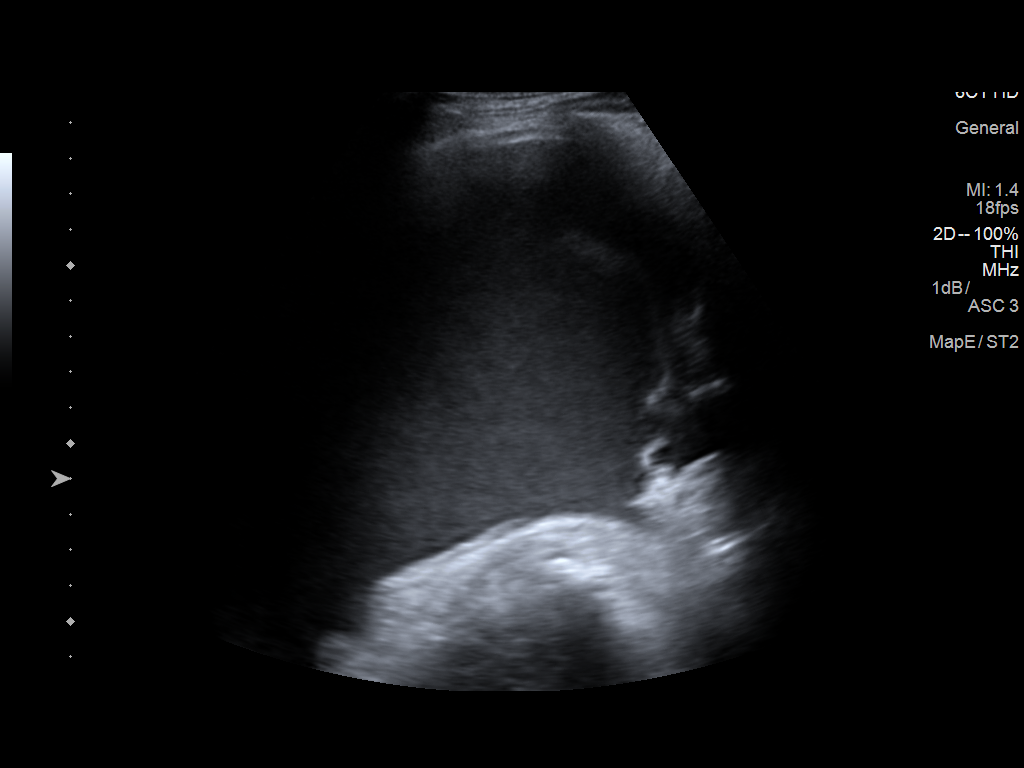
[im 2/3]
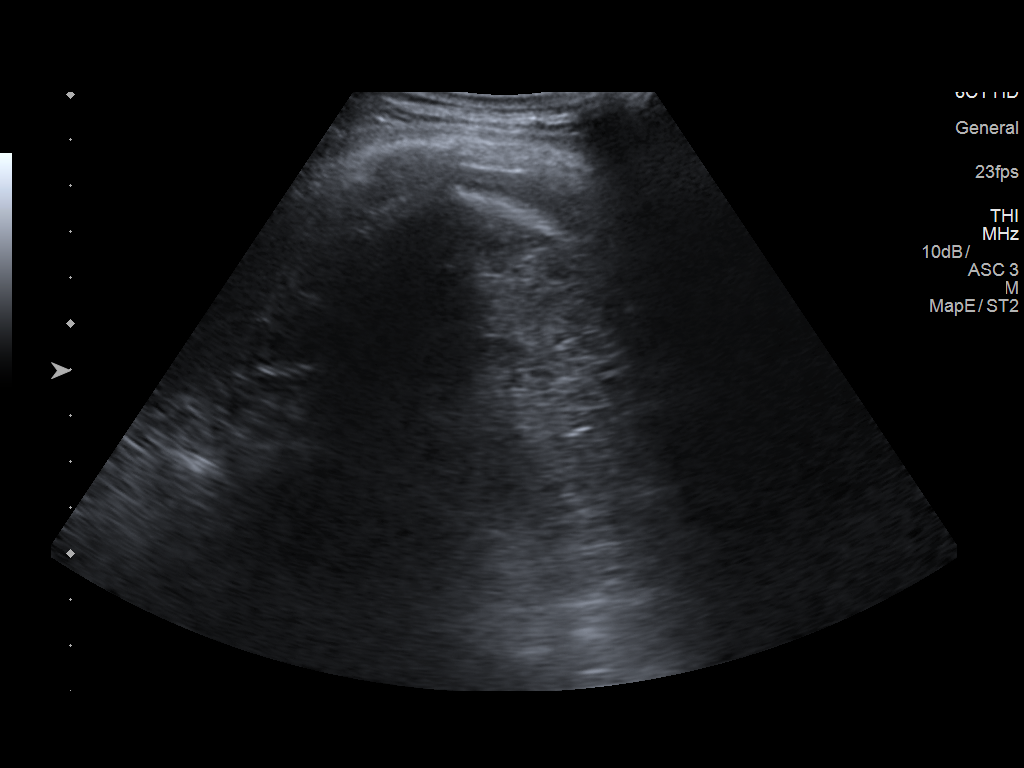
[im 3/3]
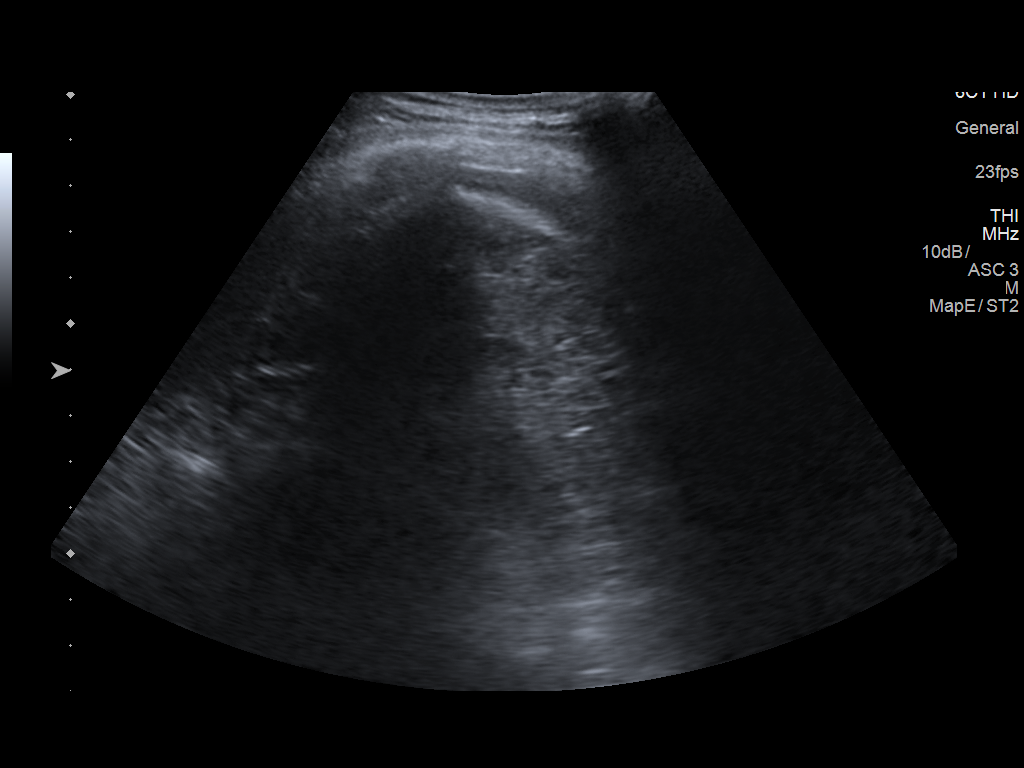

[3 of 3 positions shown; findings below may reference images not displayed]

EXAM:
ULTRASOUND GUIDED DIAGNOSTIC AND THERAPEUTIC THORACENTESIS

MEDICATIONS:
1% lidocaine

COMPLICATIONS:
None immediate.

PROCEDURE:
An ultrasound guided thoracentesis was thoroughly discussed with the
patient's daughter and questions answered. The benefits, risks,
alternatives and complications were also discussed. The patient's
daughter understands and wishes to proceed with the procedure.
Written consent was obtained.

Ultrasound was performed to localize and mark an adequate pocket of
fluid in the left chest. The area was then prepped and draped in the
normal sterile fashion. 1% Lidocaine was used for local anesthesia.
Under ultrasound guidance a Safe-T-Centesis catheter was introduced.
Thoracentesis was performed. The catheter was removed and a dressing
applied.
FINDINGS: A total of approximately 1.6 L of slightly dark, serous fluid was
removed. Samples were sent to the laboratory as requested by the
clinical team.
IMPRESSION: Successful ultrasound guided left thoracentesis yielding 1.6 L of
pleural fluid. The procedure was terminated early secondary to
significant coughing.

## 2018-08-05 ENCOUNTER — Inpatient Hospital Stay: Payer: Medicare Other

## 2018-08-05 ENCOUNTER — Encounter: Payer: Self-pay | Admitting: Hematology and Oncology

## 2018-08-05 ENCOUNTER — Telehealth: Payer: Self-pay | Admitting: Hematology and Oncology

## 2018-08-05 ENCOUNTER — Inpatient Hospital Stay (HOSPITAL_BASED_OUTPATIENT_CLINIC_OR_DEPARTMENT_OTHER): Payer: Medicare Other | Admitting: Hematology and Oncology

## 2018-08-05 VITALS — BP 124/48 | HR 65 | Temp 97.8°F | Resp 18 | Ht 62.0 in | Wt 150.0 lb

## 2018-08-05 DIAGNOSIS — C561 Malignant neoplasm of right ovary: Secondary | ICD-10-CM

## 2018-08-05 DIAGNOSIS — F0390 Unspecified dementia without behavioral disturbance: Secondary | ICD-10-CM

## 2018-08-05 DIAGNOSIS — Z5111 Encounter for antineoplastic chemotherapy: Secondary | ICD-10-CM | POA: Diagnosis not present

## 2018-08-05 DIAGNOSIS — D61818 Other pancytopenia: Secondary | ICD-10-CM

## 2018-08-05 DIAGNOSIS — Z95828 Presence of other vascular implants and grafts: Secondary | ICD-10-CM

## 2018-08-05 LAB — COMPREHENSIVE METABOLIC PANEL
ALT: 19 U/L (ref 0–44)
ANION GAP: 11 (ref 5–15)
AST: 22 U/L (ref 15–41)
Albumin: 3.5 g/dL (ref 3.5–5.0)
Alkaline Phosphatase: 166 U/L — ABNORMAL HIGH (ref 38–126)
BUN: 11 mg/dL (ref 8–23)
CO2: 26 mmol/L (ref 22–32)
Calcium: 9.6 mg/dL (ref 8.9–10.3)
Chloride: 101 mmol/L (ref 98–111)
Creatinine, Ser: 0.92 mg/dL (ref 0.44–1.00)
GFR calc non Af Amer: 56 mL/min — ABNORMAL LOW (ref >60)
Glucose, Bld: 105 mg/dL — ABNORMAL HIGH (ref 70–99)
Potassium: 4.4 mmol/L (ref 3.5–5.1)
Sodium: 138 mmol/L (ref 135–145)
Total Bilirubin: 0.9 mg/dL (ref 0.3–1.2)
Total Protein: 6.7 g/dL (ref 6.5–8.1)

## 2018-08-05 LAB — CBC WITH DIFFERENTIAL/PLATELET
Abs Immature Granulocytes: 0.03 10*3/uL (ref 0.00–0.07)
Basophils Absolute: 0 10*3/uL (ref 0.0–0.1)
Basophils Relative: 1 %
Eosinophils Absolute: 0 10*3/uL (ref 0.0–0.5)
Eosinophils Relative: 1 %
HCT: 27.9 % — ABNORMAL LOW (ref 36.0–46.0)
Hemoglobin: 9.3 g/dL — ABNORMAL LOW (ref 12.0–15.0)
Immature Granulocytes: 1 %
Lymphocytes Relative: 46 %
Lymphs Abs: 1.8 10*3/uL (ref 0.7–4.0)
MCH: 32.4 pg (ref 26.0–34.0)
MCHC: 33.3 g/dL (ref 30.0–36.0)
MCV: 97.2 fL (ref 80.0–100.0)
Monocytes Absolute: 0.4 10*3/uL (ref 0.1–1.0)
Monocytes Relative: 12 %
NRBC: 0 % (ref 0.0–0.2)
Neutro Abs: 1.4 10*3/uL — ABNORMAL LOW (ref 1.7–7.7)
Neutrophils Relative %: 39 %
Platelets: 237 10*3/uL (ref 150–400)
RBC: 2.87 MIL/uL — ABNORMAL LOW (ref 3.87–5.11)
RDW: 16.2 % — AB (ref 11.5–15.5)
WBC: 3.7 10*3/uL — ABNORMAL LOW (ref 4.0–10.5)

## 2018-08-05 MED ORDER — PALONOSETRON HCL INJECTION 0.25 MG/5ML
INTRAVENOUS | Status: AC
  Filled 2018-08-05: qty 5

## 2018-08-05 MED ORDER — HEPARIN SOD (PORK) LOCK FLUSH 100 UNIT/ML IV SOLN
500.0000 [IU] | Freq: Once | INTRAVENOUS | Status: AC | PRN
  Administered 2018-08-05: 500 [IU]
  Filled 2018-08-05: qty 5

## 2018-08-05 MED ORDER — DIPHENHYDRAMINE HCL 50 MG/ML IJ SOLN
INTRAMUSCULAR | Status: AC
  Filled 2018-08-05: qty 1

## 2018-08-05 MED ORDER — SODIUM CHLORIDE 0.9 % IV SOLN
Freq: Once | INTRAVENOUS | Status: AC
  Administered 2018-08-05: 12:00:00 via INTRAVENOUS
  Filled 2018-08-05: qty 250

## 2018-08-05 MED ORDER — SODIUM CHLORIDE 0.9% FLUSH
10.0000 mL | Freq: Once | INTRAVENOUS | Status: AC
  Administered 2018-08-05: 10 mL
  Filled 2018-08-05: qty 10

## 2018-08-05 MED ORDER — SODIUM CHLORIDE 0.9% FLUSH
10.0000 mL | INTRAVENOUS | Status: DC | PRN
  Administered 2018-08-05: 10 mL
  Filled 2018-08-05: qty 10

## 2018-08-05 MED ORDER — SODIUM CHLORIDE 0.9 % IV SOLN
320.0000 mg | Freq: Once | INTRAVENOUS | Status: AC
  Administered 2018-08-05: 320 mg via INTRAVENOUS
  Filled 2018-08-05: qty 32

## 2018-08-05 MED ORDER — DIPHENHYDRAMINE HCL 50 MG/ML IJ SOLN
25.0000 mg | Freq: Once | INTRAMUSCULAR | Status: AC
  Administered 2018-08-05: 25 mg via INTRAVENOUS

## 2018-08-05 MED ORDER — SODIUM CHLORIDE 0.9 % IV SOLN
20.0000 mg | Freq: Once | INTRAVENOUS | Status: AC
  Administered 2018-08-05: 20 mg via INTRAVENOUS
  Filled 2018-08-05: qty 2

## 2018-08-05 MED ORDER — SODIUM CHLORIDE 0.9 % IV SOLN
Freq: Once | INTRAVENOUS | Status: AC
  Administered 2018-08-05: 13:00:00 via INTRAVENOUS
  Filled 2018-08-05: qty 5

## 2018-08-05 MED ORDER — PALONOSETRON HCL INJECTION 0.25 MG/5ML
0.2500 mg | Freq: Once | INTRAVENOUS | Status: AC
  Administered 2018-08-05: 0.25 mg via INTRAVENOUS

## 2018-08-05 NOTE — Patient Instructions (Addendum)
[facility] Discharge Instructions for Patients Receiving Chemotherapy  Today you received the following chemotherapy agents Carboplatin (PARAPLATIN).  To help prevent nausea and vomiting after your treatment, we encourage you to take your nausea medication as prescribed. DO NOT TAKE ZOFRAN FOR THE NEXT 3 DAYS, TAKE COMPAZINE.   If you develop nausea and vomiting that is not controlled by your nausea medication, call the clinic.   BELOW ARE SYMPTOMS THAT SHOULD BE REPORTED IMMEDIATELY:  *FEVER GREATER THAN 100.5 F  *CHILLS WITH OR WITHOUT FEVER  NAUSEA AND VOMITING THAT IS NOT CONTROLLED WITH YOUR NAUSEA MEDICATION  *UNUSUAL SHORTNESS OF BREATH  *UNUSUAL BRUISING OR BLEEDING  TENDERNESS IN MOUTH AND THROAT WITH OR WITHOUT PRESENCE OF ULCERS  *URINARY PROBLEMS  *BOWEL PROBLEMS  UNUSUAL RASH Items with * indicate a potential emergency and should be followed up as soon as possible.  Feel free to call the clinic should you have any questions or concerns. The clinic phone number is [phone].  Please show the [facility] at check-in to the Emergency Department and triage nurse.   Neutropenia Neutropenia is a condition that occurs when you have a lower-than-normal level of a type of white blood cell (neutrophil) in your body. Neutrophils are made in the spongy center of large bones (bone marrow) and they fight infections. Neutrophils are your body's main defense against bacterial and fungal infections. The fewer neutrophils you have and the longer your body remains without them, the greater your risk of getting a severe infection. What are the causes? This condition can occur if your body uses up or destroys neutrophils faster than your bone marrow can make them. This problem may happen because of:  Bacterial or fungal infection.  Allergic disorders.  Reactions to some medicines.  Autoimmune disease.  An enlarged spleen. This condition can also  occur if your bone marrow does not produce enough neutrophils. This problem may be caused by:  Cancer.  Cancer treatments, such as radiation or chemotherapy.  Viral infections.  Medicines, such as phenytoin.  Vitamin B12 deficiency.  Diseases of the bone marrow.  Environmental toxins, such as insecticides. What are the signs or symptoms? This condition does not usually cause symptoms. If symptoms are present, they are usually caused by an underlying infection. Symptoms of an infection may include:  Fever.  Chills.  Swollen glands.  Oral or anal ulcers.  Cough and shortness of breath.  Rash.  Skin infection.  Fatigue. How is this diagnosed? Your health care provider may suspect neutropenia if you have:  A condition that may cause neutropenia.  Symptoms of infection, especially fever.  Frequent and unusual infections. You will have a medical history and physical exam. Tests will also be done, such as:  A complete blood count (CBC).  A procedure to collect a sample of bone marrow for examination (bone marrow biopsy).  A chest X-ray.  A urine culture.  A blood culture. How is this treated? Treatment depends on the underlying cause and severity of your condition. Mild neutropenia may not require treatment. Treatment may include medicines, such as:  Antibiotic medicine given through an IV tube.  Antiviral medicines.  Antifungal medicines.  A medicine to increase neutrophil production (colony-stimulating factor). You may get this drug through an IV tube or by injection.  Steroids given through an IV tube. If an underlying condition is causing neutropenia, you may need treatment for that condition. If medicines you are taking are causing neutropenia, your health care provider  may have you stop taking those medicines. Follow these instructions at home: Medicines   Take over-the-counter and prescription medicines only as told by your health care  provider.  Get a seasonal flu shot (influenza vaccine). Lifestyle  Do not eat unpasteurized foods.Do not eat unwashed raw fruits or vegetables.  Avoid exposure to groups of people or children.  Avoid being around people who are sick.  Avoid being around dirt or dust, such as in construction areas or gardens.  Do not provide direct care for pets. Avoid animal droppings. Do not clean litter boxes and bird cages. Hygiene   Bathe daily.  Clean the area between the genitals and the anus (perineal area) after you urinate or have a bowel movement. If you are female, wipe from front to back.  Brush your teeth with a soft toothbrush before and after meals.  Do not use a razor that has a blade. Use an electric razor to remove hair.  Wash your hands often. Make sure others who come in contact with you also wash their hands. If soap and water are not available, use hand sanitizer. General instructions  Do not have sex unless your health care provider has approved.  Take actions to avoid cuts and burns. For example: ? Be cautious when you use knives. Always cut away from yourself. ? Keep knives in protective sheaths or guards when not in use. ? Use oven mitts when you cook with a hot stove, oven, or grill. ? Stand a safe distance away from open fires.  Avoid people who received a vaccine in the past 30 days if that vaccine contained a live version of the germ (live vaccine). You should not get a live vaccine. Common live vaccines are varicella, measles, mumps, and rubella.  Do not share food utensils.  Do not use tampons, enemas, or rectal suppositories unless your health care provider has approved.  Keep all appointments as told by your health care provider. This is important. Contact a health care provider if:  You have a fever.  You have chills or you start to shake.  You have: ? A sore throat. ? A warm, red, or tender area on your skin. ? A cough. ? Frequent or painful  urination. ? Vaginal discharge or itching.  You develop: ? Sores in your mouth or anus. ? Swollen lymph nodes. ? Red streaks on the skin. ? A rash.  You feel: ? Nauseous or you vomit. ? Very fatigued. ? Short of breath. This information is not intended to replace advice given to you by your health care provider. Make sure you discuss any questions you have with your health care provider. Document Released: 11/16/2001 Document Revised: 01/21/2018 Document Reviewed: 12/07/2014 Elsevier Interactive Patient Education  2019 [employer].

## 2018-08-05 NOTE — Telephone Encounter (Signed)
Gave avs and calendar ° °

## 2018-08-06 ENCOUNTER — Telehealth: Payer: Self-pay

## 2018-08-06 LAB — CA 125: CANCER ANTIGEN (CA) 125: 382 U/mL — AB (ref 0.0–38.1)

## 2018-08-06 NOTE — Assessment & Plan Note (Signed)
So far, she tolerated treatment very well except for mild pancytopenia which she is not symptomatic Clinically, she have positive response to treatment I recommend 3 cycles of chemotherapy before repeat imaging study

## 2018-08-06 NOTE — Progress Notes (Signed)
[place] OFFICE PROGRESS NOTE  Patient Care Team: Monico Blitz, MD as PCP - General (Internal Medicine)  ASSESSMENT & PLAN:  Ovarian cancer on right [facility]) So far, she tolerated treatment very well except for mild pancytopenia which she is not symptomatic Clinically, she have positive response to treatment I recommend 3 cycles of chemotherapy before repeat imaging study  Acquired pancytopenia ([place]) She has mild progressive pancytopenia due to treatment She is not symptomatic I plan to reduce the dose of carboplatin  Dementia She has no recent exacerbation of dementia.  We will continue chemotherapy   Orders Placed This Encounter  Procedures  . CT ABDOMEN PELVIS W CONTRAST    Standing Status:   Future    Standing Expiration Date:   08/06/2019    Order Specific Question:   If indicated for the ordered procedure, I authorize the administration of contrast media per Radiology protocol    Answer:   Yes    Order Specific Question:   Preferred imaging location?    Answer:   [facility]    Order Specific Question:   Radiology Contrast Protocol - do NOT remove file path    Answer:   [email or web address]    INTERVAL HISTORY: Please see below for problem oriented charting. She returns with her daughter for further chemotherapy and follow-up Since last time I saw her, she feels well No nausea, bloating, constipation or diarrhea No recent infection, fever or chills No recent behavior changes The patient denies any recent signs or symptoms of bleeding such as spontaneous epistaxis, hematuria or hematochezia.   SUMMARY OF ONCOLOGIC HISTORY: Oncology History   Negative BRCA in tumor analysis and blood test     Ovarian cancer on right [facility])   12/07/2016 Imaging    Ct angiogram:  No evidence of pulmonary embolus.  Large left and small right pleural effusions. Complete collapse of the left lower lobe, partial collapse of the lingula  and milder atelectasis of the left upper lobe, likely due to compressive atelectasis. The collapsed lung parenchyma is poorly evaluated, but no obvious pulmonary masses are seen.  Interstitial pulmonary edema.  Small volume abdominal ascites.  Heterogeneous appearance of the thyroid gland, with right sided nodules.  Aortic Atherosclerosis (ICD10-I70.0).    12/07/2016 - 12/13/2016 Hospital Admission    She was admitted for generalized malaise, abnormal blood work, large bilateral pleural effusion and possible right ovarian cancer    12/08/2016 Imaging    LV EF: 65% -   70%    12/08/2016 Pathology Results    PLEURAL FLUID, LEFT (SPECIMEN 1 OF 1, COLLECTED ON 12/08/16): - MALIGNANT CELLS CONSISTENT WITH METASTATIC ADENOCARCINOMA, SEE COMMENT    12/08/2016 Procedure    Successful ultrasound guided left thoracentesis yielding 1.6 L of pleural fluid. The procedure was terminated early secondary to significant coughing.    12/12/2016 Imaging    CT scan of abdomen and pelvis 1. Septated right adnexal cyst measuring 8.5 x 6.5 cm, concerning for ovarian malignancy in this setting. Small volume complex intra-abdominal ascites which tracks in the pericolic gutters and anterior omentum. No definite soft tissue omental caking. 2. Endometrial thickening versus fluid in the endometrial canal, nonspecific. Rounded 3.9 cm lesion in the uterine fundus may be a fibroid, however would be unusual for patient's age. Endometrial/ uterine neoplasm is considered. 3. Nonspecific ill-defined low-density lesion in the right lobe of the liver. Probable hepatic hemangioma at the dome. 4. Further evaluation of the GYN structures could be performed with  ultrasound. MRI may provide further detail if needed for potential surgical planning. 5. Aortic atherosclerosis    12/12/2016 Imaging    CXR 1. Progressive left base atelectasis and infiltrate. Small left pleural effusion again noted. 2.  Cardiomegaly, no evidence of pulmonary  venous congestion.    12/13/2016 Tumor Marker    Patient's tumor was tested for the following markers: CA-125. Results of the tumor marker test revealed 1310.    04-04-202018 Procedure    Ultrasound and fluoroscopically guided right internal jugular single lumen power port catheter insertion. Tip in the SVC/RA junction. Catheter ready for use.    01/01/2017 - 07/03/2017 Chemotherapy    She received carboplatin and Taxol x 5 cycles, interrupted in November for interval debulking surgery, and resumed to receive 3 more cycles from December to January 2019    01/07/2017 - 01/11/2017 Hospital Admission    She was admitted for severe nausea/vomiting and pleural effusion.    01/08/2017 Procedure    Successful ultrasound guided left thoracentesis yielding 1750 mL of pleural fluid    01/08/2017 Imaging    Marked improvement in left pleural effusion following thoracentesis. Negative for pneumothorax    01/22/2017 Adverse Reaction    Second dose of chemo requires significant dose adjustment and GCSF support    02/13/2017 Tumor Marker    Patient's tumor was tested for the following markers: CA-125. Results of the tumor marker test revealed 310.6    03/05/2017 Imaging    1. Persistent mild ventral peritoneal / omental nodularity in the LEFT abdomen concerning for peritoneal carcinomatosis. 2. Interval reduction in free fluid the abdomen pelvis. 3. Slight decrease in volume of large RIGHT adnexal cystic mass. 4. Stable LEFT pleural effusion.    03/06/2017 Tumor Marker    Patient's tumor was tested for the following markers: CA-125. Results of the tumor marker test revealed 128.1    03/27/2017 Tumor Marker    Patient's tumor was tested for the following markers: CA-125. Results of the tumor marker test revealed 96.5    04/22/2017 Pathology Results    1. Uterus +/- tubes/ovaries, neoplastic - RIGHT OVARY: HIGH GRADE CARCINOMA, SPANNING 7 CM, SEE COMMENT. PSAMMOMATOUS DEPOSITS ON SURFACE. SEE ONCOLOGY  TABLE. - LEFT MESO-OVARIUM: METASTATIC HIGH GRADE CARCINOMA. DEFINITIVE OVARIAN TISSUE NOT IDENTIFIED. - UTERUS: -ENDOMETRIUM: BENIGN ENDOMETRIAL TYPE POLYP. NO MALIGNANCY IDENTIFIED. -MYOMETRIUM: -SEROSA: PSAMMOMATOUS DEPOSITS. NO VIABLE TUMOR IDENTIFIED. - CERVIX: NO DYSPLASIA OR MALIGNANCY. - BILATERAL FALLOPIAN TUBES: RIGHT: METASTATIC HIGH GRADE CARCINOMA. PARATUBAL CYSTS. LEFT: PSAMMOMATOUS DEPOSITS. 2. Omentum, biopsy - METASTATIC HIGH GRADE CARCINOMA. Microscopic Comment 1. OVARY Specimen(s): Uterus, cervix, bilateral fallopian tubes, right ovary, omentum. Procedure: (including lymph node sampling): Total hysterectomy with bilateral salpingo-oophorectomy and omental biopsy. Primary tumor site (including laterality): Right ovary. Ovarian surface involvement: Present. Ovarian capsule intact without fragmentation: Yes. Maximum tumor size (cm): 7 cm. Histologic type: High grade carcinoma, see comment. Grade: High grade. Peritoneal implants: (specify invasive or non-invasive): Present, invasive. Pelvic extension (list additional structures on separate lines and if involved): Right fallopian tube, left meso-ovarium, omentum (>2 cm deposit). Lymph nodes: number examined 0 ; number positive 0  TNM code: pT3c, pN0, pMX FIGO Stage (based on pathologic findings, needs clinical correlation): IIIC Comments: The tumor consists of large papillary type excrescences with high grade tumor. There are clear cell features and the tumor is negative for WT-1 and p53, thus the tumor is favored to be a clear cell carcinoma or at least have had a component of clear cell carcinoma (residual after treatment).  Dr. Lyndon Code has reviewed select slides    04/22/2017 Surgery    Pre-operative Diagnosis: stage IV ovarian cancer, s/p neoadjuvant chemotherapy  Post-operative Diagnosis: same  Operation: Robotic-assisted laparoscopic total hysterectomy with bilateral salpingoophorectomy, ex lap, omentectomy,  mobilzation of splenic flexure, radical tumor debulking.  Surgeon: Donaciano Eva  Assistant Surgeon: Lahoma Crocker MD  Operative Findings:  : 7cm right ovarian cyst,  4cm tumor knot in left upper quadrant adherent to the splenic flexure of the colon and stomach.  No gross residual disease at the end of the procedure representing an optimal cytoreduction.      05/23/2017 Tumor Marker    Patient's tumor was tested for the following markers: CA-125. Results of the tumor marker test revealed 69.4    06/13/2017 Tumor Marker    Patient's tumor was tested for the following markers: CA-125. Results of the tumor marker test revealed 56    06/13/2017 Genetic Testing    Patient has genetic testing done for genetics with [facility] Results revealed patient has no mutations     07/03/2017 Tumor Marker    Patient's tumor was tested for the following markers: CA-125. Results of the tumor marker test revealed 51.1    07/17/2017 Tumor Marker    Patient's tumor was tested for the following markers: CA-125. Results of the tumor marker test revealed 43    09/27/2017 Tumor Marker    Patient's tumor was tested for the following markers: CA-125. Results of the tumor marker test revealed 30.1    12/30/2017 Tumor Marker    Patient's tumor was tested for the following markers: CA-125. Results of the tumor marker test revealed 31.5    03/15/2018 Tumor Marker    Patient's tumor was tested for the following markers: CA-125. Results of the tumor marker test revealed 36.2    07/09/2018 Tumor Marker    Patient's tumor was tested for the following markers: CA-125. Results of the tumor marker test revealed 532    07/09/2018 Imaging    1. Again noted is a large mass within the right adrenal bed which appears mixed in attenuation and has mass effect upon the upper pole of the right kidney and the IVC. The primary differential considerations are for either adrenal hemorrhage versus metastatic disease  from patient's known ovarian carcinoma. A MRI of the adrenal glands may be helpful to assess the underlying enhancement characteristics of this lesion as well as assess for presence of blood products. 2. New left retroperitoneal lymph node is borderline enlarged measuring 1 cm. Additionally, in the left adnexal region there is a 3.1 x 2.0 cm soft tissue mass. In a patient who is status post bilateral oophorectomy for ovarian neoplasm this is suspicious for recurrence of disease. 3.  Aortic Atherosclerosis (ICD10-I70.0).     07/15/2018 -  Chemotherapy    The patient had carboplatin only    07/15/2018 Tumor Marker    Patient's tumor was tested for the following markers: CA-125. Results of the tumor marker test revealed 450    08/05/2018 Tumor Marker    Patient's tumor was tested for the following markers: CA-125. Results of the tumor marker test revealed 382     REVIEW OF SYSTEMS:   Constitutional: Denies fevers, chills or abnormal weight loss Eyes: Denies blurriness of vision Ears, nose, mouth, throat, and face: Denies mucositis or sore throat Respiratory: Denies cough, dyspnea or wheezes Cardiovascular: Denies palpitation, chest discomfort or lower extremity swelling Gastrointestinal:  Denies nausea, heartburn or change in bowel habits Skin:  Denies abnormal skin rashes Lymphatics: Denies new lymphadenopathy or easy bruising Neurological:Denies numbness, tingling or new weaknesses Behavioral/Psych: Mood is stable, no new changes  All other systems were reviewed with the patient and are negative.  I have reviewed the past medical history, past surgical history, social history and family history with the patient and they are unchanged from previous note.  ALLERGIES:  is allergic to tetanus toxoids and codeine.  MEDICATIONS:  Current Outpatient Medications  Medication Sig Dispense Refill  . apixaban (ELIQUIS) 2.5 MG TABS tablet Take 2.5 mg by mouth 2 (two) times daily.    . Ascorbic  Acid (VITAMIN C) 1000 MG tablet Take 1,000 mg by mouth daily.    . bisoprolol (ZEBETA) 10 MG tablet Take 10 mg by mouth daily.    . Cholecalciferol (VITAMIN D) 2000 units tablet Take 2,000 Units by mouth daily.    . furosemide (LASIX) 20 MG tablet Take 1 tablet (20 mg total) by mouth daily. 30 tablet 0  . Krill Oil 500 MG CAPS Take 500 mg by mouth daily.    Marland Kitchen lidocaine-prilocaine (EMLA) cream Apply to affected area once (Patient taking differently: Apply 1 application topically See admin instructions. Apply to affected area once) 30 g 3  . Multiple Vitamins-Minerals (MULTI FOR HER 50+) TABS Take 1 tablet by mouth daily.    . ondansetron (ZOFRAN) 4 MG tablet Take 4 mg by mouth every 8 (eight) hours as needed for nausea or vomiting.    . prochlorperazine (COMPAZINE) 10 MG tablet Take 1 tablet (10 mg total) by mouth every 6 (six) hours as needed (Nausea or vomiting). 30 tablet 1  . simvastatin (ZOCOR) 20 MG tablet Take 20 mg by mouth daily at 6 PM.      No current facility-administered medications for this visit.    Facility-Administered Medications Ordered in Other Visits  Medication Dose Route Frequency Provider Last Rate Last Dose  . heparin lock flush 100 unit/mL  500 Units Intravenous Once Cross, Melissa D, NP      . sodium chloride flush (NS) 0.9 % injection 10 mL  10 mL Intravenous PRN Cross, Melissa D, NP        PHYSICAL EXAMINATION: ECOG PERFORMANCE STATUS: 0 - Asymptomatic  Vitals:   08/05/18 1146  BP: (!) 124/48  Pulse: 65  Resp: 18  Temp: 97.8 F (36.6 C)  SpO2: 99%   Filed Weights   08/05/18 1146  Weight: 150 lb (68 kg)    GENERAL:alert, no distress and comfortable SKIN: skin color, texture, turgor are normal, no rashes or significant lesions EYES: normal, Conjunctiva are pink and non-injected, sclera clear OROPHARYNX:no exudate, no erythema and lips, buccal mucosa, and tongue normal  NECK: supple, thyroid normal size, non-tender, without nodularity LYMPH:  no  palpable lymphadenopathy in the cervical, axillary or inguinal LUNGS: clear to auscultation and percussion with normal breathing effort HEART: regular rate & rhythm and no murmurs and no lower extremity edema ABDOMEN:abdomen soft, non-tender and normal bowel sounds Musculoskeletal:no cyanosis of digits and no clubbing  NEURO: alert & oriented x 3 with fluent speech, no focal motor/sensory deficits  LABORATORY DATA:  I have reviewed the data as listed    Component Value Date/Time   NA 138 08/05/2018 1130   NA 137 06/13/2017 0925   K 4.4 08/05/2018 1130   K 3.9 06/13/2017 0925   CL 101 08/05/2018 1130   CO2 26 08/05/2018 1130   CO2 24 06/13/2017 0925   GLUCOSE 105 (H) 08/05/2018 1130  GLUCOSE 163 (H) 06/13/2017 0925   BUN 11 08/05/2018 1130   BUN 21.0 06/13/2017 0925   CREATININE 0.92 08/05/2018 1130   CREATININE 0.95 07/09/2018 0947   CREATININE 0.8 06/13/2017 0925   CALCIUM 9.6 08/05/2018 1130   CALCIUM 9.7 06/13/2017 0925   PROT 6.7 08/05/2018 1130   PROT 6.7 06/13/2017 0925   ALBUMIN 3.5 08/05/2018 1130   ALBUMIN 3.6 06/13/2017 0925   AST 22 08/05/2018 1130   AST 25 06/13/2017 0925   ALT 19 08/05/2018 1130   ALT 33 06/13/2017 0925   ALKPHOS 166 (H) 08/05/2018 1130   ALKPHOS 125 06/13/2017 0925   BILITOT 0.9 08/05/2018 1130   BILITOT 0.32 06/13/2017 0925   GFRNONAA 56 (L) 08/05/2018 1130   GFRNONAA 54 (L) 07/09/2018 0947   GFRAA >60 08/05/2018 1130   GFRAA >60 07/09/2018 0947    No results found for: SPEP, UPEP  Lab Results  Component Value Date   WBC 3.7 (L) 08/05/2018   NEUTROABS 1.4 (L) 08/05/2018   HGB 9.3 (L) 08/05/2018   HCT 27.9 (L) 08/05/2018   MCV 97.2 08/05/2018   PLT 237 08/05/2018      Chemistry      Component Value Date/Time   NA 138 08/05/2018 1130   NA 137 06/13/2017 0925   K 4.4 08/05/2018 1130   K 3.9 06/13/2017 0925   CL 101 08/05/2018 1130   CO2 26 08/05/2018 1130   CO2 24 06/13/2017 0925   BUN 11 08/05/2018 1130   BUN 21.0  06/13/2017 0925   CREATININE 0.92 08/05/2018 1130   CREATININE 0.95 07/09/2018 0947   CREATININE 0.8 06/13/2017 0925      Component Value Date/Time   CALCIUM 9.6 08/05/2018 1130   CALCIUM 9.7 06/13/2017 0925   ALKPHOS 166 (H) 08/05/2018 1130   ALKPHOS 125 06/13/2017 0925   AST 22 08/05/2018 1130   AST 25 06/13/2017 0925   ALT 19 08/05/2018 1130   ALT 33 06/13/2017 0925   BILITOT 0.9 08/05/2018 1130   BILITOT 0.32 06/13/2017 0925       RADIOGRAPHIC STUDIES: I have personally reviewed the radiological images as listed and agreed with the findings in the report. Ct Abdomen Pelvis W Contrast  Result Date: 07/09/2018 CLINICAL DATA:  History of ovarian cancer.  Restaging. EXAM: CT ABDOMEN AND PELVIS WITH CONTRAST TECHNIQUE: Multidetector CT imaging of the abdomen and pelvis was performed using the standard protocol following bolus administration of intravenous contrast. CONTRAST:  13m OMNIPAQUE IOHEXOL 300 MG/ML  SOLN COMPARISON:  06/18/2018 FINDINGS: Lower chest: There are small bilateral pleural effusions. Subsegmental atelectasis noted in the left lung base. Hepatobiliary: Stable lesion along the posterior dome of liver measuring 1 cm, previously characterized as a benign hemangioma, image 10/2. The previously noted small enhancing lesion within right lobe of liver, also small hemangioma is unchanged measuring less than 1 cm, image 19/2. No suspicious liver lesion identified at this time. Gallstones are again noted. No biliary dilatation. Pancreas: Unremarkable. No pancreatic ductal dilatation or surrounding inflammatory changes. Spleen: Normal in size without focal abnormality. Adrenals/Urinary Tract: Normal left adrenal gland. A normal right adrenal gland is not visualized. Large right suprarenal mass is identified measuring 7.8 by 7.5 by 7.8 cm. On CT from 06/18/2018 this measured 6.4 x 5.7 by 6.9 cm. This contains amorphous areas of increased attenuation measuring up to 134 this has mass  effect upon the upper pole of right kidney and the IVC. Small bilateral low-density kidney lesions are too small  to reliably characterize. No hydronephrosis identified bilaterally. The urinary bladder appears normal. Stomach/Bowel: Small hiatal hernia. No abnormal dilatation of the small or large bowel loops. No small bowel wall thickening or inflammation. Vascular/Lymphatic: Aortic atherosclerosis. Left retroperitoneal lymph node measures 1 cm, image 26/2. Previously 0.4 cm. No pelvic or inguinal adenopathy. Reproductive: The patient is status post hysterectomy. By report the patient is also status post bilateral salpingo oophorectomy. In the left adnexal region there is a soft tissue mass which measures 3.1 by 2.0 by 2.6 cm. No right adnexal mass identified. Other: No ascites. Musculoskeletal: Marked scoliosis deformity with lumbar degenerative disc disease. IMPRESSION: 1. Again noted is a large mass within the right adrenal bed which appears mixed in attenuation and has mass effect upon the upper pole of the right kidney and the IVC. The primary differential considerations are for either adrenal hemorrhage versus metastatic disease from patient's known ovarian carcinoma. A MRI of the adrenal glands may be helpful to assess the underlying enhancement characteristics of this lesion as well as assess for presence of blood products. 2. New left retroperitoneal lymph node is borderline enlarged measuring 1 cm. Additionally, in the left adnexal region there is a 3.1 x 2.0 cm soft tissue mass. In a patient who is status post bilateral oophorectomy for ovarian neoplasm this is suspicious for recurrence of disease. 3.  Aortic Atherosclerosis (ICD10-I70.0). Electronically Signed   By: Kerby Moors M.D.   On: 07/09/2018 15:26    All questions were answered. The patient knows to call the clinic with any problems, questions or concerns. No barriers to learning was detected.  I spent 15 minutes counseling the patient  face to face. The total time spent in the appointment was 20 minutes and more than 50% was on counseling and review of test results  Heath Lark, MD 08/06/2018 8:23 AM

## 2018-08-06 NOTE — Telephone Encounter (Signed)
-----   Message from Heath Lark, MD sent at 08/06/2018  7:59 AM EST ----- Regarding: CA-125 Pls call her daughter and let her know the tumor marker has improved ----- Message ----- From: Interface, Lab In [place] Sent: 08/05/2018  11:49 AM EST To: Heath Lark, MD

## 2018-08-06 NOTE — Telephone Encounter (Signed)
Called and given below message. She verbalized understanding. 

## 2018-08-06 NOTE — Assessment & Plan Note (Signed)
She has mild progressive pancytopenia due to treatment She is not symptomatic I plan to reduce the dose of carboplatin

## 2018-08-06 NOTE — Assessment & Plan Note (Signed)
She has no recent exacerbation of dementia.  We will continue chemotherapy

## 2018-08-25 ENCOUNTER — Inpatient Hospital Stay: Payer: Medicare Other | Admitting: Hematology and Oncology

## 2018-08-25 ENCOUNTER — Other Ambulatory Visit: Payer: Self-pay

## 2018-08-25 ENCOUNTER — Inpatient Hospital Stay: Payer: Medicare Other

## 2018-08-25 ENCOUNTER — Inpatient Hospital Stay: Payer: Medicare Other | Attending: Gynecologic Oncology

## 2018-08-25 ENCOUNTER — Telehealth: Payer: Self-pay | Admitting: Hematology and Oncology

## 2018-08-25 DIAGNOSIS — C561 Malignant neoplasm of right ovary: Secondary | ICD-10-CM | POA: Insufficient documentation

## 2018-08-25 DIAGNOSIS — D61810 Antineoplastic chemotherapy induced pancytopenia: Secondary | ICD-10-CM

## 2018-08-25 DIAGNOSIS — Z95828 Presence of other vascular implants and grafts: Secondary | ICD-10-CM

## 2018-08-25 DIAGNOSIS — Z5111 Encounter for antineoplastic chemotherapy: Secondary | ICD-10-CM | POA: Insufficient documentation

## 2018-08-25 DIAGNOSIS — D61818 Other pancytopenia: Secondary | ICD-10-CM

## 2018-08-25 LAB — CBC WITH DIFFERENTIAL/PLATELET
Abs Immature Granulocytes: 0.01 10*3/uL (ref 0.00–0.07)
BASOS PCT: 0 %
Basophils Absolute: 0 10*3/uL (ref 0.0–0.1)
Eosinophils Absolute: 0 10*3/uL (ref 0.0–0.5)
Eosinophils Relative: 0 %
HCT: 26.2 % — ABNORMAL LOW (ref 36.0–46.0)
HEMOGLOBIN: 8.7 g/dL — AB (ref 12.0–15.0)
Immature Granulocytes: 0 %
LYMPHS PCT: 50 %
Lymphs Abs: 1.7 10*3/uL (ref 0.7–4.0)
MCH: 33.5 pg (ref 26.0–34.0)
MCHC: 33.2 g/dL (ref 30.0–36.0)
MCV: 100.8 fL — ABNORMAL HIGH (ref 80.0–100.0)
Monocytes Absolute: 0.4 10*3/uL (ref 0.1–1.0)
Monocytes Relative: 11 %
NEUTROS ABS: 1.4 10*3/uL — AB (ref 1.7–7.7)
Neutrophils Relative %: 39 %
Platelets: 97 10*3/uL — ABNORMAL LOW (ref 150–400)
RBC: 2.6 MIL/uL — AB (ref 3.87–5.11)
RDW: 20.3 % — ABNORMAL HIGH (ref 11.5–15.5)
WBC: 3.5 10*3/uL — AB (ref 4.0–10.5)
nRBC: 0 % (ref 0.0–0.2)

## 2018-08-25 LAB — COMPREHENSIVE METABOLIC PANEL
ALT: 18 U/L (ref 0–44)
AST: 22 U/L (ref 15–41)
Albumin: 3.7 g/dL (ref 3.5–5.0)
Alkaline Phosphatase: 115 U/L (ref 38–126)
Anion gap: 12 (ref 5–15)
BUN: 10 mg/dL (ref 8–23)
CO2: 24 mmol/L (ref 22–32)
CREATININE: 0.94 mg/dL (ref 0.44–1.00)
Calcium: 9.8 mg/dL (ref 8.9–10.3)
Chloride: 104 mmol/L (ref 98–111)
GFR calc non Af Amer: 55 mL/min — ABNORMAL LOW (ref >60)
Glucose, Bld: 96 mg/dL (ref 70–99)
Potassium: 4.3 mmol/L (ref 3.5–5.1)
SODIUM: 140 mmol/L (ref 135–145)
Total Bilirubin: 1.1 mg/dL (ref 0.3–1.2)
Total Protein: 6.8 g/dL (ref 6.5–8.1)

## 2018-08-25 MED ORDER — SODIUM CHLORIDE 0.9 % IV SOLN
300.0000 mg | Freq: Once | INTRAVENOUS | Status: AC
  Administered 2018-08-25: 300 mg via INTRAVENOUS
  Filled 2018-08-25: qty 30

## 2018-08-25 MED ORDER — DIPHENHYDRAMINE HCL 50 MG/ML IJ SOLN
25.0000 mg | Freq: Once | INTRAMUSCULAR | Status: AC
  Administered 2018-08-25: 25 mg via INTRAVENOUS

## 2018-08-25 MED ORDER — SODIUM CHLORIDE 0.9 % IV SOLN
Freq: Once | INTRAVENOUS | Status: AC
  Administered 2018-08-25: 12:00:00 via INTRAVENOUS
  Filled 2018-08-25: qty 250

## 2018-08-25 MED ORDER — PALONOSETRON HCL INJECTION 0.25 MG/5ML
0.2500 mg | Freq: Once | INTRAVENOUS | Status: AC
  Administered 2018-08-25: 0.25 mg via INTRAVENOUS

## 2018-08-25 MED ORDER — SODIUM CHLORIDE 0.9% FLUSH
10.0000 mL | INTRAVENOUS | Status: DC | PRN
  Administered 2018-08-25: 10 mL
  Filled 2018-08-25: qty 10

## 2018-08-25 MED ORDER — HEPARIN SOD (PORK) LOCK FLUSH 100 UNIT/ML IV SOLN
500.0000 [IU] | Freq: Once | INTRAVENOUS | Status: AC | PRN
  Administered 2018-08-25: 500 [IU]
  Filled 2018-08-25: qty 5

## 2018-08-25 MED ORDER — SODIUM CHLORIDE 0.9 % IV SOLN
Freq: Once | INTRAVENOUS | Status: AC
  Administered 2018-08-25: 13:00:00 via INTRAVENOUS
  Filled 2018-08-25: qty 5

## 2018-08-25 MED ORDER — SODIUM CHLORIDE 0.9% FLUSH
10.0000 mL | Freq: Once | INTRAVENOUS | Status: AC
  Administered 2018-08-25: 10 mL
  Filled 2018-08-25: qty 10

## 2018-08-25 MED ORDER — PALONOSETRON HCL INJECTION 0.25 MG/5ML
INTRAVENOUS | Status: AC
  Filled 2018-08-25: qty 5

## 2018-08-25 MED ORDER — SODIUM CHLORIDE 0.9 % IV SOLN
300.0000 mg | Freq: Once | INTRAVENOUS | Status: DC
  Filled 2018-08-25: qty 30

## 2018-08-25 MED ORDER — DIPHENHYDRAMINE HCL 50 MG/ML IJ SOLN
INTRAMUSCULAR | Status: AC
  Filled 2018-08-25: qty 1

## 2018-08-25 MED ORDER — SODIUM CHLORIDE 0.9 % IV SOLN
20.0000 mg | Freq: Once | INTRAVENOUS | Status: AC
  Administered 2018-08-25: 20 mg via INTRAVENOUS
  Filled 2018-08-25: qty 2

## 2018-08-25 NOTE — Patient Instructions (Signed)
[facility] Discharge Instructions for Patients Receiving Chemotherapy  Today you received the following chemotherapy agents Carboplatin (PARAPLATIN).  To help prevent nausea and vomiting after your treatment, we encourage you to take your nausea medication as prescribed. DO NOT TAKE ZOFRAN FOR THE NEXT 3 DAYS, TAKE COMPAZINE.   If you develop nausea and vomiting that is not controlled by your nausea medication, call the clinic.   BELOW ARE SYMPTOMS THAT SHOULD BE REPORTED IMMEDIATELY:  *FEVER GREATER THAN 100.5 F  *CHILLS WITH OR WITHOUT FEVER  NAUSEA AND VOMITING THAT IS NOT CONTROLLED WITH YOUR NAUSEA MEDICATION  *UNUSUAL SHORTNESS OF BREATH  *UNUSUAL BRUISING OR BLEEDING  TENDERNESS IN MOUTH AND THROAT WITH OR WITHOUT PRESENCE OF ULCERS  *URINARY PROBLEMS  *BOWEL PROBLEMS  UNUSUAL RASH Items with * indicate a potential emergency and should be followed up as soon as possible.  Feel free to call the clinic should you have any questions or concerns. The clinic phone number is [phone].  Please show the [facility] at check-in to the Emergency Department and triage nurse.   Neutropenia Neutropenia is a condition that occurs when you have a lower-than-normal level of a type of white blood cell (neutrophil) in your body. Neutrophils are made in the spongy center of large bones (bone marrow) and they fight infections. Neutrophils are your body's main defense against bacterial and fungal infections. The fewer neutrophils you have and the longer your body remains without them, the greater your risk of getting a severe infection. What are the causes? This condition can occur if your body uses up or destroys neutrophils faster than your bone marrow can make them. This problem may happen because of:  Bacterial or fungal infection.  Allergic disorders.  Reactions to some medicines.  Autoimmune disease.  An enlarged spleen. This condition can also  occur if your bone marrow does not produce enough neutrophils. This problem may be caused by:  Cancer.  Cancer treatments, such as radiation or chemotherapy.  Viral infections.  Medicines, such as phenytoin.  Vitamin B12 deficiency.  Diseases of the bone marrow.  Environmental toxins, such as insecticides. What are the signs or symptoms? This condition does not usually cause symptoms. If symptoms are present, they are usually caused by an underlying infection. Symptoms of an infection may include:  Fever.  Chills.  Swollen glands.  Oral or anal ulcers.  Cough and shortness of breath.  Rash.  Skin infection.  Fatigue. How is this diagnosed? Your health care provider may suspect neutropenia if you have:  A condition that may cause neutropenia.  Symptoms of infection, especially fever.  Frequent and unusual infections. You will have a medical history and physical exam. Tests will also be done, such as:  A complete blood count (CBC).  A procedure to collect a sample of bone marrow for examination (bone marrow biopsy).  A chest X-ray.  A urine culture.  A blood culture. How is this treated? Treatment depends on the underlying cause and severity of your condition. Mild neutropenia may not require treatment. Treatment may include medicines, such as:  Antibiotic medicine given through an IV tube.  Antiviral medicines.  Antifungal medicines.  A medicine to increase neutrophil production (colony-stimulating factor). You may get this drug through an IV tube or by injection.  Steroids given through an IV tube. If an underlying condition is causing neutropenia, you may need treatment for that condition. If medicines you are taking are causing neutropenia, your health care provider  may have you stop taking those medicines. Follow these instructions at home: Medicines   Take over-the-counter and prescription medicines only as told by your health care provider.   Get a seasonal flu shot (influenza vaccine). Lifestyle  Do not eat unpasteurized foods.Do not eat unwashed raw fruits or vegetables.  Avoid exposure to groups of people or children.  Avoid being around people who are sick.  Avoid being around dirt or dust, such as in construction areas or gardens.  Do not provide direct care for pets. Avoid animal droppings. Do not clean litter boxes and bird cages. Hygiene   Bathe daily.  Clean the area between the genitals and the anus (perineal area) after you urinate or have a bowel movement. If you are female, wipe from front to back.  Brush your teeth with a soft toothbrush before and after meals.  Do not use a razor that has a blade. Use an electric razor to remove hair.  Wash your hands often. Make sure others who come in contact with you also wash their hands. If soap and water are not available, use hand sanitizer. General instructions  Do not have sex unless your health care provider has approved.  Take actions to avoid cuts and burns. For example: ? Be cautious when you use knives. Always cut away from yourself. ? Keep knives in protective sheaths or guards when not in use. ? Use oven mitts when you cook with a hot stove, oven, or grill. ? Stand a safe distance away from open fires.  Avoid people who received a vaccine in the past 30 days if that vaccine contained a live version of the germ (live vaccine). You should not get a live vaccine. Common live vaccines are varicella, measles, mumps, and rubella.  Do not share food utensils.  Do not use tampons, enemas, or rectal suppositories unless your health care provider has approved.  Keep all appointments as told by your health care provider. This is important. Contact a health care provider if:  You have a fever.  You have chills or you start to shake.  You have: ? A sore throat. ? A warm, red, or tender area on your skin. ? A cough. ? Frequent or painful urination. ?  Vaginal discharge or itching.  You develop: ? Sores in your mouth or anus. ? Swollen lymph nodes. ? Red streaks on the skin. ? A rash.  You feel: ? Nauseous or you vomit. ? Very fatigued. ? Short of breath. This information is not intended to replace advice given to you by your health care provider. Make sure you discuss any questions you have with your health care provider. Document Released: 11/16/2001 Document Revised: 01/21/2018 Document Reviewed: 12/07/2014 Elsevier Interactive Patient Education  2019 [employer].

## 2018-08-25 NOTE — Telephone Encounter (Signed)
Gave avs and calendar ° °

## 2018-08-25 NOTE — Progress Notes (Signed)
OK to treat - Per Dr Alvy Bimler note it is okay to treat pt today with todays lab results

## 2018-08-26 ENCOUNTER — Encounter: Payer: Self-pay | Admitting: Hematology and Oncology

## 2018-08-26 LAB — CA 125: Cancer Antigen (CA) 125: 227 U/mL — ABNORMAL HIGH (ref 0.0–38.1)

## 2018-08-26 NOTE — Assessment & Plan Note (Signed)
So far, she tolerated treatment very well except for mild pancytopenia which she is not symptomatic Clinically, she have positive response to treatment with reduction of tumor marker I recommend repeat imaging study before her next visit Due to progressive pancytopenia, I plan to delay her next cycle by 1 week to allow more time for bone marrow recovery

## 2018-08-26 NOTE — Assessment & Plan Note (Addendum)
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 There is no contraindication to remain on antiplatelet agents or anticoagulants as long as the platelet is greater than 50,000. The patient and family are educated to watch out for signs and symptoms of infection, progressive anemia such as dizziness and shortness of breath as well as signs of bleeding.

## 2018-08-26 NOTE — Progress Notes (Signed)
[place] OFFICE PROGRESS NOTE  Patient Care Team: Monico Blitz, MD as PCP - General (Internal Medicine)  ASSESSMENT & PLAN:  Ovarian cancer on right [facility]) So far, she tolerated treatment very well except for mild pancytopenia which she is not symptomatic Clinically, she have positive response to treatment with reduction of tumor marker I recommend repeat imaging study before her next visit Due to progressive pancytopenia, I plan to delay her next cycle by 1 week to allow more time for bone marrow recovery  Acquired pancytopenia ([place]) She has mild progressive pancytopenia due to chemotherapy She is not symptomatic She does not need transfusion support I plan to reduce the dose of carboplatin further but we will proceed with treatment as scheduled I plan to give her an extra week of break before her next cycle of treatment to allow bone marrow recovery. There is no contraindication to remain on antiplatelet agents or anticoagulants as long as the platelet is greater than 50,000. The patient and family are educated to watch out for signs and symptoms of infection, progressive anemia such as dizziness and shortness of breath as well as signs of bleeding.    Orders Placed This Encounter  Procedures  . CT ABDOMEN PELVIS W CONTRAST    Standing Status:   Future    Standing Expiration Date:   08/26/2019    Order Specific Question:   If indicated for the ordered procedure, I authorize the administration of contrast media per Radiology protocol    Answer:   Yes    Order Specific Question:   Preferred imaging location?    Answer:   [facility]    Order Specific Question:   Radiology Contrast Protocol - do NOT remove file path    Answer:   [email or web address]  . Sample to Blood Bank    Standing Status:   Standing    Number of Occurrences:   33    Standing Expiration Date:   08/26/2019    INTERVAL HISTORY: Please see below for problem  oriented charting. She returns with her daughter for further follow-up She has been feeling well while on treatment No nausea, changes in bowel habits or bloating No neuropathy Denies recent infection, fever or chills The patient denies any recent signs or symptoms of bleeding such as spontaneous epistaxis, hematuria or hematochezia.   SUMMARY OF ONCOLOGIC HISTORY: Oncology History   Negative BRCA in tumor analysis and blood test     Ovarian cancer on right [facility])   12/07/2016 Imaging    Ct angiogram:  No evidence of pulmonary embolus.  Large left and small right pleural effusions. Complete collapse of the left lower lobe, partial collapse of the lingula and milder atelectasis of the left upper lobe, likely due to compressive atelectasis. The collapsed lung parenchyma is poorly evaluated, but no obvious pulmonary masses are seen.  Interstitial pulmonary edema.  Small volume abdominal ascites.  Heterogeneous appearance of the thyroid gland, with right sided nodules.  Aortic Atherosclerosis (ICD10-I70.0).    12/07/2016 - 12/13/2016 Hospital Admission    She was admitted for generalized malaise, abnormal blood work, large bilateral pleural effusion and possible right ovarian cancer    12/08/2016 Imaging    LV EF: 65% -   70%    12/08/2016 Pathology Results    PLEURAL FLUID, LEFT (SPECIMEN 1 OF 1, COLLECTED ON 12/08/16): - MALIGNANT CELLS CONSISTENT WITH METASTATIC ADENOCARCINOMA, SEE COMMENT    12/08/2016 Procedure    Successful ultrasound guided left thoracentesis yielding  1.6 L of pleural fluid. The procedure was terminated early secondary to significant coughing.    12/12/2016 Imaging    CT scan of abdomen and pelvis 1. Septated right adnexal cyst measuring 8.5 x 6.5 cm, concerning for ovarian malignancy in this setting. Small volume complex intra-abdominal ascites which tracks in the pericolic gutters and anterior omentum. No definite soft tissue omental caking. 2. Endometrial  thickening versus fluid in the endometrial canal, nonspecific. Rounded 3.9 cm lesion in the uterine fundus may be a fibroid, however would be unusual for patient's age. Endometrial/ uterine neoplasm is considered. 3. Nonspecific ill-defined low-density lesion in the right lobe of the liver. Probable hepatic hemangioma at the dome. 4. Further evaluation of the GYN structures could be performed with ultrasound. MRI may provide further detail if needed for potential surgical planning. 5. Aortic atherosclerosis    12/12/2016 Imaging    CXR 1. Progressive left base atelectasis and infiltrate. Small left pleural effusion again noted. 2.  Cardiomegaly, no evidence of pulmonary venous congestion.    12/13/2016 Tumor Marker    Patient's tumor was tested for the following markers: CA-125. Results of the tumor marker test revealed 1310.    2020/07/1416 Procedure    Ultrasound and fluoroscopically guided right internal jugular single lumen power port catheter insertion. Tip in the SVC/RA junction. Catheter ready for use.    01/01/2017 - 07/03/2017 Chemotherapy    She received carboplatin and Taxol x 5 cycles, interrupted in November for interval debulking surgery, and resumed to receive 3 more cycles from December to January 2019    01/07/2017 - 01/11/2017 Hospital Admission    She was admitted for severe nausea/vomiting and pleural effusion.    01/08/2017 Procedure    Successful ultrasound guided left thoracentesis yielding 1750 mL of pleural fluid    01/08/2017 Imaging    Marked improvement in left pleural effusion following thoracentesis. Negative for pneumothorax    01/22/2017 Adverse Reaction    Second dose of chemo requires significant dose adjustment and GCSF support    02/13/2017 Tumor Marker    Patient's tumor was tested for the following markers: CA-125. Results of the tumor marker test revealed 310.6    03/05/2017 Imaging    1. Persistent mild ventral peritoneal / omental nodularity in the LEFT  abdomen concerning for peritoneal carcinomatosis. 2. Interval reduction in free fluid the abdomen pelvis. 3. Slight decrease in volume of large RIGHT adnexal cystic mass. 4. Stable LEFT pleural effusion.    03/06/2017 Tumor Marker    Patient's tumor was tested for the following markers: CA-125. Results of the tumor marker test revealed 128.1    03/27/2017 Tumor Marker    Patient's tumor was tested for the following markers: CA-125. Results of the tumor marker test revealed 96.5    04/22/2017 Pathology Results    1. Uterus +/- tubes/ovaries, neoplastic - RIGHT OVARY: HIGH GRADE CARCINOMA, SPANNING 7 CM, SEE COMMENT. PSAMMOMATOUS DEPOSITS ON SURFACE. SEE ONCOLOGY TABLE. - LEFT MESO-OVARIUM: METASTATIC HIGH GRADE CARCINOMA. DEFINITIVE OVARIAN TISSUE NOT IDENTIFIED. - UTERUS: -ENDOMETRIUM: BENIGN ENDOMETRIAL TYPE POLYP. NO MALIGNANCY IDENTIFIED. -MYOMETRIUM: -SEROSA: PSAMMOMATOUS DEPOSITS. NO VIABLE TUMOR IDENTIFIED. - CERVIX: NO DYSPLASIA OR MALIGNANCY. - BILATERAL FALLOPIAN TUBES: RIGHT: METASTATIC HIGH GRADE CARCINOMA. PARATUBAL CYSTS. LEFT: PSAMMOMATOUS DEPOSITS. 2. Omentum, biopsy - METASTATIC HIGH GRADE CARCINOMA. Microscopic Comment 1. OVARY Specimen(s): Uterus, cervix, bilateral fallopian tubes, right ovary, omentum. Procedure: (including lymph node sampling): Total hysterectomy with bilateral salpingo-oophorectomy and omental biopsy. Primary tumor site (including laterality): Right ovary. Ovarian surface involvement:  Present. Ovarian capsule intact without fragmentation: Yes. Maximum tumor size (cm): 7 cm. Histologic type: High grade carcinoma, see comment. Grade: High grade. Peritoneal implants: (specify invasive or non-invasive): Present, invasive. Pelvic extension (list additional structures on separate lines and if involved): Right fallopian tube, left meso-ovarium, omentum (>2 cm deposit). Lymph nodes: number examined 0 ; number positive 0  TNM code: pT3c, pN0,  pMX FIGO Stage (based on pathologic findings, needs clinical correlation): IIIC Comments: The tumor consists of large papillary type excrescences with high grade tumor. There are clear cell features and the tumor is negative for WT-1 and p53, thus the tumor is favored to be a clear cell carcinoma or at least have had a component of clear cell carcinoma (residual after treatment). Dr. Lyndon Code has reviewed select slides    04/22/2017 Surgery    Pre-operative Diagnosis: stage IV ovarian cancer, s/p neoadjuvant chemotherapy  Post-operative Diagnosis: same  Operation: Robotic-assisted laparoscopic total hysterectomy with bilateral salpingoophorectomy, ex lap, omentectomy, mobilzation of splenic flexure, radical tumor debulking.  Surgeon: Donaciano Eva  Assistant Surgeon: Lahoma Crocker MD  Operative Findings:  : 7cm right ovarian cyst,  4cm tumor knot in left upper quadrant adherent to the splenic flexure of the colon and stomach.  No gross residual disease at the end of the procedure representing an optimal cytoreduction.      05/23/2017 Tumor Marker    Patient's tumor was tested for the following markers: CA-125. Results of the tumor marker test revealed 69.4    06/13/2017 Tumor Marker    Patient's tumor was tested for the following markers: CA-125. Results of the tumor marker test revealed 56    06/13/2017 Genetic Testing    Patient has genetic testing done for genetics with [facility] Results revealed patient has no mutations     07/03/2017 Tumor Marker    Patient's tumor was tested for the following markers: CA-125. Results of the tumor marker test revealed 51.1    07/17/2017 Tumor Marker    Patient's tumor was tested for the following markers: CA-125. Results of the tumor marker test revealed 43    09/27/2017 Tumor Marker    Patient's tumor was tested for the following markers: CA-125. Results of the tumor marker test revealed 30.1    12/30/2017 Tumor Marker     Patient's tumor was tested for the following markers: CA-125. Results of the tumor marker test revealed 31.5    03/15/2018 Tumor Marker    Patient's tumor was tested for the following markers: CA-125. Results of the tumor marker test revealed 36.2    07/09/2018 Tumor Marker    Patient's tumor was tested for the following markers: CA-125. Results of the tumor marker test revealed 532    07/09/2018 Imaging    1. Again noted is a large mass within the right adrenal bed which appears mixed in attenuation and has mass effect upon the upper pole of the right kidney and the IVC. The primary differential considerations are for either adrenal hemorrhage versus metastatic disease from patient's known ovarian carcinoma. A MRI of the adrenal glands may be helpful to assess the underlying enhancement characteristics of this lesion as well as assess for presence of blood products. 2. New left retroperitoneal lymph node is borderline enlarged measuring 1 cm. Additionally, in the left adnexal region there is a 3.1 x 2.0 cm soft tissue mass. In a patient who is status post bilateral oophorectomy for ovarian neoplasm this is suspicious for recurrence of disease. 3.  Aortic Atherosclerosis (  ICD10-I70.0).     07/15/2018 -  Chemotherapy    The patient had carboplatin only    07/15/2018 Tumor Marker    Patient's tumor was tested for the following markers: CA-125. Results of the tumor marker test revealed 226    08/05/2018 Tumor Marker    Patient's tumor was tested for the following markers: CA-125. Results of the tumor marker test revealed 382    08/25/2018 Tumor Marker    Patient's tumor was tested for the following markers: CA-125. Results of the tumor marker test revealed 227     REVIEW OF SYSTEMS:   Constitutional: Denies fevers, chills or abnormal weight loss Eyes: Denies blurriness of vision Ears, nose, mouth, throat, and face: Denies mucositis or sore throat Respiratory: Denies cough, dyspnea or  wheezes Cardiovascular: Denies palpitation, chest discomfort or lower extremity swelling Gastrointestinal:  Denies nausea, heartburn or change in bowel habits Skin: Denies abnormal skin rashes Lymphatics: Denies new lymphadenopathy or easy bruising Neurological:Denies numbness, tingling or new weaknesses Behavioral/Psych: Mood is stable, no new changes  All other systems were reviewed with the patient and are negative.  I have reviewed the past medical history, past surgical history, social history and family history with the patient and they are unchanged from previous note.  ALLERGIES:  is allergic to tetanus toxoids and codeine.  MEDICATIONS:  Current Outpatient Medications  Medication Sig Dispense Refill  . apixaban (ELIQUIS) 2.5 MG TABS tablet Take 2.5 mg by mouth 2 (two) times daily.    . Ascorbic Acid (VITAMIN C) 1000 MG tablet Take 1,000 mg by mouth daily.    . bisoprolol (ZEBETA) 10 MG tablet Take 10 mg by mouth daily.    . Cholecalciferol (VITAMIN D) 2000 units tablet Take 2,000 Units by mouth daily.    . furosemide (LASIX) 20 MG tablet Take 1 tablet (20 mg total) by mouth daily. 30 tablet 0  . Krill Oil 500 MG CAPS Take 500 mg by mouth daily.    Marland Kitchen lidocaine-prilocaine (EMLA) cream Apply to affected area once (Patient taking differently: Apply 1 application topically See admin instructions. Apply to affected area once) 30 g 3  . Multiple Vitamins-Minerals (MULTI FOR HER 50+) TABS Take 1 tablet by mouth daily.    . ondansetron (ZOFRAN) 4 MG tablet Take 4 mg by mouth every 8 (eight) hours as needed for nausea or vomiting.    . prochlorperazine (COMPAZINE) 10 MG tablet Take 1 tablet (10 mg total) by mouth every 6 (six) hours as needed (Nausea or vomiting). 30 tablet 1  . simvastatin (ZOCOR) 20 MG tablet Take 20 mg by mouth daily at 6 PM.      No current facility-administered medications for this visit.    Facility-Administered Medications Ordered in Other Visits  Medication Dose  Route Frequency Provider Last Rate Last Dose  . heparin lock flush 100 unit/mL  500 Units Intravenous Once Cross, Melissa D, NP      . sodium chloride flush (NS) 0.9 % injection 10 mL  10 mL Intravenous PRN Cross, Melissa D, NP        PHYSICAL EXAMINATION: ECOG PERFORMANCE STATUS: 1 - Symptomatic but completely ambulatory  Vitals:   08/25/18 1113  BP: (!) 125/49  Pulse: 65  Resp: 17  Temp: 97.7 F (36.5 C)  SpO2: 100%   Filed Weights   08/25/18 1113  Weight: 152 lb (68.9 kg)    GENERAL:alert, no distress and comfortable SKIN: skin color, texture, turgor are normal, no rashes or significant lesions  EYES: normal, Conjunctiva are pink and non-injected, sclera clear OROPHARYNX:no exudate, no erythema and lips, buccal mucosa, and tongue normal  NECK: supple, thyroid normal size, non-tender, without nodularity LYMPH:  no palpable lymphadenopathy in the cervical, axillary or inguinal LUNGS: clear to auscultation and percussion with normal breathing effort HEART: regular rate & rhythm and no murmurs and no lower extremity edema ABDOMEN:abdomen soft, non-tender and normal bowel sounds Musculoskeletal:no cyanosis of digits and no clubbing  NEURO: alert & oriented x 3 with fluent speech, no focal motor/sensory deficits  LABORATORY DATA:  I have reviewed the data as listed    Component Value Date/Time   NA 140 08/25/2018 1035   NA 137 06/13/2017 0925   K 4.3 08/25/2018 1035   K 3.9 06/13/2017 0925   CL 104 08/25/2018 1035   CO2 24 08/25/2018 1035   CO2 24 06/13/2017 0925   GLUCOSE 96 08/25/2018 1035   GLUCOSE 163 (H) 06/13/2017 0925   BUN 10 08/25/2018 1035   BUN 21.0 06/13/2017 0925   CREATININE 0.94 08/25/2018 1035   CREATININE 0.95 07/09/2018 0947   CREATININE 0.8 06/13/2017 0925   CALCIUM 9.8 08/25/2018 1035   CALCIUM 9.7 06/13/2017 0925   PROT 6.8 08/25/2018 1035   PROT 6.7 06/13/2017 0925   ALBUMIN 3.7 08/25/2018 1035   ALBUMIN 3.6 06/13/2017 0925   AST 22  08/25/2018 1035   AST 25 06/13/2017 0925   ALT 18 08/25/2018 1035   ALT 33 06/13/2017 0925   ALKPHOS 115 08/25/2018 1035   ALKPHOS 125 06/13/2017 0925   BILITOT 1.1 08/25/2018 1035   BILITOT 0.32 06/13/2017 0925   GFRNONAA 55 (L) 08/25/2018 1035   GFRNONAA 54 (L) 07/09/2018 0947   GFRAA >60 08/25/2018 1035   GFRAA >60 07/09/2018 0947    No results found for: SPEP, UPEP  Lab Results  Component Value Date   WBC 3.5 (L) 08/25/2018   NEUTROABS 1.4 (L) 08/25/2018   HGB 8.7 (L) 08/25/2018   HCT 26.2 (L) 08/25/2018   MCV 100.8 (H) 08/25/2018   PLT 97 (L) 08/25/2018      Chemistry      Component Value Date/Time   NA 140 08/25/2018 1035   NA 137 06/13/2017 0925   K 4.3 08/25/2018 1035   K 3.9 06/13/2017 0925   CL 104 08/25/2018 1035   CO2 24 08/25/2018 1035   CO2 24 06/13/2017 0925   BUN 10 08/25/2018 1035   BUN 21.0 06/13/2017 0925   CREATININE 0.94 08/25/2018 1035   CREATININE 0.95 07/09/2018 0947   CREATININE 0.8 06/13/2017 0925      Component Value Date/Time   CALCIUM 9.8 08/25/2018 1035   CALCIUM 9.7 06/13/2017 0925   ALKPHOS 115 08/25/2018 1035   ALKPHOS 125 06/13/2017 0925   AST 22 08/25/2018 1035   AST 25 06/13/2017 0925   ALT 18 08/25/2018 1035   ALT 33 06/13/2017 0925   BILITOT 1.1 08/25/2018 1035   BILITOT 0.32 06/13/2017 0925      All questions were answered. The patient knows to call the clinic with any problems, questions or concerns. No barriers to learning was detected.  I spent 25 minutes counseling the patient face to face. The total time spent in the appointment was 30 minutes and more than 50% was on counseling and review of test results  Heath Lark, MD 08/26/2018 7:52 AM

## 2018-09-14 ENCOUNTER — Other Ambulatory Visit: Payer: Medicare Other

## 2018-09-15 ENCOUNTER — Ambulatory Visit: Payer: Medicare Other | Admitting: Hematology and Oncology

## 2018-09-15 ENCOUNTER — Ambulatory Visit: Payer: Medicare Other

## 2018-09-21 ENCOUNTER — Ambulatory Visit (HOSPITAL_COMMUNITY)
Admission: RE | Admit: 2018-09-21 | Discharge: 2018-09-21 | Disposition: A | Discharge status: Home / Self Care | Payer: Medicare Other | Source: Ambulatory Visit | Attending: Hematology and Oncology | Admitting: Hematology and Oncology

## 2018-09-21 ENCOUNTER — Other Ambulatory Visit: Payer: Self-pay

## 2018-09-21 ENCOUNTER — Inpatient Hospital Stay: Payer: Medicare Other | Attending: Gynecologic Oncology

## 2018-09-21 ENCOUNTER — Inpatient Hospital Stay: Payer: Medicare Other

## 2018-09-21 DIAGNOSIS — C561 Malignant neoplasm of right ovary: Secondary | ICD-10-CM

## 2018-09-21 DIAGNOSIS — N329 Bladder disorder, unspecified: Secondary | ICD-10-CM | POA: Diagnosis not present

## 2018-09-21 DIAGNOSIS — D61818 Other pancytopenia: Secondary | ICD-10-CM | POA: Diagnosis not present

## 2018-09-21 DIAGNOSIS — F0390 Unspecified dementia without behavioral disturbance: Secondary | ICD-10-CM | POA: Insufficient documentation

## 2018-09-21 DIAGNOSIS — Z95828 Presence of other vascular implants and grafts: Secondary | ICD-10-CM

## 2018-09-21 LAB — COMPREHENSIVE METABOLIC PANEL
ALT: 15 U/L (ref 0–44)
AST: 21 U/L (ref 15–41)
Albumin: 3.9 g/dL (ref 3.5–5.0)
Alkaline Phosphatase: 101 U/L (ref 38–126)
Anion gap: 12 (ref 5–15)
BUN: 10 mg/dL (ref 8–23)
CO2: 25 mmol/L (ref 22–32)
Calcium: 9.8 mg/dL (ref 8.9–10.3)
Chloride: 102 mmol/L (ref 98–111)
Creatinine, Ser: 0.96 mg/dL (ref 0.44–1.00)
GFR calc Af Amer: >60 mL/min (ref >60)
GFR calc non Af Amer: 53 mL/min — ABNORMAL LOW (ref >60)
Glucose, Bld: 98 mg/dL (ref 70–99)
Potassium: 4 mmol/L (ref 3.5–5.1)
Sodium: 139 mmol/L (ref 135–145)
Total Bilirubin: 0.8 mg/dL (ref 0.3–1.2)
Total Protein: 7.1 g/dL (ref 6.5–8.1)

## 2018-09-21 LAB — CBC WITH DIFFERENTIAL/PLATELET
Abs Immature Granulocytes: 0.03 10*3/uL (ref 0.00–0.07)
Basophils Absolute: 0 10*3/uL (ref 0.0–0.1)
Basophils Relative: 1 %
Eosinophils Absolute: 0 10*3/uL (ref 0.0–0.5)
Eosinophils Relative: 0 %
HCT: 28.2 % — ABNORMAL LOW (ref 36.0–46.0)
Hemoglobin: 9.3 g/dL — ABNORMAL LOW (ref 12.0–15.0)
Immature Granulocytes: 1 %
Lymphocytes Relative: 49 %
Lymphs Abs: 1.7 10*3/uL (ref 0.7–4.0)
MCH: 36 pg — ABNORMAL HIGH (ref 26.0–34.0)
MCHC: 33 g/dL (ref 30.0–36.0)
MCV: 109.3 fL — ABNORMAL HIGH (ref 80.0–100.0)
Monocytes Absolute: 0.4 10*3/uL (ref 0.1–1.0)
Monocytes Relative: 11 %
Neutro Abs: 1.3 10*3/uL — ABNORMAL LOW (ref 1.7–7.7)
Neutrophils Relative %: 38 %
Platelets: 181 10*3/uL (ref 150–400)
RBC: 2.58 MIL/uL — ABNORMAL LOW (ref 3.87–5.11)
RDW: 20.8 % — ABNORMAL HIGH (ref 11.5–15.5)
WBC: 3.5 10*3/uL — ABNORMAL LOW (ref 4.0–10.5)
nRBC: 0 % (ref 0.0–0.2)

## 2018-09-21 LAB — SAMPLE TO BLOOD BANK

## 2018-09-21 MED ORDER — SODIUM CHLORIDE 0.9% FLUSH
10.0000 mL | Freq: Once | INTRAVENOUS | Status: AC
  Administered 2018-09-21: 10 mL
  Filled 2018-09-21: qty 10

## 2018-09-21 MED ORDER — IOHEXOL 300 MG/ML  SOLN
100.0000 mL | Freq: Once | INTRAMUSCULAR | Status: AC | PRN
  Administered 2018-09-21: 100 mL via INTRAVENOUS

## 2018-09-21 MED ORDER — HEPARIN SOD (PORK) LOCK FLUSH 100 UNIT/ML IV SOLN
500.0000 [IU] | Freq: Once | INTRAVENOUS | Status: AC
  Administered 2018-09-21: 11:00:00 500 [IU] via INTRAVENOUS

## 2018-09-21 MED ORDER — SODIUM CHLORIDE (PF) 0.9 % IJ SOLN
INTRAMUSCULAR | Status: AC
  Filled 2018-09-21: qty 50

## 2018-09-21 NOTE — Patient Instructions (Signed)

## 2018-09-21 NOTE — Progress Notes (Signed)
NT aware to remind staff/pt during scan check-in that she cannot go home with her port accessed today d/t not having the correct dressing.

## 2018-09-22 ENCOUNTER — Inpatient Hospital Stay (HOSPITAL_BASED_OUTPATIENT_CLINIC_OR_DEPARTMENT_OTHER): Payer: Medicare Other | Admitting: Hematology and Oncology

## 2018-09-22 ENCOUNTER — Other Ambulatory Visit: Payer: Self-pay

## 2018-09-22 ENCOUNTER — Encounter: Payer: Self-pay | Admitting: Hematology and Oncology

## 2018-09-22 ENCOUNTER — Inpatient Hospital Stay: Payer: Medicare Other

## 2018-09-22 DIAGNOSIS — C561 Malignant neoplasm of right ovary: Secondary | ICD-10-CM

## 2018-09-22 DIAGNOSIS — D61818 Other pancytopenia: Secondary | ICD-10-CM | POA: Diagnosis not present

## 2018-09-22 DIAGNOSIS — F0390 Unspecified dementia without behavioral disturbance: Secondary | ICD-10-CM | POA: Diagnosis not present

## 2018-09-22 DIAGNOSIS — Z7189 Other specified counseling: Secondary | ICD-10-CM

## 2018-09-22 DIAGNOSIS — N329 Bladder disorder, unspecified: Secondary | ICD-10-CM | POA: Diagnosis not present

## 2018-09-22 LAB — CA 125: Cancer Antigen (CA) 125: 113 U/mL — ABNORMAL HIGH (ref 0.0–38.1)

## 2018-09-22 NOTE — Progress Notes (Signed)
[facility] OFFICE PROGRESS NOTE  Patient Care Team: Monico Blitz, MD as PCP - General (Internal Medicine)  ASSESSMENT & PLAN:  Ovarian cancer on right [facility]) I have reviewed the imaging study with the patient and her daughter Due to the patient's background of dementia, her daughter, Shirlean Mylar is the dedicated healthcare power of attorney Unfortunately, she has new bladder lesion, likely represent disease progression We discussed the risk and benefits of biopsy We also discussed the risk and benefits of pursuing further palliative chemotherapy versus comfort care/hospice I will call her again tomorrow to review the plan of care with her daughter I will cancel her treatment today  Acquired pancytopenia [facility]) She has significant pancytopenia from recent treatment but not symptomatic.  She does not need transfusion support.  Goals of care, counseling/discussion I discussed the goals of care with the daughter over the telephone We discussed the risk and benefits of pursuing further chemotherapy versus referral to home-based palliative care/hospice She is undecided I will call her tomorrow for further decision   No orders of the defined types were placed in this encounter.   INTERVAL HISTORY: Please see below for problem oriented charting. She returns to review test results Currently, she is not symptomatic She denies pain Due to her dementia and limited understanding, no further history is obtained I spoke with her daughter, Shirlean Mylar over the telephone  SUMMARY OF ONCOLOGIC HISTORY: Oncology History   Negative BRCA in tumor analysis and blood test     Ovarian cancer on right [facility])   12/07/2016 Imaging    Ct angiogram:  No evidence of pulmonary embolus.  Large left and small right pleural effusions. Complete collapse of the left lower lobe, partial collapse of the lingula and milder atelectasis of the left upper lobe, likely due to compressive atelectasis. The  collapsed lung parenchyma is poorly evaluated, but no obvious pulmonary masses are seen.  Interstitial pulmonary edema.  Small volume abdominal ascites.  Heterogeneous appearance of the thyroid gland, with right sided nodules.  Aortic Atherosclerosis (ICD10-I70.0).    12/07/2016 - 12/13/2016 Hospital Admission    She was admitted for generalized malaise, abnormal blood work, large bilateral pleural effusion and possible right ovarian cancer    12/08/2016 Imaging    LV EF: 65% -   70%    12/08/2016 Pathology Results    PLEURAL FLUID, LEFT (SPECIMEN 1 OF 1, COLLECTED ON 12/08/16): - MALIGNANT CELLS CONSISTENT WITH METASTATIC ADENOCARCINOMA, SEE COMMENT    12/08/2016 Procedure    Successful ultrasound guided left thoracentesis yielding 1.6 L of pleural fluid. The procedure was terminated early secondary to significant coughing.    12/12/2016 Imaging    CT scan of abdomen and pelvis 1. Septated right adnexal cyst measuring 8.5 x 6.5 cm, concerning for ovarian malignancy in this setting. Small volume complex intra-abdominal ascites which tracks in the pericolic gutters and anterior omentum. No definite soft tissue omental caking. 2. Endometrial thickening versus fluid in the endometrial canal, nonspecific. Rounded 3.9 cm lesion in the uterine fundus may be a fibroid, however would be unusual for patient's age. Endometrial/ uterine neoplasm is considered. 3. Nonspecific ill-defined low-density lesion in the right lobe of the liver. Probable hepatic hemangioma at the dome. 4. Further evaluation of the GYN structures could be performed with ultrasound. MRI may provide further detail if needed for potential surgical planning. 5. Aortic atherosclerosis    12/12/2016 Imaging    CXR 1. Progressive left base atelectasis and infiltrate. Small left pleural effusion again  noted. 2.  Cardiomegaly, no evidence of pulmonary venous congestion.    12/13/2016 Tumor Marker    Patient's tumor was tested for the  following markers: CA-125. Results of the tumor marker test revealed 1310.    December 11, 202018 Procedure    Ultrasound and fluoroscopically guided right internal jugular single lumen power port catheter insertion. Tip in the SVC/RA junction. Catheter ready for use.    01/01/2017 - 07/03/2017 Chemotherapy    She received carboplatin and Taxol x 5 cycles, interrupted in November for interval debulking surgery, and resumed to receive 3 more cycles from December to January 2019    01/07/2017 - 01/11/2017 Hospital Admission    She was admitted for severe nausea/vomiting and pleural effusion.    01/08/2017 Procedure    Successful ultrasound guided left thoracentesis yielding 1750 mL of pleural fluid    01/08/2017 Imaging    Marked improvement in left pleural effusion following thoracentesis. Negative for pneumothorax    01/22/2017 Adverse Reaction    Second dose of chemo requires significant dose adjustment and GCSF support    02/13/2017 Tumor Marker    Patient's tumor was tested for the following markers: CA-125. Results of the tumor marker test revealed 310.6    03/05/2017 Imaging    1. Persistent mild ventral peritoneal / omental nodularity in the LEFT abdomen concerning for peritoneal carcinomatosis. 2. Interval reduction in free fluid the abdomen pelvis. 3. Slight decrease in volume of large RIGHT adnexal cystic mass. 4. Stable LEFT pleural effusion.    03/06/2017 Tumor Marker    Patient's tumor was tested for the following markers: CA-125. Results of the tumor marker test revealed 128.1    03/27/2017 Tumor Marker    Patient's tumor was tested for the following markers: CA-125. Results of the tumor marker test revealed 96.5    04/22/2017 Pathology Results    1. Uterus +/- tubes/ovaries, neoplastic - RIGHT OVARY: HIGH GRADE CARCINOMA, SPANNING 7 CM, SEE COMMENT. PSAMMOMATOUS DEPOSITS ON SURFACE. SEE ONCOLOGY TABLE. - LEFT MESO-OVARIUM: METASTATIC HIGH GRADE CARCINOMA. DEFINITIVE OVARIAN TISSUE  NOT IDENTIFIED. - UTERUS: -ENDOMETRIUM: BENIGN ENDOMETRIAL TYPE POLYP. NO MALIGNANCY IDENTIFIED. -MYOMETRIUM: -SEROSA: PSAMMOMATOUS DEPOSITS. NO VIABLE TUMOR IDENTIFIED. - CERVIX: NO DYSPLASIA OR MALIGNANCY. - BILATERAL FALLOPIAN TUBES: RIGHT: METASTATIC HIGH GRADE CARCINOMA. PARATUBAL CYSTS. LEFT: PSAMMOMATOUS DEPOSITS. 2. Omentum, biopsy - METASTATIC HIGH GRADE CARCINOMA. Microscopic Comment 1. OVARY Specimen(s): Uterus, cervix, bilateral fallopian tubes, right ovary, omentum. Procedure: (including lymph node sampling): Total hysterectomy with bilateral salpingo-oophorectomy and omental biopsy. Primary tumor site (including laterality): Right ovary. Ovarian surface involvement: Present. Ovarian capsule intact without fragmentation: Yes. Maximum tumor size (cm): 7 cm. Histologic type: High grade carcinoma, see comment. Grade: High grade. Peritoneal implants: (specify invasive or non-invasive): Present, invasive. Pelvic extension (list additional structures on separate lines and if involved): Right fallopian tube, left meso-ovarium, omentum (>2 cm deposit). Lymph nodes: number examined 0 ; number positive 0  TNM code: pT3c, pN0, pMX FIGO Stage (based on pathologic findings, needs clinical correlation): IIIC Comments: The tumor consists of large papillary type excrescences with high grade tumor. There are clear cell features and the tumor is negative for WT-1 and p53, thus the tumor is favored to be a clear cell carcinoma or at least have had a component of clear cell carcinoma (residual after treatment). Dr. Lyndon Code has reviewed select slides    04/22/2017 Surgery    Pre-operative Diagnosis: stage IV ovarian cancer, s/p neoadjuvant chemotherapy  Post-operative Diagnosis: same  Operation: Robotic-assisted laparoscopic total hysterectomy with bilateral salpingoophorectomy,  ex lap, omentectomy, mobilzation of splenic flexure, radical tumor debulking.  Surgeon: Donaciano Eva  Assistant Surgeon: Lahoma Crocker MD  Operative Findings:  : 7cm right ovarian cyst,  4cm tumor knot in left upper quadrant adherent to the splenic flexure of the colon and stomach.  No gross residual disease at the end of the procedure representing an optimal cytoreduction.      05/23/2017 Tumor Marker    Patient's tumor was tested for the following markers: CA-125. Results of the tumor marker test revealed 69.4    06/13/2017 Tumor Marker    Patient's tumor was tested for the following markers: CA-125. Results of the tumor marker test revealed 56    06/13/2017 Genetic Testing    Patient has genetic testing done for genetics with [facility] Results revealed patient has no mutations     07/03/2017 Tumor Marker    Patient's tumor was tested for the following markers: CA-125. Results of the tumor marker test revealed 51.1    07/17/2017 Tumor Marker    Patient's tumor was tested for the following markers: CA-125. Results of the tumor marker test revealed 43    09/27/2017 Tumor Marker    Patient's tumor was tested for the following markers: CA-125. Results of the tumor marker test revealed 30.1    12/30/2017 Tumor Marker    Patient's tumor was tested for the following markers: CA-125. Results of the tumor marker test revealed 31.5    03/15/2018 Tumor Marker    Patient's tumor was tested for the following markers: CA-125. Results of the tumor marker test revealed 36.2    07/09/2018 Tumor Marker    Patient's tumor was tested for the following markers: CA-125. Results of the tumor marker test revealed 532    07/09/2018 Imaging    1. Again noted is a large mass within the right adrenal bed which appears mixed in attenuation and has mass effect upon the upper pole of the right kidney and the IVC. The primary differential considerations are for either adrenal hemorrhage versus metastatic disease from patient's known ovarian carcinoma. A MRI of the adrenal glands may be helpful to  assess the underlying enhancement characteristics of this lesion as well as assess for presence of blood products. 2. New left retroperitoneal lymph node is borderline enlarged measuring 1 cm. Additionally, in the left adnexal region there is a 3.1 x 2.0 cm soft tissue mass. In a patient who is status post bilateral oophorectomy for ovarian neoplasm this is suspicious for recurrence of disease. 3.  Aortic Atherosclerosis (ICD10-I70.0).     07/15/2018 - 08/25/2018 Chemotherapy    The patient had carboplatin only x 3 cycles, stopped due to progression of disease/new progression disease    07/15/2018 Tumor Marker    Patient's tumor was tested for the following markers: CA-125. Results of the tumor marker test revealed 242    08/05/2018 Tumor Marker    Patient's tumor was tested for the following markers: CA-125. Results of the tumor marker test revealed 382    08/25/2018 Tumor Marker    Patient's tumor was tested for the following markers: CA-125. Results of the tumor marker test revealed 227    09/21/2018 Imaging    1. New plaque-like soft tissue mass involving the right anterior bladder wall. Differential diagnosis includes progressive metastatic disease or primary bladder carcinoma. Consider cystoscopy for further evaluation. 2. Stable left adnexal mass, consistent with neoplasm. 3. Stable large right suprarenal mass, suspicious for metastatic disease. 4. Stable 10 mm left  abdominal retroperitoneal lymph node.     09/21/2018 Tumor Marker    Patient's tumor was tested for the following markers: CA-125. Results of the tumor marker test revealed 113     REVIEW OF SYSTEMS:  Unreliable due to dementia  I have reviewed the past medical history, past surgical history, social history and family history and they are unchanged from previous note.  ALLERGIES:  is allergic to tetanus toxoids and codeine.  MEDICATIONS:  Current Outpatient Medications  Medication Sig Dispense Refill  . apixaban  (ELIQUIS) 2.5 MG TABS tablet Take 2.5 mg by mouth 2 (two) times daily.    . Ascorbic Acid (VITAMIN C) 1000 MG tablet Take 1,000 mg by mouth daily.    . bisoprolol (ZEBETA) 10 MG tablet Take 10 mg by mouth daily.    . Cholecalciferol (VITAMIN D) 2000 units tablet Take 2,000 Units by mouth daily.    . furosemide (LASIX) 20 MG tablet Take 1 tablet (20 mg total) by mouth daily. 30 tablet 0  . Krill Oil 500 MG CAPS Take 500 mg by mouth daily.    Marland Kitchen lidocaine-prilocaine (EMLA) cream Apply to affected area once (Patient taking differently: Apply 1 application topically See admin instructions. Apply to affected area once) 30 g 3  . Multiple Vitamins-Minerals (MULTI FOR HER 50+) TABS Take 1 tablet by mouth daily.    . ondansetron (ZOFRAN) 4 MG tablet Take 4 mg by mouth every 8 (eight) hours as needed for nausea or vomiting.    . prochlorperazine (COMPAZINE) 10 MG tablet Take 1 tablet (10 mg total) by mouth every 6 (six) hours as needed (Nausea or vomiting). 30 tablet 1  . simvastatin (ZOCOR) 20 MG tablet Take 20 mg by mouth daily at 6 PM.      No current facility-administered medications for this visit.    Facility-Administered Medications Ordered in Other Visits  Medication Dose Route Frequency Provider Last Rate Last Dose  . heparin lock flush 100 unit/mL  500 Units Intravenous Once Cross, Melissa D, NP      . sodium chloride flush (NS) 0.9 % injection 10 mL  10 mL Intravenous PRN Cross, Melissa D, NP        PHYSICAL EXAMINATION: ECOG PERFORMANCE STATUS: 1 - Symptomatic but completely ambulatory  Vitals:   09/22/18 1147  BP: (!) 114/46  Pulse: (!) 58  Resp: 18  Temp: 98.4 F (36.9 C)  SpO2: 100%   Filed Weights   09/22/18 1145 09/22/18 1147  Weight: (P) 149 lb 9.6 oz (67.9 kg) 149 lb 9.6 oz (67.9 kg)    GENERAL:alert, no distress and comfortable NEURO: alert but not oriented x 3 with fluent speech, no focal motor/sensory deficits  LABORATORY DATA:  I have reviewed the data as  listed    Component Value Date/Time   NA 139 09/21/2018 1015   NA 137 06/13/2017 0925   K 4.0 09/21/2018 1015   K 3.9 06/13/2017 0925   CL 102 09/21/2018 1015   CO2 25 09/21/2018 1015   CO2 24 06/13/2017 0925   GLUCOSE 98 09/21/2018 1015   GLUCOSE 163 (H) 06/13/2017 0925   BUN 10 09/21/2018 1015   BUN 21.0 06/13/2017 0925   CREATININE 0.96 09/21/2018 1015   CREATININE 0.95 07/09/2018 0947   CREATININE 0.8 06/13/2017 0925   CALCIUM 9.8 09/21/2018 1015   CALCIUM 9.7 06/13/2017 0925   PROT 7.1 09/21/2018 1015   PROT 6.7 06/13/2017 0925   ALBUMIN 3.9 09/21/2018 1015   ALBUMIN 3.6  06/13/2017 0925   AST 21 09/21/2018 1015   AST 25 06/13/2017 0925   ALT 15 09/21/2018 1015   ALT 33 06/13/2017 0925   ALKPHOS 101 09/21/2018 1015   ALKPHOS 125 06/13/2017 0925   BILITOT 0.8 09/21/2018 1015   BILITOT 0.32 06/13/2017 0925   GFRNONAA 53 (L) 09/21/2018 1015   GFRNONAA 54 (L) 07/09/2018 0947   GFRAA >60 09/21/2018 1015   GFRAA >60 07/09/2018 0947    No results found for: SPEP, UPEP  Lab Results  Component Value Date   WBC 3.5 (L) 09/21/2018   NEUTROABS 1.3 (L) 09/21/2018   HGB 9.3 (L) 09/21/2018   HCT 28.2 (L) 09/21/2018   MCV 109.3 (H) 09/21/2018   PLT 181 09/21/2018      Chemistry      Component Value Date/Time   NA 139 09/21/2018 1015   NA 137 06/13/2017 0925   K 4.0 09/21/2018 1015   K 3.9 06/13/2017 0925   CL 102 09/21/2018 1015   CO2 25 09/21/2018 1015   CO2 24 06/13/2017 0925   BUN 10 09/21/2018 1015   BUN 21.0 06/13/2017 0925   CREATININE 0.96 09/21/2018 1015   CREATININE 0.95 07/09/2018 0947   CREATININE 0.8 06/13/2017 0925      Component Value Date/Time   CALCIUM 9.8 09/21/2018 1015   CALCIUM 9.7 06/13/2017 0925   ALKPHOS 101 09/21/2018 1015   ALKPHOS 125 06/13/2017 0925   AST 21 09/21/2018 1015   AST 25 06/13/2017 0925   ALT 15 09/21/2018 1015   ALT 33 06/13/2017 0925   BILITOT 0.8 09/21/2018 1015   BILITOT 0.32 06/13/2017 0925        RADIOGRAPHIC STUDIES: I reviewed the imaging findings with the daughter I have personally reviewed the radiological images as listed and agreed with the findings in the report. Ct Abdomen Pelvis W Contrast  Result Date: 09/21/2018 CLINICAL DATA:  Follow-up metastatic ovarian carcinoma. Ongoing chemotherapy. Restaging. EXAM: CT ABDOMEN AND PELVIS WITH CONTRAST TECHNIQUE: Multidetector CT imaging of the abdomen and pelvis was performed using the standard protocol following bolus administration of intravenous contrast. CONTRAST:  170m OMNIPAQUE IOHEXOL 300 MG/ML  SOLN COMPARISON:  07/09/2018 FINDINGS: Lower Chest: No acute findings. Mild left pleural thickening again seen. Hepatobiliary: No intrahepatic masses identified. Gallbladder is unremarkable. Pancreas:  No mass or inflammatory changes. Spleen: Within normal limits in size and appearance. Adrenals/Urinary Tract: A heterogeneously enhancing right suprarenal mass measuring 7.7 x 7.6 cm shows no significant change. Normal appearance of left adrenal gland. No evidence of renal masses or hydronephrosis. New plaque-like soft tissue mass is seen involving the right anterior bladder wall, which measures approximately 4.2 x 1.7 cm. Stomach/Bowel: No evidence of obstruction, inflammatory process or abnormal fluid collections. Vascular/Lymphatic: 10 mm left paraaortic lymph node shows no significant change. No abdominal aortic aneurysm. Reproductive: Prior hysterectomy. Soft tissue mass in the left adnexa currently measures 3.1 x 1.9 cm, without significant change compared to prior study. No new or enlarging masses are identified. No evidence of ascites. Other:  None. Musculoskeletal:  No suspicious bone lesions identified. IMPRESSION: 1. New plaque-like soft tissue mass involving the right anterior bladder wall. Differential diagnosis includes progressive metastatic disease or primary bladder carcinoma. Consider cystoscopy for further evaluation. 2. Stable  left adnexal mass, consistent with neoplasm. 3. Stable large right suprarenal mass, suspicious for metastatic disease. 4. Stable 10 mm left abdominal retroperitoneal lymph node. Electronically Signed   By: JEarle GellM.D.   On: 09/21/2018 16:03  All questions were answered. The patient knows to call the clinic with any problems, questions or concerns. No barriers to learning was detected.  I spent 30 minutes counseling the patient face to face. The total time spent in the appointment was 40 minutes and more than 50% was on counseling and review of test results  Heath Lark, MD 09/22/2018 1:35 PM

## 2018-09-22 NOTE — Assessment & Plan Note (Signed)
She has significant pancytopenia from recent treatment but not symptomatic.  She does not need transfusion support.

## 2018-09-22 NOTE — Assessment & Plan Note (Signed)
I have reviewed the imaging study with the patient and her daughter Due to the patient's background of dementia, her daughter, Shirlean Mylar is the dedicated healthcare power of attorney Unfortunately, she has new bladder lesion, likely represent disease progression We discussed the risk and benefits of biopsy We also discussed the risk and benefits of pursuing further palliative chemotherapy versus comfort care/hospice I will call her again tomorrow to review the plan of care with her daughter I will cancel her treatment today

## 2018-09-22 NOTE — Assessment & Plan Note (Signed)
I discussed the goals of care with the daughter over the telephone We discussed the risk and benefits of pursuing further chemotherapy versus referral to home-based palliative care/hospice She is undecided I will call her tomorrow for further decision

## 2018-09-23 ENCOUNTER — Telehealth: Payer: Self-pay

## 2018-09-23 ENCOUNTER — Telehealth: Payer: Self-pay | Admitting: Hematology and Oncology

## 2018-09-23 NOTE — Telephone Encounter (Signed)
Called [facility] with referral. Talked with operator. Phone's and internet is down. They will call office back for referral information.

## 2018-09-23 NOTE — Telephone Encounter (Signed)
Cassandra called back. Given hospice referral. She will contact daughter Shirlean Mylar today.

## 2018-09-23 NOTE — Telephone Encounter (Signed)
I spoke with her daughter, Shirlean Mylar. We discussed the imaging study as well as prognosis given disease progression I explained to them why biopsy is not recommended We discussed the goals of care and prognosis Ultimately, she agree for referral for home-based palliative care/hospice

## 2018-12-24 IMAGING — DX DG CHEST 2V
2 series · 2 of 2 positions shown · non-contrast
Comparison: 12/01/2016

CLINICAL DATA: Dry cough for 1 week.

EXAM:
CHEST  2 VIEW

[chest pa]
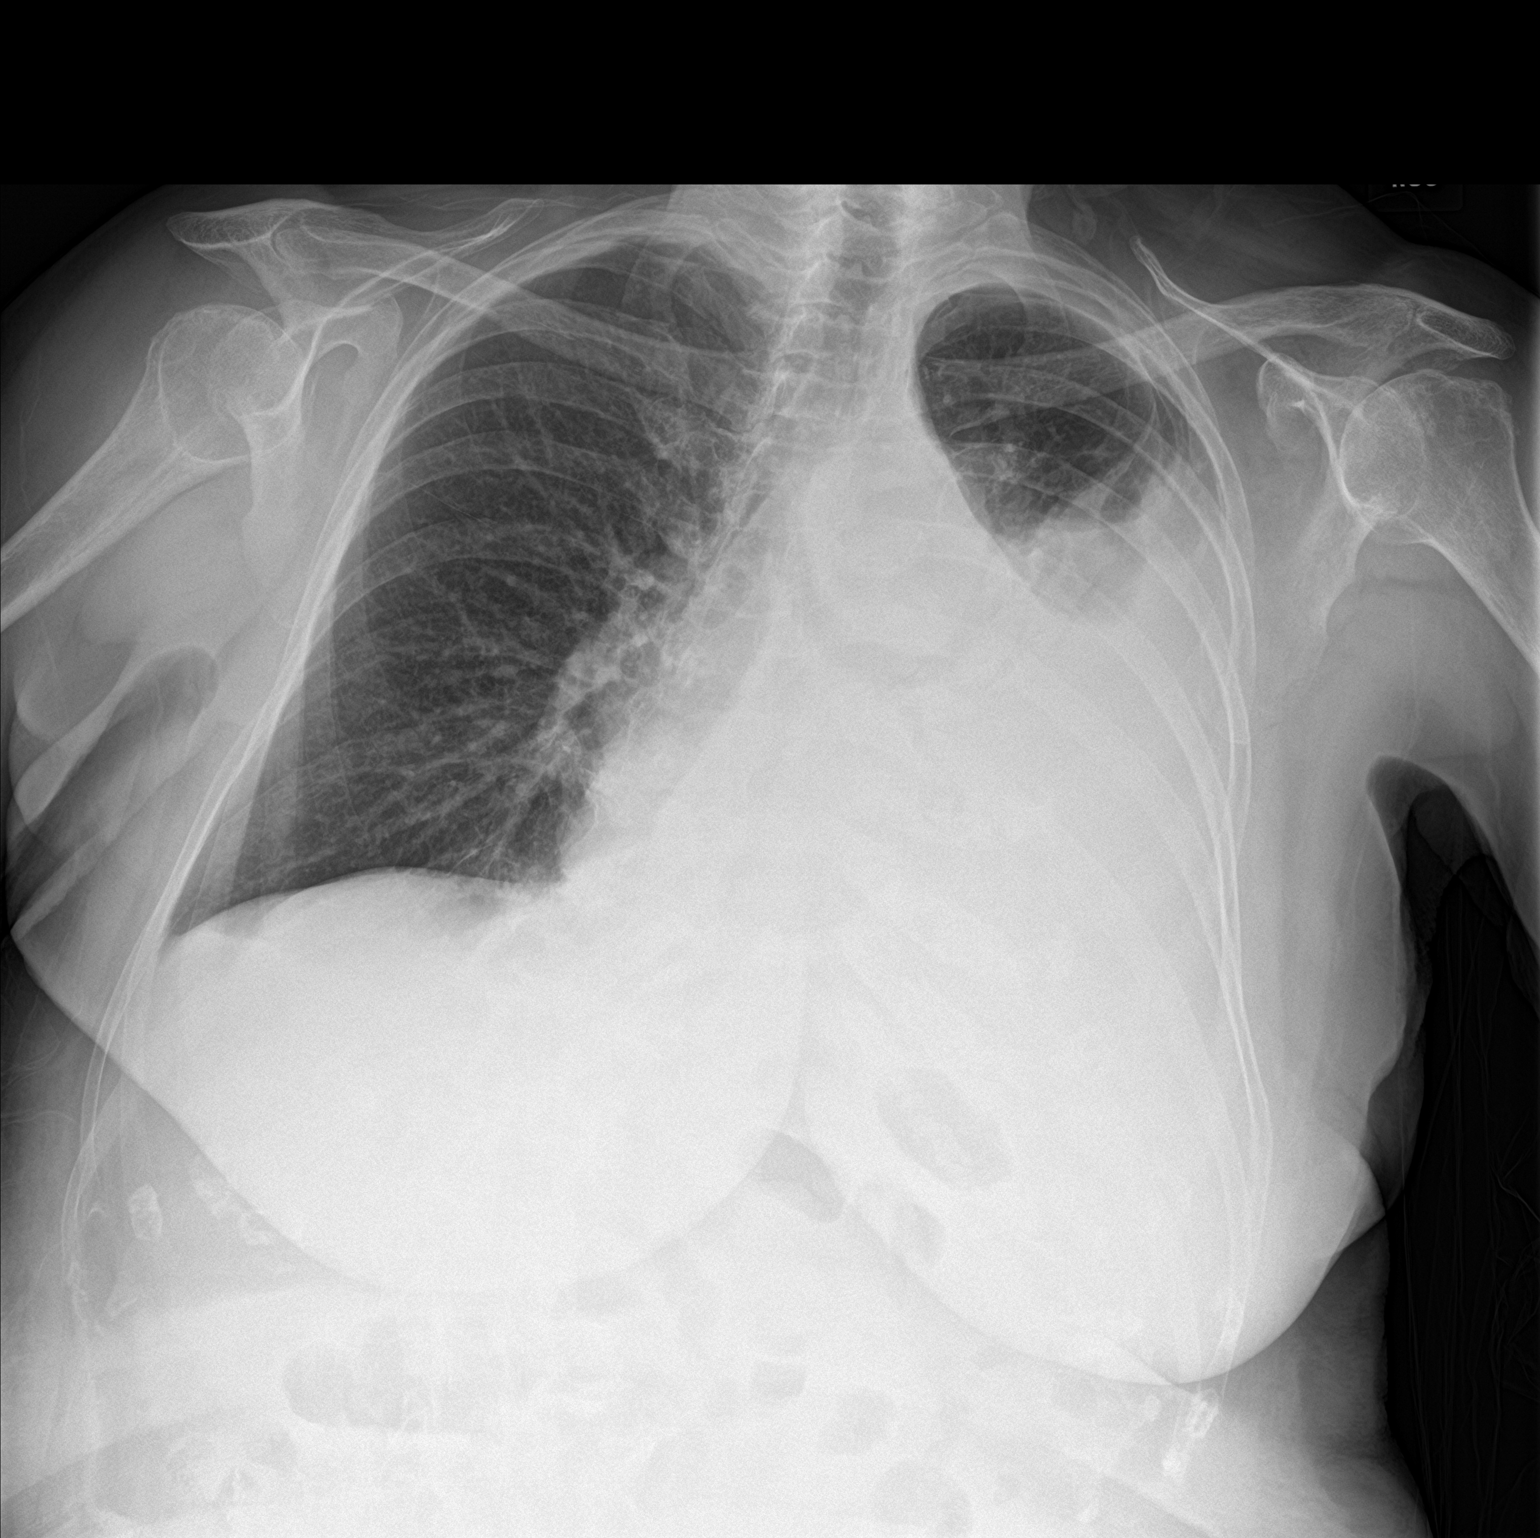

[chest lat]
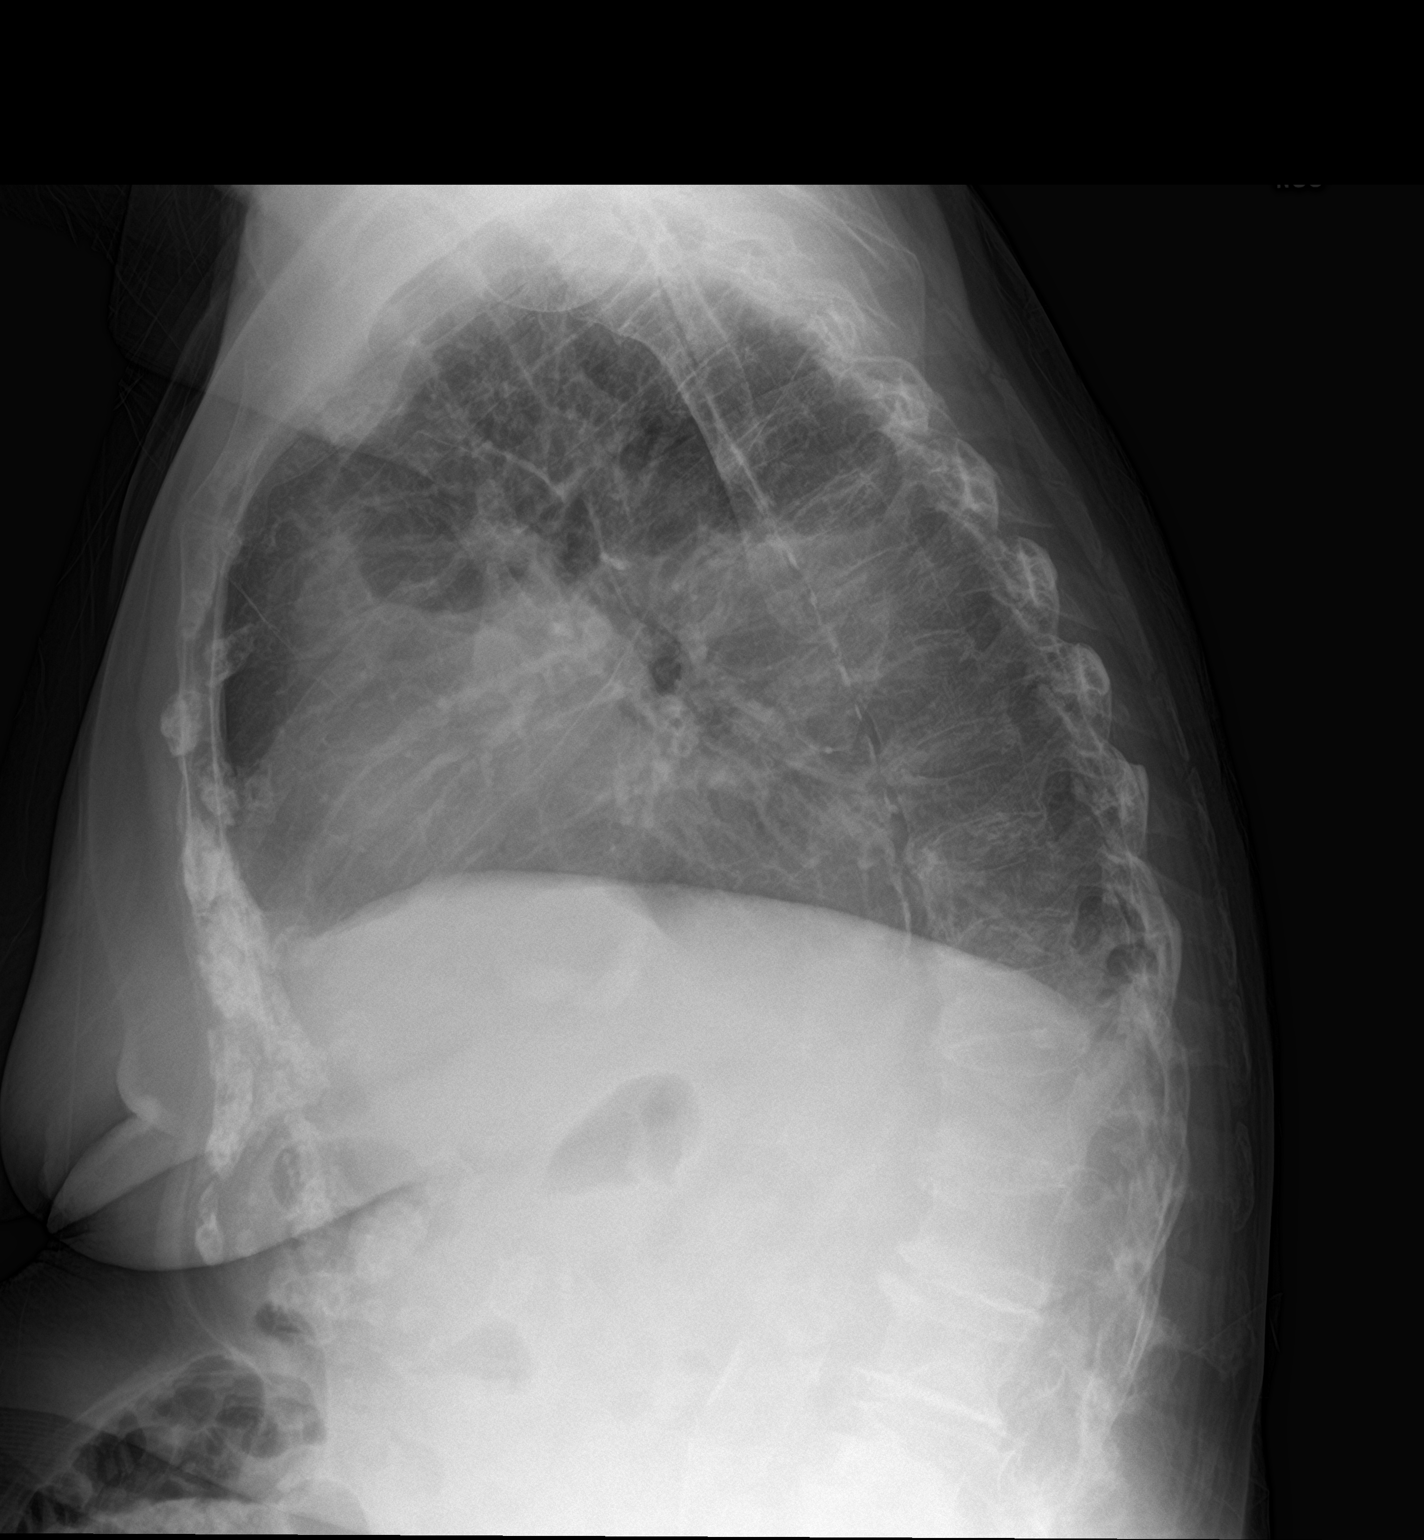

[2 of 2 positions shown; findings below may reference images not displayed]

FINDINGS: The cardiomediastinal silhouette is obscured by a large left pleural
effusion. Calcific atherosclerotic disease of the aorta.

No evidence of pneumothorax. The left pleural effusion has enlarged
when compared to the prior radiograph, occupying approximately [DATE]
of the left hemithorax.

Osteoarthritic changes of the spine and kyphotic deformity, with
mild chronic appearing compression deformity of multiple lower
thoracic and upper lumbar vertebral bodies. Soft tissues are grossly
normal.
IMPRESSION: Enlarged left pleural effusion. The left lung parenchyma is mostly
obscured by it.

Small right pleural effusion.  Right lung clear.

Calcific atherosclerotic disease of the aorta.

## 2018-12-25 IMAGING — DX DG CHEST 1V PORT
1 series · 1 of 1 positions shown · non-contrast
Comparison: 12/07/2016

CLINICAL DATA: Left pleural effusion

EXAM:
PORTABLE CHEST 1 VIEW

[chest ap]
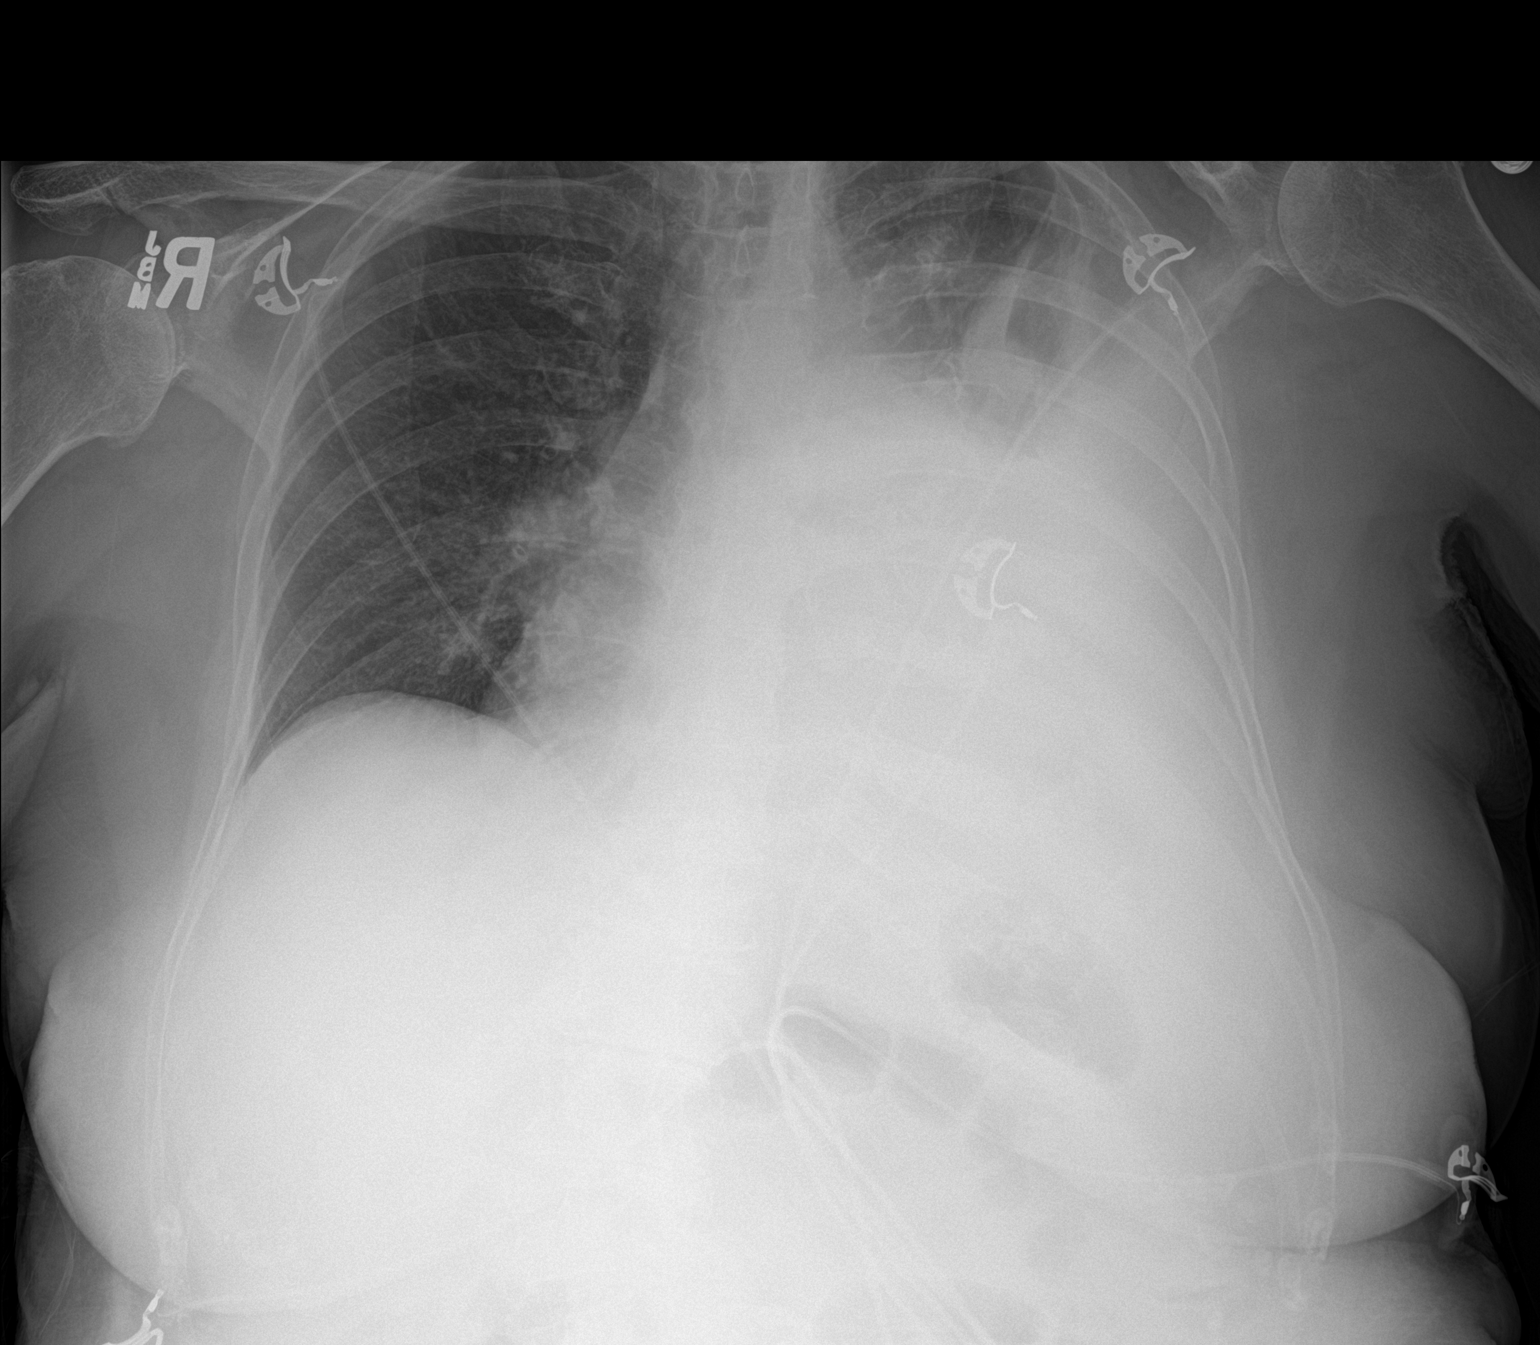

[1 of 1 positions shown; findings below may reference images not displayed]

FINDINGS: Large left pleural effusion with left lower lobe atelectasis,
unchanged. Probable cardiomegaly. Right lung is clear. No change
since prior study.
IMPRESSION: Stable large left pleural effusion with left lung atelectasis.

## 2018-12-29 IMAGING — DX DG CHEST 1V PORT
1 series · 1 of 1 positions shown · non-contrast
Comparison: 12/08/2016.

CLINICAL DATA: Follow-up left pleural effusion.

EXAM:
PORTABLE CHEST 1 VIEW

[chest ap]
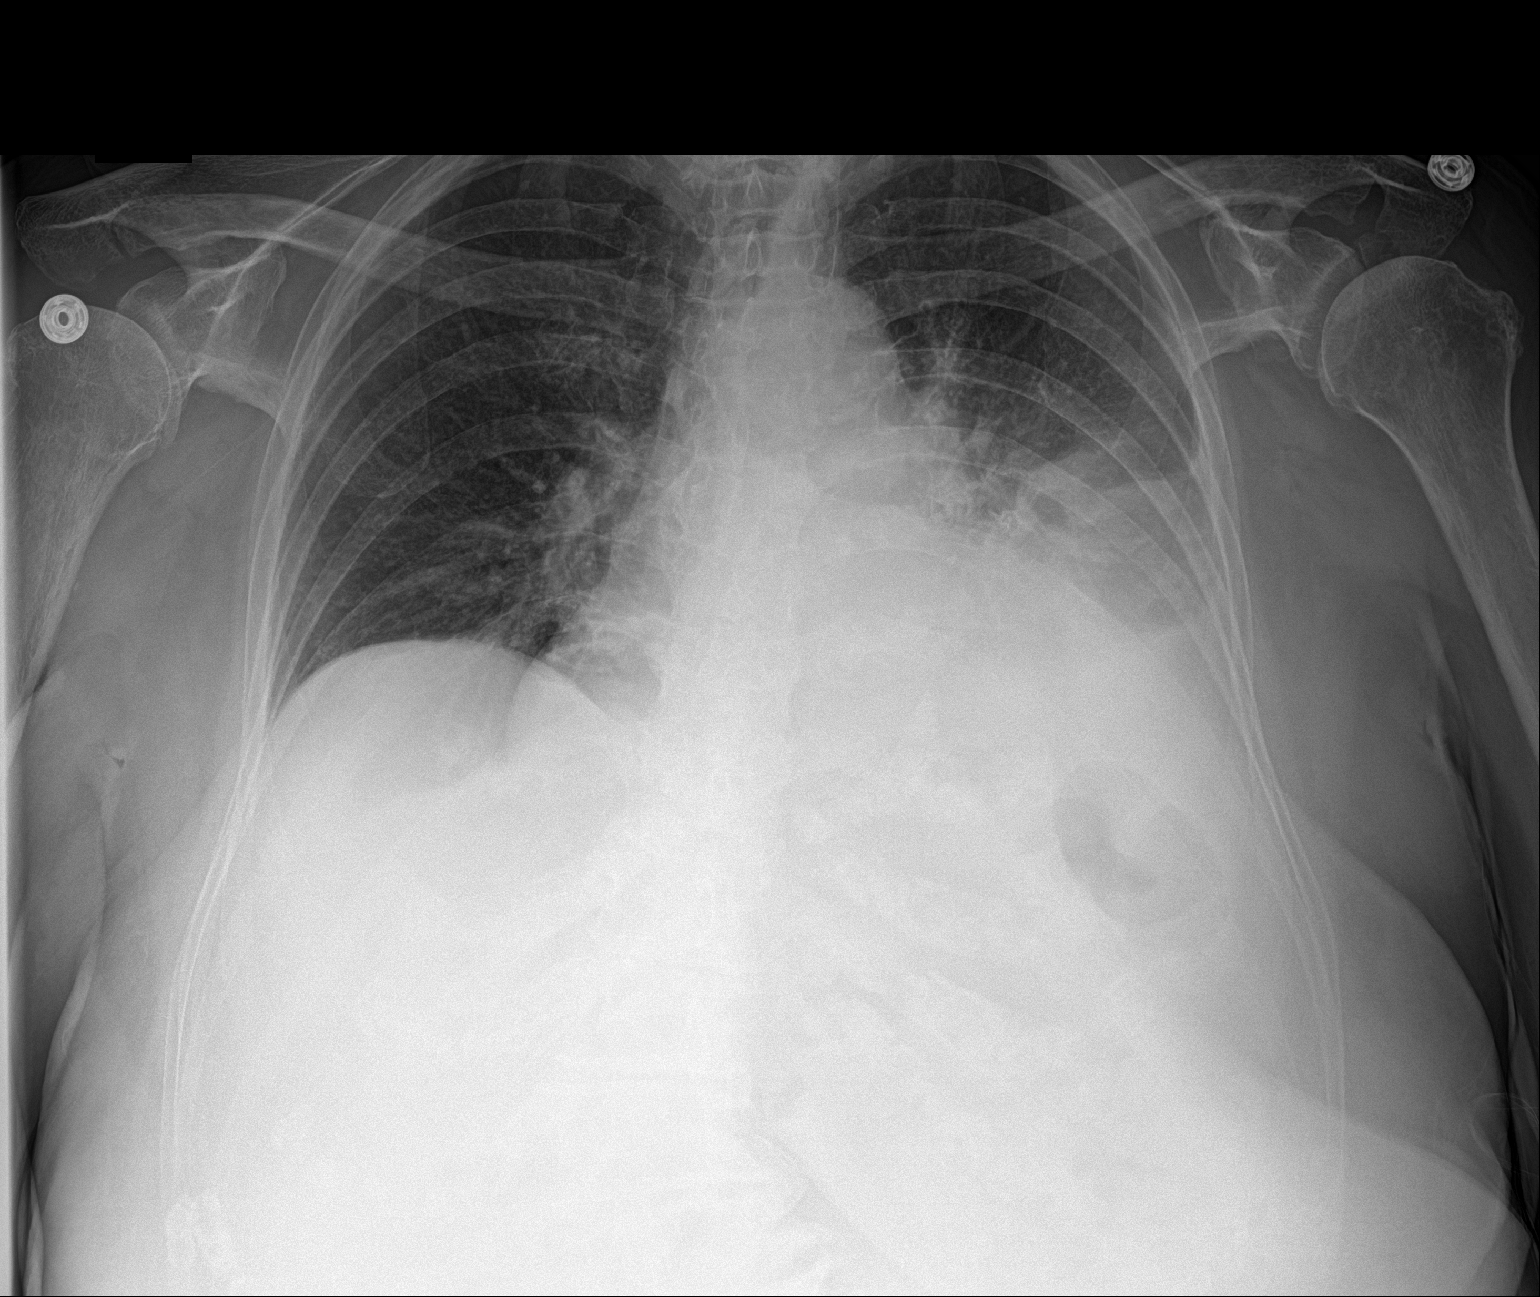

[1 of 1 positions shown; findings below may reference images not displayed]

FINDINGS: Cardiomegaly normal pulmonary vascularity. Progressive left base
atelectasis and infiltrate. Left base infiltrate cannot be excluded.
Small left pleural effusion is unchanged.
IMPRESSION: 1. Progressive left base atelectasis and infiltrate. Small left
pleural effusion again noted.

2.  Cardiomegaly, no evidence of pulmonary venous congestion.

## 2019-01-09 DEATH — deceased
